# Patient Record
Sex: Female | Born: 1970 | Race: White | Hispanic: No | Marital: Married | Smoking: Never smoker
Health system: Southern US, Community
[De-identification: ages and names within clinical notes are randomized; demographics above are authoritative.]

## PROBLEM LIST (undated history)

## (undated) DIAGNOSIS — R51 Headache: Secondary | ICD-10-CM

## (undated) DIAGNOSIS — G459 Transient cerebral ischemic attack, unspecified: Secondary | ICD-10-CM

## (undated) DIAGNOSIS — I10 Essential (primary) hypertension: Secondary | ICD-10-CM

## (undated) DIAGNOSIS — G988 Other disorders of nervous system: Secondary | ICD-10-CM

## (undated) DIAGNOSIS — R569 Unspecified convulsions: Secondary | ICD-10-CM

## (undated) DIAGNOSIS — E119 Type 2 diabetes mellitus without complications: Secondary | ICD-10-CM

## (undated) DIAGNOSIS — M797 Fibromyalgia: Secondary | ICD-10-CM

## (undated) DIAGNOSIS — IMO0001 Reserved for inherently not codable concepts without codable children: Secondary | ICD-10-CM

## (undated) DIAGNOSIS — R000 Tachycardia, unspecified: Secondary | ICD-10-CM

## (undated) DIAGNOSIS — K219 Gastro-esophageal reflux disease without esophagitis: Secondary | ICD-10-CM

## (undated) DIAGNOSIS — I499 Cardiac arrhythmia, unspecified: Secondary | ICD-10-CM

## (undated) DIAGNOSIS — G43109 Migraine with aura, not intractable, without status migrainosus: Secondary | ICD-10-CM

## (undated) HISTORY — PX: TUBAL LIGATION: SHX77

## (undated) HISTORY — PX: CHOLECYSTECTOMY: SHX55

## (undated) HISTORY — PX: COLONOSCOPY: SHX174

## (undated) HISTORY — PX: OVARIAN CYST DRAINAGE: SHX325

---

## 1999-11-09 ENCOUNTER — Emergency Department (HOSPITAL_COMMUNITY): Admission: EM | Admit: 1999-11-09 | Discharge: 1999-11-09 | Payer: Self-pay | Admitting: Emergency Medicine

## 2002-09-11 ENCOUNTER — Emergency Department (HOSPITAL_COMMUNITY): Admission: EM | Admit: 2002-09-11 | Discharge: 2002-09-11 | Payer: Self-pay | Admitting: *Deleted

## 2002-09-11 ENCOUNTER — Encounter: Payer: Self-pay | Admitting: Emergency Medicine

## 2004-04-04 ENCOUNTER — Emergency Department (HOSPITAL_COMMUNITY): Admission: EM | Admit: 2004-04-04 | Discharge: 2004-04-04 | Payer: Self-pay | Admitting: Emergency Medicine

## 2006-10-22 ENCOUNTER — Ambulatory Visit: Payer: Self-pay | Admitting: Cardiology

## 2008-07-15 DIAGNOSIS — G459 Transient cerebral ischemic attack, unspecified: Secondary | ICD-10-CM

## 2008-07-15 HISTORY — DX: Transient cerebral ischemic attack, unspecified: G45.9

## 2008-09-19 ENCOUNTER — Ambulatory Visit (HOSPITAL_COMMUNITY): Admission: RE | Admit: 2008-09-19 | Discharge: 2008-09-19 | Payer: Self-pay | Admitting: Neurology

## 2008-10-21 ENCOUNTER — Ambulatory Visit: Admission: RE | Admit: 2008-10-21 | Discharge: 2008-10-21 | Payer: Self-pay | Admitting: Neurology

## 2008-11-16 ENCOUNTER — Ambulatory Visit (HOSPITAL_COMMUNITY): Admission: RE | Admit: 2008-11-16 | Discharge: 2008-11-16 | Payer: Self-pay | Admitting: Pediatrics

## 2010-11-27 NOTE — Procedures (Signed)
Erin Murray, Erin Murray                 ACCOUNT NO.:  [id number]   MEDICAL RECORD NO.:  [id number]          PATIENT TYPE:  OUT   LOCATION:  SLEEP LAB                     FACILITY:  [facility]   PHYSICIAN:  Kofi A. Gerilyn Pilgrim, M.D. DATE OF BIRTH:  1970-09-11   DATE OF STUDY:  10/21/2008                            NOCTURNAL POLYSOMNOGRAM   REFERRING PHYSICIAN:  Kofi A. Gerilyn Pilgrim, M.D.   PLACE:  [facility].   REFERRING PHYSICIAN:  Kofi A. Gerilyn Pilgrim, MD   HISTORY OF PRESENT ILLNESS:  This is a 40 year old lady who presents  with hypersomnia and snoring.  She has been evaluated for obstructive  sleep apnea syndrome.   MEDICATIONS:  Clonazepam, Ambien, Paxil, vitamin D, and Lyrica.   EPWORTH SLEEPINESS SCALE:  1. BMI 35.   ARCHITECTURAL SUMMARY:  This is a diagnostic study.  The total recording  time is 403 minutes.  The sleep efficiency is 88%, sleep latency 45  minutes, and REM latency 247 minutes.  Stage N1 4.5%, N2 57.4%, N3  23.1%, and REM sleep 15%.   RESPIRATORY SUMMARY:  The baseline oxygen saturation is 98%, lowest  oxygen saturation is 87%.  AHI 0.2.   LIMB MOVEMENT SUMMARY:  PLM index 35.1.   ELECTROCARDIOGRAM SUMMARY:  Average heart rate 84 with no significant  dysrhythmias observed.   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      Kofi A. Gerilyn Pilgrim, M.D.  Electronically Signed     KAD/MEDQ  D:  10/25/2008 08:56:04  T:  10/26/2008 07:39:44  Job:  [id number]

## 2010-11-30 NOTE — Procedures (Signed)
Erin Murray, RUMLER                 ACCOUNT NO.:  [id number]   MEDICAL RECORD NO.:  [id number]          PATIENT TYPE:  OUT   LOCATION:  RESP                          FACILITY:  [facility]   PHYSICIAN:  Kofi A. Gerilyn Pilgrim, M.D. DATE OF BIRTH:  1970/11/26   DATE OF PROCEDURE:  DATE OF DISCHARGE:                              EEG INTERPRETATION   REFERRING PHYSICIAN:  Francoise Schaumann. Halm, DO, FAAP   INDICATION:  A 40 year old lady who presents with history of seizures.   MEDICATIONS:  Paxil, Ambien, Klonopin.   ANALYSIS:  A 16-channel recording is conducted for approximately 20  minutes.  There is a well-formed posterior dominant background rhythm of  11 Hz, which attenuates with eye opening.  There is beta activity  observed in the frontal areas.  Awake activity is recorded.  There is  some drowsiness also observed.  No clear stage II sleep, however.  Photic stimulation and hyperventilation are both carried out without  significant changes in the background activity.  There is no focal or  lateralized slowing.  The patient does have a few sharp wave activity,  stage reverse at T3 associated with a field.   IMPRESSION:  Mildly abnormal recording showing a few left temporal  epileptiform discharges.  These correlate clinically with partial onset  seizures.      Kofi A. Gerilyn Pilgrim, M.D.  Electronically Signed     KAD/MEDQ  D:  11/17/2008  T:  11/18/2008  Job:  [id number]

## 2012-01-02 ENCOUNTER — Other Ambulatory Visit (HOSPITAL_COMMUNITY): Payer: Self-pay | Admitting: Physician Assistant

## 2012-01-02 DIAGNOSIS — Z139 Encounter for screening, unspecified: Secondary | ICD-10-CM

## 2012-01-06 ENCOUNTER — Ambulatory Visit (HOSPITAL_COMMUNITY): Payer: Self-pay

## 2012-01-20 ENCOUNTER — Ambulatory Visit (HOSPITAL_COMMUNITY)
Admission: RE | Admit: 2012-01-20 | Discharge: 2012-01-20 | Disposition: A | Discharge status: Home / Self Care | Payer: PRIVATE HEALTH INSURANCE | Source: Ambulatory Visit | Attending: Physician Assistant | Admitting: Physician Assistant

## 2012-01-20 DIAGNOSIS — Z139 Encounter for screening, unspecified: Secondary | ICD-10-CM

## 2012-02-04 ENCOUNTER — Other Ambulatory Visit: Payer: Self-pay | Admitting: Physician Assistant

## 2012-02-04 DIAGNOSIS — R928 Other abnormal and inconclusive findings on diagnostic imaging of breast: Secondary | ICD-10-CM

## 2012-02-12 ENCOUNTER — Other Ambulatory Visit: Payer: Self-pay | Admitting: Physician Assistant

## 2012-02-12 ENCOUNTER — Ambulatory Visit (HOSPITAL_COMMUNITY)
Admission: RE | Admit: 2012-02-12 | Discharge: 2012-02-12 | Disposition: A | Discharge status: Home / Self Care | Payer: Self-pay | Source: Ambulatory Visit | Attending: Physician Assistant | Admitting: Physician Assistant

## 2012-02-12 DIAGNOSIS — R928 Other abnormal and inconclusive findings on diagnostic imaging of breast: Secondary | ICD-10-CM

## 2012-02-20 ENCOUNTER — Emergency Department (HOSPITAL_COMMUNITY)
Admission: EM | Admit: 2012-02-20 | Discharge: 2012-02-20 | Disposition: A | Discharge status: Home / Self Care | Payer: Self-pay | Attending: Emergency Medicine | Admitting: Emergency Medicine

## 2012-02-20 ENCOUNTER — Encounter (HOSPITAL_COMMUNITY): Payer: Self-pay | Admitting: *Deleted

## 2012-02-20 DIAGNOSIS — R531 Weakness: Secondary | ICD-10-CM

## 2012-02-20 DIAGNOSIS — R29898 Other symptoms and signs involving the musculoskeletal system: Secondary | ICD-10-CM | POA: Insufficient documentation

## 2012-02-20 DIAGNOSIS — Z8673 Personal history of transient ischemic attack (TIA), and cerebral infarction without residual deficits: Secondary | ICD-10-CM | POA: Insufficient documentation

## 2012-02-20 DIAGNOSIS — K219 Gastro-esophageal reflux disease without esophagitis: Secondary | ICD-10-CM | POA: Insufficient documentation

## 2012-02-20 HISTORY — DX: Transient cerebral ischemic attack, unspecified: G45.9

## 2012-02-20 HISTORY — DX: Unspecified convulsions: R56.9

## 2012-02-20 HISTORY — DX: Headache: R51

## 2012-02-20 HISTORY — DX: Reserved for inherently not codable concepts without codable children: IMO0001

## 2012-02-20 HISTORY — DX: Gastro-esophageal reflux disease without esophagitis: K21.9

## 2012-02-20 HISTORY — DX: Fibromyalgia: M79.7

## 2012-02-20 NOTE — ED Notes (Signed)
Pt has lt facial droop, says her lt arm "jerks" and weakness of lt leg.  Alert, talking, Has had similar episodes for several years.  This episode started yesterday 7pm while in church. Had been dx with migraine or TI A

## 2012-02-20 NOTE — ED Provider Notes (Signed)
History  This chart was scribed for Erin Hutching, MD by Bennett Scrape. This patient was seen in room [facility] and the patient's care was started at 4:08PM.  CSN: [id number]  Arrival date & time 02/20/12  1534   First MD Initiated Contact with Patient 02/20/12 1608      Chief Complaint  Patient presents with  . Extremity Weakness    The history is provided by the patient. No language interpreter was used.    Erin Murray is a 41 y.o. female who presents to the Emergency Department complaining of 21 hours of gradual onset, non-changing, constant left arm "jerking" with associated left leg weakness that started while she was in church. She states that she is able to walk but feels like she is dragging her left leg. She also states that the left side of her face feels tight and the right side feels "limp" but she denies facial drooping. She reports that she has been experiencing similar episodes for several years but states that they usually resolve on their own after a few hours. She states that she has been seen by several neurologists and reports that her symptoms have been diagnosed as seizures, TIAs and migraines. She states that she does have a h/o seizures but denies similarities between the seizures and the symptoms she is experiencing now. She also reports having 10 negative CT scans since the onset of the symptoms several years ago. She denies having any modifying factors. She denies fever, sore throat, visual disturbance, CP, SOB, abdominal pain, nausea, emesis, diarrhea, urinary symptoms, back pain, HA, numbness and rash as associated symptoms. She denies smoking and alcohol use.   PCP is [facility].   Past Medical History  Diagnosis Date  . Seizures   . Reflux   . Fibromyalgia   . Headache   . TIA (transient ischemic attack)     Past Surgical History  Procedure Date  . Ovarian cyst drainage   . Tubal ligation     History reviewed. No pertinent family  history.  History  Substance Use Topics  . Smoking status: Never Smoker   . Smokeless tobacco: Not on file  . Alcohol Use: No    No OB history provided.  Review of Systems  A complete 10 system review of systems was obtained and all systems are negative except as noted in the HPI and PMH.   Allergies  Penicillins and Sulfa antibiotics  Home Medications  No current outpatient prescriptions on file.  Triage Vitals: BP 134/84  Pulse 85  Temp 98.2 F (36.8 C) (Oral)  Resp 20  Ht 5\' 5"  (1.651 m)  Wt 220 lb (99.791 kg)  BMI 36.61 kg/m2  SpO2 100%  LMP 01/30/2012  Physical Exam  Nursing note and vitals reviewed. Constitutional: She is oriented to person, place, and time. She appears well-developed and well-nourished. No distress.  HENT:  Head: Normocephalic and atraumatic.  Eyes: Conjunctivae and EOM are normal.  Neck: Neck supple. No tracheal deviation present.  Cardiovascular: Normal rate and regular rhythm.   Pulmonary/Chest: Effort normal and breath sounds normal. No respiratory distress.  Musculoskeletal: Normal range of motion.  Neurological: She is alert and oriented to person, place, and time.       Normal gait, no facial droop  Skin: Skin is warm and dry.  Psychiatric: She has a normal mood and affect. Her behavior is normal.    ED Course  Procedures (including critical care time)  DIAGNOSTIC STUDIES: Oxygen  Saturation is 100% on room air, normal by my interpretation.    COORDINATION OF CARE: 4:17PM-Discussed treatment plan which includes a referral to Dr. Gerilyn Pilgrim with pt at bedside and pt agreed to plan. Advised pt that she needs to follow up with Dr. Gerilyn Pilgrim to further investigate her symptoms   Labs Reviewed - No data to display No results found.   No diagnosis found.    MDM  Patient has had these symptoms off and on for 10 years with multiple negative scans. She is ambulatory today. I discussed the risk in repetitive head scans. She  understands. We'll follow up c [facility]. Is alert, oriented, no acute findings on physical exam  I personally performed the services described in this documentation, which was scribed in my presence. The recorded information has been reviewed and considered.        Erin Hutching, MD 02/20/12 1728

## 2012-06-05 ENCOUNTER — Emergency Department (HOSPITAL_COMMUNITY)
Admission: EM | Admit: 2012-06-05 | Discharge: 2012-06-05 | Disposition: A | Discharge status: Home / Self Care | Payer: Self-pay | Attending: Emergency Medicine | Admitting: Emergency Medicine

## 2012-06-05 ENCOUNTER — Emergency Department (HOSPITAL_COMMUNITY): Payer: Self-pay

## 2012-06-05 ENCOUNTER — Encounter (HOSPITAL_COMMUNITY): Payer: Self-pay | Admitting: *Deleted

## 2012-06-05 DIAGNOSIS — W1789XA Other fall from one level to another, initial encounter: Secondary | ICD-10-CM | POA: Insufficient documentation

## 2012-06-05 DIAGNOSIS — Y9239 Other specified sports and athletic area as the place of occurrence of the external cause: Secondary | ICD-10-CM | POA: Insufficient documentation

## 2012-06-05 DIAGNOSIS — S4352XA Sprain of left acromioclavicular joint, initial encounter: Secondary | ICD-10-CM

## 2012-06-05 DIAGNOSIS — Y9344 Activity, trampolining: Secondary | ICD-10-CM | POA: Insufficient documentation

## 2012-06-05 DIAGNOSIS — K219 Gastro-esophageal reflux disease without esophagitis: Secondary | ICD-10-CM | POA: Insufficient documentation

## 2012-06-05 DIAGNOSIS — S63502A Unspecified sprain of left wrist, initial encounter: Secondary | ICD-10-CM

## 2012-06-05 DIAGNOSIS — Z79899 Other long term (current) drug therapy: Secondary | ICD-10-CM | POA: Insufficient documentation

## 2012-06-05 DIAGNOSIS — S4350XA Sprain of unspecified acromioclavicular joint, initial encounter: Secondary | ICD-10-CM | POA: Insufficient documentation

## 2012-06-05 DIAGNOSIS — G459 Transient cerebral ischemic attack, unspecified: Secondary | ICD-10-CM | POA: Insufficient documentation

## 2012-06-05 DIAGNOSIS — S63509A Unspecified sprain of unspecified wrist, initial encounter: Secondary | ICD-10-CM | POA: Insufficient documentation

## 2012-06-05 MED ORDER — IBUPROFEN 800 MG PO TABS
800.0000 mg | ORAL_TABLET | Freq: Once | ORAL | Status: AC
  Administered 2012-06-05: 800 mg via ORAL
  Filled 2012-06-05: qty 1

## 2012-06-05 NOTE — ED Notes (Signed)
Pt presents after falling from trampoline this evening, causing pain to left wrist, left shoulder and neck. Pt denies LOC.  No deformities noted.

## 2012-06-05 NOTE — ED Notes (Signed)
Fell off trampoline, Pain , neck, lt shoulder and lt arm.  No loc.  Ambulatory.

## 2012-06-05 NOTE — ED Provider Notes (Signed)
History     CSN: [id number]  Arrival date & time 06/05/12  1741   First MD Initiated Contact with Patient 06/05/12 1835      Chief Complaint  Patient presents with  . Fall    (Consider location/radiation/quality/duration/timing/severity/associated sxs/prior treatment) Patient is a 41 y.o. female presenting with fall. The history is provided by the patient. No language interpreter was used.  Fall The accident occurred 1 to 2 hours ago. The fall occurred while recreating/playing (pt fell while trying to get down from the trampoline in  her yard.  fell onto her L side). Distance fallen: ~ 3 feet. She landed on grass. There was no blood loss. The point of impact was the left shoulder and left wrist. The pain is present in the left shoulder and left wrist.    Past Medical History  Diagnosis Date  . Reflux   . Fibromyalgia   . Headache   . TIA (transient ischemic attack)   . Seizures     Past Surgical History  Procedure Date  . Ovarian cyst drainage   . Tubal ligation     History reviewed. No pertinent family history.  History  Substance Use Topics  . Smoking status: Never Smoker   . Smokeless tobacco: Not on file  . Alcohol Use: No    OB History    Grav Para Term Preterm Abortions TAB SAB Ect Mult Living                  Review of Systems  Allergies  Sulfa antibiotics and Penicillins  Home Medications   Current Outpatient Rx  Name  Route  Sig  Dispense  Refill  . VITAMIN D3 10000 UNITS PO CAPS   Oral   Take 20,000 Units by mouth once a week.         . CYANOCOBALAMIN 1000 MCG/ML IJ SOLN   Intramuscular   Inject 1,000 mcg into the muscle.         Marland Kitchen OMEPRAZOLE 20 MG PO CPDR   Oral   Take 20 mg by mouth at bedtime.         Marland Kitchen PREGABALIN 50 MG PO CAPS   Oral   Take 50 mg by mouth at bedtime. PRESCRIBED: Take one capsule by mouth twice daily         . PROPRANOLOL HCL 20 MG PO TABS   Oral   Take 20 mg by mouth at bedtime.           BP  130/85  Pulse 88  Temp 98.7 F (37.1 C) (Oral)  Resp 18  Ht 5\' 5"  (1.651 m)  Wt 214 lb (97.07 kg)  BMI 35.61 kg/m2  SpO2 100%  LMP 05/15/2012  Physical Exam  Musculoskeletal:       Left shoulder: She exhibits decreased range of motion, tenderness, bony tenderness and pain. She exhibits no swelling, no effusion, no crepitus, no deformity and normal pulse.       Left wrist: She exhibits decreased range of motion, tenderness and bony tenderness. She exhibits no swelling, no effusion, no crepitus, no deformity and no laceration.       Arms:   ED Course  Procedures (including critical care time)  Labs Reviewed - No data to display Dg Wrist Complete Left  06/05/2012  *RADIOLOGY REPORT*  Clinical Data: Larey Seat  LEFT WRIST - COMPLETE 3+ VIEW  Comparison: 10/18/2011  Findings: Carpal rows intact. Negative for fracture, dislocation, or other acute abnormality.  Normal alignment  and mineralization. No significant degenerative change.  Regional soft tissues unremarkable.  IMPRESSION:  Negative   Original Report Authenticated By: D. Andria Rhein, MD    Dg Shoulder Left  06/05/2012  *RADIOLOGY REPORT*  Clinical Data: Fall from trampoline with left shoulder injury.  LEFT SHOULDER - 2+ VIEW  Comparison: None.  Findings: No acute fracture or dislocation identified.  There is suggestion of mildly increased coracoclavicular distance without overt AC joint separation.  This may be consistent with a subtle sprain.  Mild proliferative changes are seen involving the acromion.  IMPRESSION: Possible subtle AC joint sprain.  Otherwise no evidence of acute fracture.   Original Report Authenticated By: Irish Lack, M.D.      1. Sprain of left acromioclavicular joint   2. Left wrist sprain       MDM  Wrist splint, sling, ice, ibuprofen. Pt states she will f/u with an orthopedist that her family uses.        Evalina Field, [place] 06/05/12 2012

## 2012-06-05 NOTE — ED Provider Notes (Signed)
Medical screening examination/treatment/procedure(s) were performed by non-physician practitioner and as supervising physician I was immediately available for consultation/collaboration.   Shelda Jakes, MD 06/05/12 2032

## 2012-11-02 ENCOUNTER — Ambulatory Visit: Payer: Self-pay | Admitting: Neurology

## 2012-11-03 ENCOUNTER — Ambulatory Visit: Payer: BC Managed Care – PPO | Admitting: Neurology

## 2012-11-17 ENCOUNTER — Encounter: Payer: Self-pay | Admitting: Neurology

## 2012-11-17 ENCOUNTER — Ambulatory Visit (INDEPENDENT_AMBULATORY_CARE_PROVIDER_SITE_OTHER): Payer: BC Managed Care – PPO | Admitting: Neurology

## 2012-11-17 VITALS — BP 123/82 | HR 83 | Ht 65.0 in | Wt 219.0 lb

## 2012-11-17 DIAGNOSIS — M797 Fibromyalgia: Secondary | ICD-10-CM

## 2012-11-17 DIAGNOSIS — K219 Gastro-esophageal reflux disease without esophagitis: Secondary | ICD-10-CM

## 2012-11-17 DIAGNOSIS — R519 Headache, unspecified: Secondary | ICD-10-CM | POA: Insufficient documentation

## 2012-11-17 DIAGNOSIS — G459 Transient cerebral ischemic attack, unspecified: Secondary | ICD-10-CM | POA: Insufficient documentation

## 2012-11-17 DIAGNOSIS — IMO0001 Reserved for inherently not codable concepts without codable children: Secondary | ICD-10-CM

## 2012-11-17 DIAGNOSIS — R51 Headache: Secondary | ICD-10-CM | POA: Insufficient documentation

## 2012-11-17 DIAGNOSIS — G43909 Migraine, unspecified, not intractable, without status migrainosus: Secondary | ICD-10-CM | POA: Insufficient documentation

## 2012-11-17 DIAGNOSIS — R569 Unspecified convulsions: Secondary | ICD-10-CM

## 2012-11-17 NOTE — Progress Notes (Signed)
42 yo right-handed Caucasian female, accompanied by her husband, referred by her primary care physician Dr. Quentin Mulling for evaluation of constellation of complaints.  In 2010, she had episode of confusion, got lost in her familiar route, run out of gas, she was also found to have slurred speech, lasting for 3-4 hours mild unsteady gait was diagnosed with TIA.   She also had episode of left-sided weakness, numbness, mild gait difficulty dizziness in 2010, has to use cane for few days.   in March 2014, she had sudden onset left-sided numbness, involving her face, body, arm, leg, also has mild left side weakness, gait difficulty lasting for more than 24 hours  She had history of migraine since 2005, see flashing light in the right visual field sometimes, followed by severe pounding headaches with associated light noise sensitivity, nauseous.   She compalins of  extreme faigue,   urinary incontinence for one year, she wet her patns few times,she has sharp pain at the ball of her feet.  She has shoulder blader stabbing pain.   Review of Systems  Out of a complete 14 system review, the patient complains of only the following symptoms, and all other reviewed systems are negative.   Constitutional:   Fatigue Cardiovascular:  N/A Ear/Nose/Throat:  N/A Skin: N/A Eyes: N/A Respiratory: N/A Gastroitestinal: N/A    Hematology/Lymphatic:  N/A Endocrine:  N/A Musculoskeletal: Joints pain, joint swelling, cramps, aching muscles Allergy/Immunology: N/A Neurological: Memory loss, confusion, headaches, numbness, weakness, slurred speech difficulty swallowing dizziness seizure, tremor  Psychiatric:    N/A  PHYSICAL EXAMINATOINS:  Generalized: In no acute distress  Neck: Supple, no carotid bruits   Cardiac: Regular rate rhythm  Pulmonary: Clear to auscultation bilaterally  Musculoskeletal: No deformity  Neurological examination  Mentation: Alert oriented to time, place, history taking, and  causual conversation  Cranial nerve II-XII: Pupils were equal round reactive to light extraocular movements were full, visual field were full on confrontational test. facial sensation and strength were normal. hearing was intact to finger rubbing bilaterally. Uvula tongue midline.  head turning and shoulder shrug and were normal and symmetric.Tongue protrusion into cheek strength was normal.  Motor: mild left arm proximal arm weakness 5-/5, left hip flexion, left knee extension 5-  Sensory: Intact to fine touch, pinprick, preserved vibratory sensation, and proprioception at toes.  Coordination: Normal finger to nose, heel-to-shin bilaterally there was no truncal ataxia  Gait: Rising up from seated position without assistance, normal stance, without trunk ataxia, moderate stride, good arm swing, smooth turning, able to perform tiptoe, and heel walking without difficulty.   Romberg signs: Negative  Deep tendon reflexes: Brachioradialis 2/2, biceps 2/2, triceps 2/2, patellar 2/2, Achilles 2/2, plantar responses were flexor bilaterally.  A/P: 42 yo RH WF with constellation of neurological symptoms, including dizziness, left-sided numbness, weakness  1. Need to rule out central nervous system structural lesion, MRI of the brain without contrast 2. laboratory evaluation. 3. return to clinic in one month.

## 2012-11-18 LAB — COMPREHENSIVE METABOLIC PANEL
Albumin/Globulin Ratio: 1.6 (ref 1.1–2.5)
Calcium: 9.4 mg/dL (ref 8.7–10.2)
Creatinine, Ser: 0.69 mg/dL (ref 0.57–1.00)
GFR calc Af Amer: 124 mL/min/{1.73_m2} (ref >59)
GFR calc non Af Amer: 108 mL/min/{1.73_m2} (ref >59)
Globulin, Total: 2.7 g/dL (ref 1.5–4.5)
Glucose: 89 mg/dL (ref 65–99)
Total Bilirubin: 0.1 mg/dL (ref 0.0–1.2)
Total Protein: 7 g/dL (ref 6.0–8.5)

## 2012-11-18 LAB — RPR: RPR: NONREACTIVE

## 2012-11-18 LAB — CBC
Hemoglobin: 12.6 g/dL (ref 11.1–15.9)
MCV: 82 fL (ref 79–97)
Platelets: 341 10*3/uL (ref 155–379)
RBC: 4.68 x10E6/uL (ref 3.77–5.28)
WBC: 7.5 10*3/uL (ref 3.4–10.8)

## 2012-11-18 LAB — HEPATITIS PANEL, ACUTE
Hep A IgM: NEGATIVE
Hep B C IgM: NEGATIVE
Hepatitis B Surface Ag: NEGATIVE

## 2012-11-18 LAB — TSH: TSH: 2.75 u[IU]/mL (ref 0.450–4.500)

## 2012-11-18 LAB — C-REACTIVE PROTEIN: CRP: 5 mg/L — ABNORMAL HIGH (ref 0.0–4.9)

## 2012-12-21 ENCOUNTER — Ambulatory Visit: Payer: BC Managed Care – PPO | Admitting: Neurology

## 2012-12-24 ENCOUNTER — Ambulatory Visit (INDEPENDENT_AMBULATORY_CARE_PROVIDER_SITE_OTHER): Payer: BC Managed Care – PPO

## 2012-12-24 DIAGNOSIS — G459 Transient cerebral ischemic attack, unspecified: Secondary | ICD-10-CM

## 2012-12-24 DIAGNOSIS — R51 Headache: Secondary | ICD-10-CM

## 2012-12-24 DIAGNOSIS — G43909 Migraine, unspecified, not intractable, without status migrainosus: Secondary | ICD-10-CM

## 2012-12-24 DIAGNOSIS — IMO0001 Reserved for inherently not codable concepts without codable children: Secondary | ICD-10-CM

## 2012-12-24 DIAGNOSIS — M797 Fibromyalgia: Secondary | ICD-10-CM

## 2012-12-24 DIAGNOSIS — R569 Unspecified convulsions: Secondary | ICD-10-CM

## 2012-12-25 MED ORDER — GADOPENTETATE DIMEGLUMINE 469.01 MG/ML IV SOLN
20.0000 mL | Freq: Once | INTRAVENOUS | Status: AC | PRN
Start: 2012-12-25 — End: 2012-12-25

## 2012-12-28 ENCOUNTER — Telehealth: Payer: Self-pay | Admitting: *Deleted

## 2012-12-28 NOTE — Progress Notes (Signed)
Quick Note:  Spoke with patient and relayed the results of their MRI. The patient was also reminded of any future appointments. The patient understood and had no questions.  ______ 

## 2012-12-28 NOTE — Telephone Encounter (Signed)
Spoke with patient and relayed the results of their MRI.  The patient was also reminded of any future appointments.  The patient understood and had no questions.

## 2012-12-30 ENCOUNTER — Ambulatory Visit (INDEPENDENT_AMBULATORY_CARE_PROVIDER_SITE_OTHER): Payer: BC Managed Care – PPO | Admitting: Neurology

## 2012-12-30 ENCOUNTER — Encounter: Payer: Self-pay | Admitting: Neurology

## 2012-12-30 VITALS — BP 114/79 | HR 95 | Ht 65.0 in | Wt 215.0 lb

## 2012-12-30 DIAGNOSIS — R569 Unspecified convulsions: Secondary | ICD-10-CM

## 2012-12-30 DIAGNOSIS — R51 Headache: Secondary | ICD-10-CM

## 2012-12-30 DIAGNOSIS — G43909 Migraine, unspecified, not intractable, without status migrainosus: Secondary | ICD-10-CM

## 2012-12-30 MED ORDER — RIZATRIPTAN BENZOATE 10 MG PO TBDP: 10.0000 mg | ORAL_TABLET | ORAL | Status: DC | PRN

## 2012-12-30 MED ORDER — TOPIRAMATE 50 MG PO TABS: 100.0000 mg | ORAL_TABLET | Freq: Two times a day (BID) | ORAL | Status: DC

## 2012-12-30 NOTE — Progress Notes (Signed)
42 yo right-handed Caucasian female, accompanied by her husband, referred by her primary care physician Dr. Quentin Mulling for evaluation of constellation of complaints.  In 2010, she had episode of confusion, got lost in her familiar route, run out of gas, she was also found to have slurred speech, lasting for 3-4 hours mild unsteady gait was diagnosed with TIA.   She also had episode of left-sided weakness, numbness, mild gait difficulty dizziness in 2010, has to use cane for few days.   in March 2014, she had sudden onset left-sided numbness, involving her face, body, arm, leg, also has mild left side weakness, gait difficulty lasting for more than 24 hours  She had history of migraine since 2005, see flashing light in the right visual field sometimes, followed by severe pounding headaches with associated light noise sensitivity, nauseous.   She compalins of  extreme faigue,   urinary incontinence for one year, she wet her patns few times,she has sharp pain at the ball of her feet.  She has shoulder blader stabbing pain.   UPDATE June 18th 2014:  She continues to have spells, most of the time, it is associated with headaches, left side numbness, occasionally weakness, each episodes lasting for 1-2 days,  She has headaches at least once every 2 weeks, she sees sparks in her visual field, flashing light in her right visual field, lasting one hour, followed by severe pounding headaches, mostly at right side parietal, occipital region, she drags her left side, with left side numbness,   Trigger for her headaches are menstruation, stress.  She was given imitrex prn for headaches, it took away headaches, but it makes her sick, nauseous,   MRI of the brain was normal, laboratory showed a normal CMP, TSH, CBC, vitamin B12, ANA, negative RPR  Review of Systems  Out of a complete 14 system review, the patient complains of only the following symptoms, and all other reviewed systems are negative.    Constitutional:   Fatigue Cardiovascular:  N/A Ear/Nose/Throat:  Ringing in ear, spinning sensation Skin: N/A Eyes: N/A Respiratory: N/A Gastroitestinal: N/A    Hematology/Lymphatic:  N/A Endocrine:  N/A Musculoskeletal: Joints pain, joint swelling, cramps, aching muscles Allergy/Immunology: N/A Neurological: Memory loss, confusion, headaches, numbness, weakness, slurred speech difficulty swallowing dizziness seizure, tremor  Psychiatric:    N/A  PHYSICAL EXAMINATOINS:  Generalized: In no acute distress  Neck: Supple, no carotid bruits   Cardiac: Regular rate rhythm  Pulmonary: Clear to auscultation bilaterally  Musculoskeletal: No deformity  Neurological examination  Mentation: Alert oriented to time, place, history taking, and causual conversation  Cranial nerve II-XII: Pupils were equal round reactive to light extraocular movements were full, visual field were full on confrontational test. facial sensation and strength were normal. hearing was intact to finger rubbing bilaterally. Uvula tongue midline.  head turning and shoulder shrug and were normal and symmetric.Tongue protrusion into cheek strength was normal.  Motor: mild left arm proximal arm weakness 5-/5, left hip flexion, left knee extension 5-  Sensory: Intact to fine touch, pinprick, preserved vibratory sensation, and proprioception at toes.  Coordination: Normal finger to nose, heel-to-shin bilaterally there was no truncal ataxia  Gait: Rising up from seated position without assistance, normal stance, without trunk ataxia, moderate stride, good arm swing, smooth turning, able to perform tiptoe, and heel walking without difficulty.   Romberg signs: Negative  Deep tendon reflexes: Brachioradialis 2/2, biceps 2/2, triceps 2/2, patellar 2/2, Achilles 2/2, plantar responses were flexor bilaterally.  A/P: 42 yo  RH WF with constellation of neurological symptoms, including dizziness, left-sided numbness,  weakness, most time her symptoms are associated with headaches, normal neurological examination, normal MRI of the brain,  1. Most consistent with a complicated migraine headaches 2. Topamax 50 mg titrating to 2 tablets twice a day 3. Maxalt as needed 4.  return to clinic in 4 months with Eber Jones .

## 2013-06-01 ENCOUNTER — Ambulatory Visit: Payer: BC Managed Care – PPO | Admitting: Neurology

## 2013-10-11 ENCOUNTER — Encounter (HOSPITAL_COMMUNITY): Payer: Self-pay | Admitting: Emergency Medicine

## 2013-10-11 ENCOUNTER — Emergency Department (HOSPITAL_COMMUNITY)
Admission: EM | Admit: 2013-10-11 | Discharge: 2013-10-11 | Disposition: A | Discharge status: Home / Self Care | Payer: BC Managed Care – PPO | Attending: Emergency Medicine | Admitting: Emergency Medicine

## 2013-10-11 ENCOUNTER — Emergency Department (HOSPITAL_COMMUNITY): Payer: BC Managed Care – PPO

## 2013-10-11 DIAGNOSIS — G43909 Migraine, unspecified, not intractable, without status migrainosus: Secondary | ICD-10-CM | POA: Insufficient documentation

## 2013-10-11 DIAGNOSIS — Z880 Allergy status to penicillin: Secondary | ICD-10-CM | POA: Insufficient documentation

## 2013-10-11 DIAGNOSIS — Z79899 Other long term (current) drug therapy: Secondary | ICD-10-CM | POA: Insufficient documentation

## 2013-10-11 DIAGNOSIS — R5383 Other fatigue: Secondary | ICD-10-CM

## 2013-10-11 DIAGNOSIS — K219 Gastro-esophageal reflux disease without esophagitis: Secondary | ICD-10-CM | POA: Insufficient documentation

## 2013-10-11 DIAGNOSIS — F411 Generalized anxiety disorder: Secondary | ICD-10-CM | POA: Insufficient documentation

## 2013-10-11 DIAGNOSIS — G40909 Epilepsy, unspecified, not intractable, without status epilepticus: Secondary | ICD-10-CM | POA: Insufficient documentation

## 2013-10-11 DIAGNOSIS — Z8739 Personal history of other diseases of the musculoskeletal system and connective tissue: Secondary | ICD-10-CM | POA: Insufficient documentation

## 2013-10-11 DIAGNOSIS — R5381 Other malaise: Secondary | ICD-10-CM | POA: Insufficient documentation

## 2013-10-11 DIAGNOSIS — Z8673 Personal history of transient ischemic attack (TIA), and cerebral infarction without residual deficits: Secondary | ICD-10-CM | POA: Insufficient documentation

## 2013-10-11 HISTORY — DX: Migraine with aura, not intractable, without status migrainosus: G43.109

## 2013-10-11 LAB — BASIC METABOLIC PANEL
BUN: 16 mg/dL (ref 6–23)
CHLORIDE: 104 meq/L (ref 96–112)
CO2: 23 meq/L (ref 19–32)
CREATININE: 0.78 mg/dL (ref 0.50–1.10)
Calcium: 9.5 mg/dL (ref 8.4–10.5)
GFR calc non Af Amer: >90 mL/min (ref >90)
Glucose, Bld: 87 mg/dL (ref 70–99)
POTASSIUM: 3.9 meq/L (ref 3.7–5.3)
Sodium: 140 mEq/L (ref 137–147)

## 2013-10-11 LAB — CBC WITH DIFFERENTIAL/PLATELET
BASOS ABS: 0 10*3/uL (ref 0.0–0.1)
Basophils Relative: 0 % (ref 0–1)
Eosinophils Absolute: 0.1 10*3/uL (ref 0.0–0.7)
Eosinophils Relative: 1 % (ref 0–5)
HEMATOCRIT: 40.2 % (ref 36.0–46.0)
HEMOGLOBIN: 13 g/dL (ref 12.0–15.0)
LYMPHS PCT: 29 % (ref 12–46)
Lymphs Abs: 2.4 10*3/uL (ref 0.7–4.0)
MCH: 26.3 pg (ref 26.0–34.0)
MCHC: 32.3 g/dL (ref 30.0–36.0)
MCV: 81.2 fL (ref 78.0–100.0)
MONO ABS: 0.5 10*3/uL (ref 0.1–1.0)
MONOS PCT: 7 % (ref 3–12)
NEUTROS ABS: 5.2 10*3/uL (ref 1.7–7.7)
NEUTROS PCT: 63 % (ref 43–77)
Platelets: 379 10*3/uL (ref 150–400)
RBC: 4.95 MIL/uL (ref 3.87–5.11)
RDW: 14.2 % (ref 11.5–15.5)
WBC: 8.2 10*3/uL (ref 4.0–10.5)

## 2013-10-11 LAB — TROPONIN I

## 2013-10-11 LAB — CBG MONITORING, ED: GLUCOSE-CAPILLARY: 70 mg/dL (ref 70–99)

## 2013-10-11 MED ORDER — SODIUM CHLORIDE 0.9 % IV SOLN: INTRAVENOUS | Status: DC

## 2013-10-11 MED ORDER — SODIUM CHLORIDE 0.9 % IV BOLUS (SEPSIS)
2000.0000 mL | Freq: Once | INTRAVENOUS | Status: AC
Start: 2013-10-11 — End: 2013-10-11
  Administered 2013-10-11: 2000 mL via INTRAVENOUS

## 2013-10-11 NOTE — Discharge Instructions (Signed)
Follow up with your PCP, and your neurologist for further evaluation and treatment. Try to get plenty of rest, drink a lot of fluid, and eat plenty of food.   Fatigue Fatigue is a feeling of tiredness, lack of energy, lack of motivation, or feeling tired all the time. Having enough rest, good nutrition, and reducing stress will normally reduce fatigue. Consult your caregiver if it persists. The nature of your fatigue will help your caregiver to find out its cause. The treatment is based on the cause.  CAUSES  There are many causes for fatigue. Most of the time, fatigue can be traced to one or more of your habits or routines. Most causes fit into one or more of three general areas. They are: Lifestyle problems  Sleep disturbances.  Overwork.  Physical exertion.  Unhealthy habits.  Poor eating habits or eating disorders.  Alcohol and/or drug use .  Lack of proper nutrition (malnutrition). Psychological problems  Stress and/or anxiety problems.  Depression.  Grief.  Boredom. Medical Problems or Conditions  Anemia.  Pregnancy.  Thyroid gland problems.  Recovery from major surgery.  Continuous pain.  Emphysema or asthma that is not well controlled  Allergic conditions.  Diabetes.  Infections (such as mononucleosis).  Obesity.  Sleep disorders, such as sleep apnea.  Heart failure or other heart-related problems.  Cancer.  Kidney disease.  Liver disease.  Effects of certain medicines such as antihistamines, cough and cold remedies, prescription pain medicines, heart and blood pressure medicines, drugs used for treatment of cancer, and some antidepressants. SYMPTOMS  The symptoms of fatigue include:   Lack of energy.  Lack of drive (motivation).  Drowsiness.  Feeling of indifference to the surroundings. DIAGNOSIS  The details of how you feel help guide your caregiver in finding out what is causing the fatigue. You will be asked about your present  and past health condition. It is important to review all medicines that you take, including prescription and non-prescription items. A thorough exam will be done. You will be questioned about your feelings, habits, and normal lifestyle. Your caregiver may suggest blood tests, urine tests, or other tests to look for common medical causes of fatigue.  TREATMENT  Fatigue is treated by correcting the underlying cause. For example, if you have continuous pain or depression, treating these causes will improve how you feel. Similarly, adjusting the dose of certain medicines will help in reducing fatigue.  HOME CARE INSTRUCTIONS   Try to get the required amount of good sleep every night.  Eat a healthy and nutritious diet, and drink enough water throughout the day.  Practice ways of relaxing (including yoga or meditation).  Exercise regularly.  Make plans to change situations that cause stress. Act on those plans so that stresses decrease over time. Keep your work and personal routine reasonable.  Avoid street drugs and minimize use of alcohol.  Start taking a daily multivitamin after consulting your caregiver. SEEK MEDICAL CARE IF:   You have persistent tiredness, which cannot be accounted for.  You have fever.  You have unintentional weight loss.  You have headaches.  You have disturbed sleep throughout the night.  You are feeling sad.  You have constipation.  You have dry skin.  You have gained weight.  You are taking any new or different medicines that you suspect are causing fatigue.  You are unable to sleep at night.  You develop any unusual swelling of your legs or other parts of your body. [place]  IF:   You are feeling confused.  Your vision is blurred.  You feel faint or pass out.  You develop severe headache.  You develop severe abdominal, pelvic, or back pain.  You develop chest pain, shortness of breath, or an irregular or fast  heartbeat.  You are unable to pass a normal amount of urine.  You develop abnormal bleeding such as bleeding from the rectum or you vomit blood.  You have thoughts about harming yourself or committing suicide.  You are worried that you might harm someone else. MAKE SURE YOU:   Understand these instructions.  Will watch your condition.  Will get help right away if you are not doing well or get worse. Document Released: 04/28/2007 Document Revised: 09/23/2011 Document Reviewed: 04/28/2007 [facility] Patient Information 2014 [place].

## 2013-10-11 NOTE — ED Notes (Signed)
Pt ambulated approximately 40 ft in hallway with slow, steady gait assisted x 1. Pt reported feeling "dizzy" and bilateral leg weakness.

## 2013-10-11 NOTE — ED Provider Notes (Signed)
CSN: [id number]     Arrival date & time 10/11/13  1731 History  This chart was scribed for Richarda Blade, MD by Zettie Pho, ED Scribe. This patient was seen in room APA11/APA11 and the patient's care was started at 8:31 PM.    Chief Complaint  Patient presents with  . Fatigue   The history is provided by the patient. No language interpreter was used.   HPI Comments: Erin Murray is a 43 y.o. Female with a history of fibromyalgia, seizures, TIA, and complicated migraines (visual changes, weakness, dizziness, confusion) who presents to the Emergency Department complaining of fatigue with associated diffuse weakness, worse in the bilateral legs, and tremors that she states has been ongoing for the past month, but has been progressively worsening today. She reports associated difficulty walking, described as "staggering and running into things." Patient states that earlier today she began "randomly dropping" while trying to stand. Patient reports a history of similar symptoms about 5 years ago that she states also lasted about a month. She reports that she followed up with a neurologist in [place] at that time and received an MRI due to concern for MS, which was negative. Patient reports a history of hospitalization at [facility] for similar complaints with some chest pain and received a cardiac work-up that was normal, but patient states that she did not have any relief of her neurological symptoms. Patient has allergies to sulfa antibiotics, penicillins, and IV dye. Patient also has a history of reflux.   PCP- at [facility]   Past Medical History  Diagnosis Date  . Reflux   . Fibromyalgia   . Headache(784.0)   . TIA (transient ischemic attack) 2010  . Seizures   . Fibromyalgia   . Complicated migraine     includes: visual changes, left sided weakness, confusion, weakness, dizziness   Past Surgical History  Procedure Laterality Date  . Ovarian cyst drainage    . Tubal ligation      Family History  Problem Relation Age of Onset  . High Cholesterol Father   . High Cholesterol Mother   . Heart disease Father   . COPD Maternal Grandfather   . Asthma Mother    History  Substance Use Topics  . Smoking status: Never Smoker   . Smokeless tobacco: Never Used  . Alcohol Use: No   OB History   Grav Para Term Preterm Abortions TAB SAB Ect Mult Living                 Review of Systems  Constitutional: Positive for fatigue.  Musculoskeletal: Positive for gait problem.  Neurological: Positive for weakness.  All other systems reviewed and are negative.    Allergies  Sulfa antibiotics; Penicillins; and Other  Home Medications   Current Outpatient Rx  Name  Route  Sig  Dispense  Refill  . gabapentin (NEURONTIN) 300 MG capsule   Oral   Take 600 mg by mouth at bedtime.          Marland Kitchen MELATONIN ER PO   Oral   Take 10 mg by mouth at bedtime.          Marland Kitchen omeprazole (PRILOSEC) 20 MG capsule   Oral   Take 20 mg by mouth at bedtime.         . rizatriptan (MAXALT-MLT) 10 MG disintegrating tablet   Oral   Take 1 tablet (10 mg total) by mouth as needed for migraine. May repeat in 2 hours if  needed   15 tablet   6   . topiramate (TOPAMAX) 50 MG tablet   Oral   Take 2 tablets (100 mg total) by mouth 2 (two) times daily. One po bid xone week, then 2 tabs po bid   120 tablet   12    Triage Vitals: BP 129/86  Pulse 100  Temp(Src) 98.2 F (36.8 C) (Oral)  Resp 18  Ht 5' 5.5" (1.664 m)  Wt 200 lb (90.719 kg)  BMI 32.76 kg/m2  SpO2 100%  LMP 09/20/2013  Physical Exam  Nursing note and vitals reviewed. Constitutional: She is oriented to person, place, and time. She appears well-developed and well-nourished.  HENT:  Head: Normocephalic and atraumatic.  Eyes: Conjunctivae and EOM are normal. Pupils are equal, round, and reactive to light.  Neck: Normal range of motion and phonation normal. Neck supple.  Cardiovascular: Normal rate, regular rhythm and  intact distal pulses.   Pulmonary/Chest: Effort normal and breath sounds normal. She exhibits no tenderness.  Abdominal: Soft. She exhibits no distension. There is no tenderness. There is no guarding.  Musculoskeletal: Normal range of motion.  Neurological: She is alert and oriented to person, place, and time. She exhibits normal muscle tone.  Negative Romberg. No pronator drift.   Skin: Skin is warm and dry.  Psychiatric: Judgment and thought content normal. Her mood appears anxious. She is slowed.  Patient appears somewhat anxious and distractible.     ED Course  Procedures (including critical care time)  Patient Vitals for the past 24 hrs:  BP Temp Temp src Pulse Resp SpO2 Height Weight  10/11/13 2330 113/71 mmHg - - 80 - 99 % - -  10/11/13 2257 113/71 mmHg 98.7 F (37.1 C) Oral 88 17 99 % - -  10/11/13 2104 121/74 mmHg 98.7 F (37.1 C) Oral 81 16 100 % - -  10/11/13 1749 129/86 mmHg - - 100 - - - -  10/11/13 1743 129/91 mmHg 98.2 F (36.8 C) Oral 87 18 100 % 5' 5.5" (1.664 m) 200 lb (90.719 kg)   Medications  0.9 %  sodium chloride infusion ( Intravenous Duplicate 1/61/09 6045)  sodium chloride 0.9 % bolus 2,000 mL (0 mLs Intravenous Stopped 10/11/13 2343)    DIAGNOSTIC STUDIES: Oxygen Saturation is 100% on room air, normal by my interpretation.    COORDINATION OF CARE: 5:45 PM- Ordered EKG, CBC with differential, BMP, CBG, and troponin.   8:41 PM- Ordered a head CT. Ordered IV fluids to manage symptoms. Discussed treatment plan with patient at bedside and patient verbalized agreement.      Labs Review Labs Reviewed  CBC WITH DIFFERENTIAL  BASIC METABOLIC PANEL  TROPONIN I  CBG MONITORING, ED  CBG MONITORING, ED   Imaging Review Ct Head Wo Contrast  10/11/2013   CLINICAL DATA:  Three day history of weakness  EXAM: CT HEAD WITHOUT CONTRAST  TECHNIQUE: Contiguous axial images were obtained from the base of the skull through the vertex without intravenous contrast.   COMPARISON:  Prior brain MRI 09/19/2008  FINDINGS: Negative for acute intracranial hemorrhage, acute infarction, mass, mass effect, hydrocephalus or midline shift. Gray-white differentiation is preserved throughout. No acute soft tissue or calvarial abnormality. The globes and orbits are symmetric and unremarkable. Normal aeration of the mastoid air cells and visualized paranasal sinuses.  IMPRESSION: Negative head CT.   Electronically Signed   By: Jacqulynn Cadet M.D.   On: 10/11/2013 21:58     EKG Interpretation   Date/Time:  Monday October 11 2013 17:47:43 EDT Ventricular Rate:  89 PR Interval:  146 QRS Duration: 98 QT Interval:  380 QTC Calculation: 462 R Axis:   43 Text Interpretation:  Normal sinus rhythm Normal ECG No previous ECGs  available Confirmed by Azzam Mehra  MD, Vira Agar ([id number]) on 10/11/2013 8:24:44 PM      MDM   Final diagnoses:  Fatigue    Nonspecific recurrent fatigue with negative evaluation in the emergency department. Doubt serious bacterial infection, metabolic instability, CVA, multiple sclerosis, acute spinal myelopathy or impending vascular collapse.  Nursing Notes Reviewed/ Care Coordinated Applicable Imaging Reviewed Interpretation of Laboratory Data incorporated into ED treatment  The patient appears reasonably screened and/or stabilized for discharge and I doubt any other medical condition or other [facility] requiring further screening, evaluation, or treatment in the ED at this time prior to discharge.  Plan: Home Medications- usual; Home Treatments- rest; return here if the recommended treatment, does not improve the symptoms; Recommended follow up- PCP prn   I personally performed the services described in this documentation, which was scribed in my presence. The recorded information has been reviewed and is accurate.      Richarda Blade, MD 10/12/13 [phone]

## 2013-10-11 NOTE — ED Notes (Signed)
Pt c/o generalized weakness, reports weakness worse in both legs.  Pt says head feels like "mush."  Reports has been having these symptoms for the past month.  Pt says has history of seizures but doesn't think she has seized.  However, pt says feels like she does after having a seizure.

## 2013-10-19 ENCOUNTER — Telehealth: Payer: Self-pay | Admitting: Neurology

## 2013-10-19 DIAGNOSIS — R569 Unspecified convulsions: Secondary | ICD-10-CM

## 2013-10-19 DIAGNOSIS — M797 Fibromyalgia: Secondary | ICD-10-CM

## 2013-10-19 DIAGNOSIS — G43909 Migraine, unspecified, not intractable, without status migrainosus: Secondary | ICD-10-CM

## 2013-10-19 DIAGNOSIS — R51 Headache: Secondary | ICD-10-CM

## 2013-10-19 NOTE — Telephone Encounter (Signed)
Received information to schedule follow-up with Dr. Krista Blue for the patient.  When calling to schedule, patient states that she is having more problems with not being able to walk than her migraines.  She states she is having to walk with a cane but she is moving very slowly with it and if she has to stop for any reason she falls.  She has had problems with her left side jerking and issues with seizures. She went to the hospital for these symptoms on 3-23 and they kept her in the hospital until 3-27.  She went back to the hospital on 3-30 and they sent her back to her PCP.  She states the inability to walk on her own started about 3 weeks ago.  She would like to see any doctor or nurse practitioner before the first available of May 1st with Charlott Holler.  Please call to discuss if it would be possible to get her in earlier.  Thank you.

## 2013-10-28 ENCOUNTER — Encounter: Payer: Self-pay | Admitting: Neurology

## 2013-10-28 ENCOUNTER — Ambulatory Visit (INDEPENDENT_AMBULATORY_CARE_PROVIDER_SITE_OTHER): Payer: BC Managed Care – PPO | Admitting: Neurology

## 2013-10-28 VITALS — BP 106/69 | HR 102 | Ht 65.0 in | Wt 202.0 lb

## 2013-10-28 DIAGNOSIS — M797 Fibromyalgia: Secondary | ICD-10-CM

## 2013-10-28 DIAGNOSIS — G459 Transient cerebral ischemic attack, unspecified: Secondary | ICD-10-CM

## 2013-10-28 DIAGNOSIS — R569 Unspecified convulsions: Secondary | ICD-10-CM

## 2013-10-28 DIAGNOSIS — IMO0001 Reserved for inherently not codable concepts without codable children: Secondary | ICD-10-CM

## 2013-10-28 DIAGNOSIS — K219 Gastro-esophageal reflux disease without esophagitis: Secondary | ICD-10-CM

## 2013-10-28 DIAGNOSIS — G43909 Migraine, unspecified, not intractable, without status migrainosus: Secondary | ICD-10-CM

## 2013-10-28 DIAGNOSIS — R51 Headache: Secondary | ICD-10-CM

## 2013-10-28 NOTE — Progress Notes (Signed)
43 yo right-handed Caucasian female, accompanied by her husband, referred by her primary care physician Dr. Bryan Lemma for evaluation of constellation of complaints.  In 2010, she had episode of confusion, got lost in her familiar route, run out of gas, she was also found to have slurred speech, lasting for 3-4 hours mild unsteady gait was diagnosed with TIA.   She also had episode of left-sided weakness, numbness, mild gait difficulty dizziness in 2010, has to use cane for few days.  In March 2014, she had sudden onset left-sided numbness, involving her face, body, arm, leg, also has mild left side weakness, gait difficulty lasting for more than 24 hours  She had history of migraine since 2005, see flashing light in the right visual field sometimes, followed by severe pounding headaches with associated light noise sensitivity, nauseous.   She compalins of  extreme faigue,   urinary incontinence for one year, she wet her patns few times,she has sharp pain at the ball of her feet.  She has shoulder blader stabbing pain.   UPDATE June 18th 2014:  She continues to have spells, most of the time, it is associated with headaches, left side numbness, occasionally weakness, each episodes lasting for 1-2 days,  She has headaches at least once every 2 weeks, she sees sparks in her visual field, flashing light in her right visual field, lasting one hour, followed by severe pounding headaches, mostly at right side parietal, occipital region, she drags her left side, with left side numbness,   Trigger for her headaches are menstruation, stress.  She was given imitrex prn for headaches, it took away headaches, but it makes her sick, nauseous,   MRI of the brain was normal, laboratory showed a normal CMP, TSH, CBC, vitamin B12, ANA, negative RPR  UPDATE April 16th 2015:  She came in with her husband, she has difficulty walking, using cane, could not walk and talk on the phone the same time. But when she  talked about her symptoms, there was no significant dysarthria, she using her left arm for gesture, there was no significant weakness noticed on examination either, husband reported that she is moving slower,  She reported a history of "extensive neurological condition, including seizure in the past "  Review of Systems  Out of a complete 14 system review, the patient complains of only the following symptoms, and all other reviewed systems are negative.  Weight loss, fatigue, blurred vision, chest pain, palpitation, incontinence, achy muscles, memory loss, confusion, headache, numbness, weakness, slurred speech, difficulty swallowing, seizure, tremor,sleepiness  PHYSICAL EXAMINATOINS:  Generalized: In no acute distress  Neck: Supple, no carotid bruits   Cardiac: Regular rate rhythm  Pulmonary: Clear to auscultation bilaterally  Musculoskeletal: No deformity  Neurological examination  Mentation: Alert oriented to time, place, history taking, and causual conversation  Cranial nerve II-XII: Pupils were equal round reactive to light extraocular movements were full, visual field were full on confrontational test. facial sensation and strength were normal. hearing was intact to finger rubbing bilaterally. Uvula tongue midline.  head turning and shoulder shrug and were normal and symmetric.Tongue protrusion into cheek strength was normal.  Motor: Variable effort on motor examination, there was no significant weakness with distraction  Sensory: Intact to fine touch, pinprick, preserved vibratory sensation, and proprioception at toes.  Coordination: Normal finger to nose, heel-to-shin bilaterally there was no truncal ataxia  Gait: Cautious, deliberate gait,  Romberg signs: Negative  Deep tendon reflexes: Brachioradialis 2/2, biceps 2/2, triceps 2/2, patellar 2/2,  Achilles 2/2, plantar responses were flexor bilaterally.  A/P: 43 yo RH WF with constellation of neurological symptoms,  previous normal MRI of the brain, variable effort on examination Differentiation diagnosis including complicated migraines,   Proceed with MRI of the brain, EEG, physical therapy, Return to clinic with Hoyle Sauer in 6 months

## 2013-10-29 NOTE — Telephone Encounter (Signed)
Chart reviewed, proceed with MRI brain and eeg as scheduled, will call her report.   I will refer her to tertiary [facility], at her preference.  I will discharge her from our clinic after make sure she had appointment with academic neurology, and after I relayed  MRI and EEG result.

## 2013-10-29 NOTE — Telephone Encounter (Signed)
Message copied by Marcial Pacas on Fri Oct 29, 2013 12:27 PM ------      Message from: Jorja Loa      Created: Fri Oct 29, 2013 11:49 AM      Regarding: Please call your patient Erin Murray      Contact: [phone]       Hello Dr. Krista Blue,      I received a call today from your patient Erin Murray. She was very upset regarding her visit with you on yesterday. The patient feels that you were disregarding her every try to tell you that her health was rapidly declining. She said she could barely walk or grip anything in her hands. She states she came to see you using a cane, but you said that she looks and walks fine.             The patient states that you diagnosed her with Migraines ,but she knows that  migraines would not be effecting her legs and grip. She is also upset because her appt time was for 2:00, and she was brought back @ 3:07. Her last complaint was that you would not look at her list of declining health issues, stating that you would check it later.            She is upset stating she left here feeling as confused as when she came and learned nothing new. She has three children and is moving like a snail with a cane now. She felt you did not take her seriously about her trouble with walking.. Please call the patient, she is going to see her PCP today.            Thank you,      Tomeko ------

## 2013-11-01 LAB — SEDIMENTATION RATE: Sed Rate: 14 mm/hr (ref 0–32)

## 2013-11-01 LAB — ANA W/REFLEX IF POSITIVE: ANA: NEGATIVE

## 2013-11-01 LAB — METHYLMALONIC ACID, SERUM: METHYLMALONIC ACID: 147 nmol/L (ref 0–378)

## 2013-11-01 LAB — VITAMIN B12: Vitamin B-12: 273 pg/mL (ref 211–946)

## 2013-11-01 LAB — COPPER, SERUM: Copper: 139 ug/dL (ref 72–166)

## 2013-11-01 LAB — HOMOCYSTEINE: HOMOCYSTEINE: 7.9 umol/L (ref 0.0–15.0)

## 2013-11-02 ENCOUNTER — Telehealth: Payer: Self-pay | Admitting: Neurology

## 2013-11-02 ENCOUNTER — Other Ambulatory Visit: Payer: BC Managed Care – PPO | Admitting: Radiology

## 2013-11-02 NOTE — Telephone Encounter (Signed)
Erin Murray: Please call patient, she has low normal B12,   She should take over-the-counter B12 tablets supplements, 1044mcg daily, also taking folic acid 1 mg daily

## 2013-11-02 NOTE — Telephone Encounter (Signed)
Spoke to patient and relayed lab results, low normal B12, per Dr. Krista Blue.  Relayed doctor recommends taking B12 tablet supplements, [id number] daily, and folic acid 1mg  daily.

## 2014-02-04 ENCOUNTER — Telehealth: Payer: Self-pay | Admitting: *Deleted

## 2014-02-04 NOTE — Telephone Encounter (Signed)
Called patient to r/s appointment, per emr patient was to have EEG & MRI of the brain, patient has yet to do this testing, was not able to leave message due to both numbers no voice mail.

## 2014-02-07 ENCOUNTER — Ambulatory Visit: Payer: BC Managed Care – PPO | Admitting: Nurse Practitioner

## 2014-02-08 ENCOUNTER — Encounter: Payer: Self-pay | Admitting: Neurology

## 2014-05-16 ENCOUNTER — Other Ambulatory Visit: Payer: Self-pay | Admitting: Neurology

## 2014-07-09 ENCOUNTER — Emergency Department (HOSPITAL_COMMUNITY): Payer: No Typology Code available for payment source

## 2014-07-09 ENCOUNTER — Encounter (HOSPITAL_COMMUNITY): Payer: Self-pay | Admitting: Emergency Medicine

## 2014-07-09 ENCOUNTER — Emergency Department (HOSPITAL_COMMUNITY)
Admission: EM | Admit: 2014-07-09 | Discharge: 2014-07-10 | Disposition: A | Discharge status: Home / Self Care | Payer: No Typology Code available for payment source | Attending: Emergency Medicine | Admitting: Emergency Medicine

## 2014-07-09 DIAGNOSIS — Z880 Allergy status to penicillin: Secondary | ICD-10-CM | POA: Diagnosis not present

## 2014-07-09 DIAGNOSIS — E876 Hypokalemia: Secondary | ICD-10-CM | POA: Diagnosis not present

## 2014-07-09 DIAGNOSIS — R05 Cough: Secondary | ICD-10-CM | POA: Diagnosis present

## 2014-07-09 DIAGNOSIS — G40909 Epilepsy, unspecified, not intractable, without status epilepticus: Secondary | ICD-10-CM | POA: Insufficient documentation

## 2014-07-09 DIAGNOSIS — G43109 Migraine with aura, not intractable, without status migrainosus: Secondary | ICD-10-CM | POA: Diagnosis not present

## 2014-07-09 DIAGNOSIS — Z8673 Personal history of transient ischemic attack (TIA), and cerebral infarction without residual deficits: Secondary | ICD-10-CM | POA: Diagnosis not present

## 2014-07-09 DIAGNOSIS — R0989 Other specified symptoms and signs involving the circulatory and respiratory systems: Secondary | ICD-10-CM | POA: Diagnosis not present

## 2014-07-09 DIAGNOSIS — Z79899 Other long term (current) drug therapy: Secondary | ICD-10-CM | POA: Insufficient documentation

## 2014-07-09 DIAGNOSIS — R059 Cough, unspecified: Secondary | ICD-10-CM

## 2014-07-09 DIAGNOSIS — K219 Gastro-esophageal reflux disease without esophagitis: Secondary | ICD-10-CM | POA: Insufficient documentation

## 2014-07-09 NOTE — ED Notes (Signed)
Patient reports cough, congestion, fever. States had diarrhea last week, not having diarrhea anymore. Denies vomiting.

## 2014-07-09 NOTE — ED Provider Notes (Signed)
CSN: [id number]     Arrival date & time 07/09/14  2034 History  This chart was scribed for Erin Essex, MD by Peyton Bottoms, ED Scribe. This patient was seen in room APA16A/APA16A and the patient's care was started at 10:11 PM.   Chief Complaint  Patient presents with  . Cough   Patient is a 43 y.o. female presenting with cough. The history is provided by the patient. No language interpreter was used.  Cough  HPI Comments: Erin Murray is a 43 y.o. female with a history of fibromyalgia, headache, TIA, and seizures who presents to the Emergency Department complaining of moderate cough, congestion and fever that began 2 days ago. Patient also reports associated diarrhea that began last week. She took immodium as prescribed by Dr. Wenda Overland for diarrhea and states that it has since resolved. She states she had her first normal bowel movement this morning since episode of diarrhea. Patient reports fever of 102 degrees F measured 2 days ago. She reports associated congestion, productive cough, chills, and rhinorrhea that began 2 days ago. She denies associated chest pain, abdominal pain or back pain. She is a non smoker. She denies sick contact.  Past Medical History  Diagnosis Date  . Reflux   . Fibromyalgia   . Headache(784.0)   . TIA (transient ischemic attack) 2010  . Seizures   . Fibromyalgia   . Complicated migraine     includes: visual changes, left sided weakness, confusion, weakness, dizziness   Past Surgical History  Procedure Laterality Date  . Ovarian cyst drainage    . Tubal ligation     Family History  Problem Relation Age of Onset  . High Cholesterol Father   . High Cholesterol Mother   . Heart disease Father   . COPD Maternal Grandfather   . Asthma Mother    History  Substance Use Topics  . Smoking status: Never Smoker   . Smokeless tobacco: Never Used  . Alcohol Use: No   OB History    No data available     Review of Systems  Respiratory: Positive for  cough.    A complete 10 system review of systems was obtained and all systems are negative except as noted in the HPI and PMH.   Allergies  Sulfa antibiotics; Penicillins; and Other  Home Medications   Prior to Admission medications   Medication Sig Start Date End Date Taking? Authorizing Provider  cyanocobalamin (,VITAMIN B-12,) 1000 MCG/ML injection Inject 1,000 mcg into the muscle every 30 (thirty) days.  06/01/14  Yes Historical Provider, MD  diphenoxylate-atropine (LOMOTIL) 2.5-0.025 MG per tablet Take 1 tablet by mouth daily as needed for diarrhea or loose stools.  07/04/14  Yes Historical Provider, MD  gabapentin (NEURONTIN) 300 MG capsule Take 600 mg by mouth at bedtime.    Yes Historical Provider, MD  ibuprofen (ADVIL,MOTRIN) 200 MG tablet Take 800 mg by mouth every 6 (six) hours as needed for headache, mild pain or moderate pain.   Yes Historical Provider, MD  MELATONIN ER PO Take 10 mg by mouth at bedtime.    Yes Historical Provider, MD  omeprazole (PRILOSEC) 20 MG capsule Take 20 mg by mouth at bedtime.   Yes Historical Provider, MD  rizatriptan (MAXALT-MLT) 10 MG disintegrating tablet Take 1 tablet (10 mg total) by mouth as needed for migraine. May repeat in 2 hours if needed 12/30/12  Yes Marcial Pacas, MD  topiramate (TOPAMAX) 50 MG tablet Take 2 tablets (100 mg total) by  mouth 2 (two) times daily. One po bid xone week, then 2 tabs po bid Patient taking differently: Take 100 mg by mouth 2 (two) times daily.  12/30/12  Yes Marcial Pacas, MD  albuterol (PROVENTIL HFA;VENTOLIN HFA) 108 (90 BASE) MCG/ACT inhaler Inhale 2 puffs into the lungs every 6 (six) hours as needed for wheezing or shortness of breath. 07/10/14   Erin Essex, MD  benzonatate (TESSALON) 100 MG capsule Take 1 capsule (100 mg total) by mouth every 8 (eight) hours. 07/10/14   Erin Essex, MD  potassium chloride (K-DUR) 10 MEQ tablet Take 1 tablet (10 mEq total) by mouth 2 (two) times daily. 07/10/14   Erin Essex,  MD   Triage Vitals: BP 123/58 mmHg  Pulse 88  Temp(Src) 98.7 F (37.1 C)  Resp 20  Ht 5\' 6"  (1.676 m)  Wt 208 lb (94.348 kg)  BMI 33.59 kg/m2  SpO2 100%  LMP 06/16/2014  Physical Exam  Constitutional: She is oriented to person, place, and time. She appears well-developed and well-nourished. No distress.  HENT:  Head: Normocephalic and atraumatic.  Mouth/Throat: Oropharynx is clear and moist. No oropharyngeal exudate.  Congested. Dry cough.  Eyes: Conjunctivae and EOM are normal. Pupils are equal, round, and reactive to light.  Neck: Normal range of motion. Neck supple.  No meningismus.  Cardiovascular: Normal rate, regular rhythm, normal heart sounds and intact distal pulses.   No murmur heard. Pulmonary/Chest: Effort normal and breath sounds normal. No respiratory distress. She has no wheezes.  Lungs are clear.  Abdominal: Soft. There is no tenderness. There is no rebound and no guarding.  Musculoskeletal: Normal range of motion. She exhibits no edema or tenderness.  Neurological: She is alert and oriented to person, place, and time. No cranial nerve deficit. She exhibits normal muscle tone. Coordination normal.  No ataxia on finger to nose bilaterally. No pronator drift. 5/5 strength throughout. CN 2-12 intact. Negative Romberg. Equal grip strength. Sensation intact. Gait is normal.   Skin: Skin is warm.  Psychiatric: She has a normal mood and affect. Her behavior is normal.  Nursing note and vitals reviewed.  ED Course  Procedures (including critical care time)  DIAGNOSTIC STUDIES: Oxygen Saturation is 100% on RA, normal by my interpretation.    COORDINATION OF CARE: 10:21 PM- Discussed plans to order diagnostic CXR. Pt advised of plan for treatment and pt agrees.  Labs Review Labs Reviewed  CBC WITH DIFFERENTIAL - Abnormal; Notable for the following:    Hemoglobin 11.8 (*)    MCH 25.2 (*)    Platelets 406 (*)    All other components within normal limits   COMPREHENSIVE METABOLIC PANEL - Abnormal; Notable for the following:    Potassium 3.1 (*)    Total Bilirubin 0.1 (*)    All other components within normal limits  URINALYSIS, ROUTINE W REFLEX MICROSCOPIC - Abnormal; Notable for the following:    Leukocytes, UA TRACE (*)    All other components within normal limits  URINE MICROSCOPIC-ADD ON - Abnormal; Notable for the following:    Squamous Epithelial / LPF MANY (*)    Bacteria, UA MANY (*)    All other components within normal limits    Imaging Review Dg Chest 2 View  07/09/2014   CLINICAL DATA:  Fever, cough, congestion, body aches for 1 week.  EXAM: CHEST  2 VIEW  COMPARISON:  CT of the chest October 05, 2013  FINDINGS: Cardiomediastinal silhouette is unremarkable. The lungs are clear without pleural effusions or  focal consolidations. Trachea projects midline and there is no pneumothorax. Soft tissue planes and included osseous structures are non-suspicious.  IMPRESSION: Normal chest.   Electronically Signed   By: Elon Alas   On: 07/09/2014 22:18     EKG Interpretation   Date/Time:  Sunday July 10 2014 00:54:42 EST Ventricular Rate:  77 PR Interval:  146 QRS Duration: 99 QT Interval:  391 QTC Calculation: 442 R Axis:   37 Text Interpretation:  Sinus rhythm Baseline wander in lead(s) V5 No  significant change was found Confirmed by Wyvonnia Dusky  MD, Jaye Polidori ([id number]) on  07/10/2014 12:58:40 AM     MDM   Final diagnoses:  Hypokalemia   2 days of cough and congestion.  Had diarrhea last week which improved with imodium.  Normal BM today. No chest pain or abdominal pain.  CXR negative. UA contaminated.  Potassium replaced. No CP or SOB.  No hypoxia.  Suspect viral syndrome/ bronchitis.  Supportive care d/w Patient. No role for antibiotics. Follow up with PCP. Return precautions discussed.  I personally performed the services described in this documentation, which was scribed in my presence. The recorded information  has been reviewed and is accurate.  Erin Essex, MD 07/10/14 [phone]

## 2014-07-10 LAB — COMPREHENSIVE METABOLIC PANEL
ALBUMIN: 3.9 g/dL (ref 3.5–5.2)
ALK PHOS: 73 U/L (ref 39–117)
ALT: 18 U/L (ref 0–35)
AST: 18 U/L (ref 0–37)
Anion gap: 8 (ref 5–15)
BUN: 13 mg/dL (ref 6–23)
CHLORIDE: 109 meq/L (ref 96–112)
CO2: 22 mmol/L (ref 19–32)
Calcium: 8.6 mg/dL (ref 8.4–10.5)
Creatinine, Ser: 0.78 mg/dL (ref 0.50–1.10)
GFR calc Af Amer: >90 mL/min (ref >90)
GLUCOSE: 96 mg/dL (ref 70–99)
POTASSIUM: 3.1 mmol/L — AB (ref 3.5–5.1)
Sodium: 139 mmol/L (ref 135–145)
Total Bilirubin: 0.1 mg/dL — ABNORMAL LOW (ref 0.3–1.2)
Total Protein: 8 g/dL (ref 6.0–8.3)

## 2014-07-10 LAB — CBC WITH DIFFERENTIAL/PLATELET
BLASTS: 0 %
Band Neutrophils: 0 % (ref 0–10)
Basophils Absolute: 0 10*3/uL (ref 0.0–0.1)
Basophils Relative: 0 % (ref 0–1)
EOS ABS: 0.3 10*3/uL (ref 0.0–0.7)
EOS PCT: 3 % (ref 0–5)
HCT: 37 % (ref 36.0–46.0)
Hemoglobin: 11.8 g/dL — ABNORMAL LOW (ref 12.0–15.0)
Lymphocytes Relative: 30 % (ref 12–46)
Lymphs Abs: 2.6 10*3/uL (ref 0.7–4.0)
MCH: 25.2 pg — AB (ref 26.0–34.0)
MCHC: 31.9 g/dL (ref 30.0–36.0)
MCV: 79.1 fL (ref 78.0–100.0)
MYELOCYTES: 0 %
Metamyelocytes Relative: 0 %
Monocytes Absolute: 0.7 10*3/uL (ref 0.1–1.0)
Monocytes Relative: 8 % (ref 3–12)
NEUTROS ABS: 5.1 10*3/uL (ref 1.7–7.7)
NEUTROS PCT: 59 % (ref 43–77)
NRBC: 0 /100{WBCs}
PLATELETS: 406 10*3/uL — AB (ref 150–400)
Promyelocytes Absolute: 0 %
RBC: 4.68 MIL/uL (ref 3.87–5.11)
RDW: 15 % (ref 11.5–15.5)
WBC: 8.7 10*3/uL (ref 4.0–10.5)

## 2014-07-10 LAB — URINE MICROSCOPIC-ADD ON

## 2014-07-10 LAB — URINALYSIS, ROUTINE W REFLEX MICROSCOPIC
BILIRUBIN URINE: NEGATIVE
Glucose, UA: NEGATIVE mg/dL
Hgb urine dipstick: NEGATIVE
Ketones, ur: NEGATIVE mg/dL
Nitrite: NEGATIVE
PH: 6 (ref 5.0–8.0)
PROTEIN: NEGATIVE mg/dL
Specific Gravity, Urine: 1.025 (ref 1.005–1.030)
Urobilinogen, UA: 0.2 mg/dL (ref 0.0–1.0)

## 2014-07-10 MED ORDER — ALBUTEROL SULFATE HFA 108 (90 BASE) MCG/ACT IN AERS: 2.0000 | INHALATION_SPRAY | Freq: Four times a day (QID) | RESPIRATORY_TRACT | Status: DC | PRN

## 2014-07-10 MED ORDER — BENZONATATE 100 MG PO CAPS: 100.0000 mg | ORAL_CAPSULE | Freq: Three times a day (TID) | ORAL | Status: DC

## 2014-07-10 MED ORDER — POTASSIUM CHLORIDE ER 10 MEQ PO TBCR: 10.0000 meq | EXTENDED_RELEASE_TABLET | Freq: Two times a day (BID) | ORAL | Status: DC

## 2014-07-10 MED ORDER — POTASSIUM CHLORIDE CRYS ER 20 MEQ PO TBCR
40.0000 meq | EXTENDED_RELEASE_TABLET | Freq: Once | ORAL | Status: AC
  Administered 2014-07-10: 40 meq via ORAL
  Filled 2014-07-10: qty 2

## 2014-07-10 NOTE — Discharge Instructions (Signed)
Cough, Adult  A cough is a reflex that helps clear your throat and airways. It can help heal the body or may be a reaction to an irritated airway. A cough may only last 2 or 3 weeks (acute) or may last more than 8 weeks (chronic).  CAUSES Acute cough:  Viral or bacterial infections. Chronic cough:  Infections.  Allergies.  Asthma.  Post-nasal drip.  Smoking.  Heartburn or acid reflux.  Some medicines.  Chronic lung problems (COPD).  Cancer. SYMPTOMS   Cough.  Fever.  Chest pain.  Increased breathing rate.  High-pitched whistling sound when breathing (wheezing).  Colored mucus that you cough up (sputum). TREATMENT   A bacterial cough may be treated with antibiotic medicine.  A viral cough must run its course and will not respond to antibiotics.  Your caregiver may recommend other treatments if you have a chronic cough. HOME CARE INSTRUCTIONS   Only take over-the-counter or prescription medicines for pain, discomfort, or fever as directed by your caregiver. Use cough suppressants only as directed by your caregiver.  Use a cold steam vaporizer or humidifier in your bedroom or home to help loosen secretions.  Sleep in a semi-upright position if your cough is worse at night.  Rest as needed.  Stop smoking if you smoke. SEEK IMMEDIATE MEDICAL CARE IF:   You have pus in your sputum.  Your cough starts to worsen.  You cannot control your cough with suppressants and are losing sleep.  You begin coughing up blood.  You have difficulty breathing.  You develop pain which is getting worse or is uncontrolled with medicine.  You have a fever. MAKE SURE YOU:   Understand these instructions.  Will watch your condition.  Will get help right away if you are not doing well or get worse. Document Released: 12/28/2010 Document Revised: 09/23/2011 Document Reviewed: 12/28/2010 [employer] Patient Information 2015 [employer]. This information is not intended  to replace advice given to you by your health care provider. Make sure you discuss any questions you have with your health care provider.  

## 2014-09-16 ENCOUNTER — Emergency Department (HOSPITAL_COMMUNITY)
Admission: EM | Admit: 2014-09-16 | Discharge: 2014-09-17 | Disposition: A | Discharge status: Home / Self Care | Payer: Managed Care, Other (non HMO) | Attending: Emergency Medicine | Admitting: Emergency Medicine

## 2014-09-16 ENCOUNTER — Encounter (HOSPITAL_COMMUNITY): Payer: Self-pay | Admitting: Emergency Medicine

## 2014-09-16 DIAGNOSIS — R0602 Shortness of breath: Secondary | ICD-10-CM | POA: Diagnosis not present

## 2014-09-16 DIAGNOSIS — Z9851 Tubal ligation status: Secondary | ICD-10-CM | POA: Diagnosis not present

## 2014-09-16 DIAGNOSIS — G40909 Epilepsy, unspecified, not intractable, without status epilepticus: Secondary | ICD-10-CM | POA: Insufficient documentation

## 2014-09-16 DIAGNOSIS — Z880 Allergy status to penicillin: Secondary | ICD-10-CM | POA: Insufficient documentation

## 2014-09-16 DIAGNOSIS — R1010 Upper abdominal pain, unspecified: Secondary | ICD-10-CM | POA: Diagnosis not present

## 2014-09-16 DIAGNOSIS — G43909 Migraine, unspecified, not intractable, without status migrainosus: Secondary | ICD-10-CM | POA: Insufficient documentation

## 2014-09-16 DIAGNOSIS — M797 Fibromyalgia: Secondary | ICD-10-CM | POA: Diagnosis not present

## 2014-09-16 DIAGNOSIS — R110 Nausea: Secondary | ICD-10-CM | POA: Insufficient documentation

## 2014-09-16 DIAGNOSIS — Z8673 Personal history of transient ischemic attack (TIA), and cerebral infarction without residual deficits: Secondary | ICD-10-CM | POA: Insufficient documentation

## 2014-09-16 DIAGNOSIS — R197 Diarrhea, unspecified: Secondary | ICD-10-CM | POA: Diagnosis not present

## 2014-09-16 DIAGNOSIS — R109 Unspecified abdominal pain: Secondary | ICD-10-CM | POA: Diagnosis present

## 2014-09-16 DIAGNOSIS — Z79899 Other long term (current) drug therapy: Secondary | ICD-10-CM | POA: Diagnosis not present

## 2014-09-16 DIAGNOSIS — K219 Gastro-esophageal reflux disease without esophagitis: Secondary | ICD-10-CM | POA: Diagnosis not present

## 2014-09-16 LAB — CBC WITH DIFFERENTIAL/PLATELET
BASOS ABS: 0 10*3/uL (ref 0.0–0.1)
Basophils Relative: 0 % (ref 0–1)
Eosinophils Absolute: 0.1 10*3/uL (ref 0.0–0.7)
Eosinophils Relative: 1 % (ref 0–5)
HCT: 36.3 % (ref 36.0–46.0)
Hemoglobin: 11.5 g/dL — ABNORMAL LOW (ref 12.0–15.0)
LYMPHS ABS: 2.6 10*3/uL (ref 0.7–4.0)
Lymphocytes Relative: 24 % (ref 12–46)
MCH: 25.7 pg — AB (ref 26.0–34.0)
MCHC: 31.7 g/dL (ref 30.0–36.0)
MCV: 81.2 fL (ref 78.0–100.0)
Monocytes Absolute: 0.7 10*3/uL (ref 0.1–1.0)
Monocytes Relative: 7 % (ref 3–12)
NEUTROS ABS: 7.5 10*3/uL (ref 1.7–7.7)
NEUTROS PCT: 68 % (ref 43–77)
Platelets: 356 10*3/uL (ref 150–400)
RBC: 4.47 MIL/uL (ref 3.87–5.11)
RDW: 14.6 % (ref 11.5–15.5)
WBC: 10.9 10*3/uL — ABNORMAL HIGH (ref 4.0–10.5)

## 2014-09-16 LAB — URINALYSIS, ROUTINE W REFLEX MICROSCOPIC
BILIRUBIN URINE: NEGATIVE
Glucose, UA: NEGATIVE mg/dL
Hgb urine dipstick: NEGATIVE
Ketones, ur: NEGATIVE mg/dL
NITRITE: NEGATIVE
PH: 5.5 (ref 5.0–8.0)
Protein, ur: NEGATIVE mg/dL
UROBILINOGEN UA: 0.2 mg/dL (ref 0.0–1.0)

## 2014-09-16 LAB — BASIC METABOLIC PANEL
Anion gap: 6 (ref 5–15)
BUN: 15 mg/dL (ref 6–23)
CALCIUM: 8.9 mg/dL (ref 8.4–10.5)
CO2: 23 mmol/L (ref 19–32)
Chloride: 112 mmol/L (ref 96–112)
Creatinine, Ser: 0.85 mg/dL (ref 0.50–1.10)
GFR calc Af Amer: >90 mL/min (ref >90)
GFR calc non Af Amer: 83 mL/min — ABNORMAL LOW (ref >90)
GLUCOSE: 90 mg/dL (ref 70–99)
POTASSIUM: 3.7 mmol/L (ref 3.5–5.1)
SODIUM: 141 mmol/L (ref 135–145)

## 2014-09-16 LAB — URINE MICROSCOPIC-ADD ON

## 2014-09-16 LAB — LIPASE, BLOOD: Lipase: 24 U/L (ref 11–59)

## 2014-09-16 NOTE — ED Provider Notes (Signed)
CSN: [id number]     Arrival date & time 09/16/14  1848 History  This chart was scribed for Carmin Muskrat, MD by Edison Simon, ED Scribe. This patient was seen in room [facility] and the patient's care was started at 11:43 PM.    Chief Complaint  Patient presents with  . Abdominal Pain   The history is provided by the patient. No language interpreter was used.    HPI Comments: Erin Murray is a 44 y.o. female who presents to the Emergency Department complaining of pain to upper abdomen w(orse on right side), right upper back, and right shoulder blade, worse last night but intermittent for the past 1-2 months. She states it is worse after eating. She rates it at 8/10 now. She reports associated nausea, diarrhea, and SOB. She states she had a few counts of diarrhea last night and a few this morning. She states she has not seen anybody for her symptoms. She states she has tried Ibuprofen, Tylenol, and other OTC medications without improvement in pain. She notes a recent death of a sister-in-law. She reports history of migraines and fibromyalgia, but nothing related to current symptoms. She reports tubal ligation 9 years ago and ovarian cyst drainage 15 years ago. She does not drink or smoke. She denies fever, confusion, syncope, chest pain, or focal weaknesses.  Past Medical History  Diagnosis Date  . Reflux   . Fibromyalgia   . Headache(784.0)   . TIA (transient ischemic attack) 2010  . Seizures   . Fibromyalgia   . Complicated migraine     includes: visual changes, left sided weakness, confusion, weakness, dizziness   Past Surgical History  Procedure Laterality Date  . Ovarian cyst drainage    . Tubal ligation     Family History  Problem Relation Age of Onset  . High Cholesterol Father   . High Cholesterol Mother   . Heart disease Father   . COPD Maternal Grandfather   . Asthma Mother    History  Substance Use Topics  . Smoking status: Never Smoker   . Smokeless tobacco: Never  Used  . Alcohol Use: No   OB History    No data available     Review of Systems  Constitutional: Negative for fever.       Per HPI, otherwise negative  HENT:       Per HPI, otherwise negative  Respiratory: Positive for shortness of breath.        Per HPI, otherwise negative  Cardiovascular: Negative for chest pain.       Per HPI, otherwise negative  Gastrointestinal: Positive for nausea, abdominal pain and diarrhea. Negative for vomiting.  Endocrine:       Negative aside from HPI  Genitourinary:       Neg aside from HPI   Musculoskeletal:       Per HPI, otherwise negative  Skin: Negative.   Neurological: Negative for syncope.  Psychiatric/Behavioral: Negative for confusion.      Allergies  Sulfa antibiotics; Penicillins; and Other  Home Medications   Prior to Admission medications   Medication Sig Start Date End Date Taking? Authorizing Provider  albuterol (PROVENTIL HFA;VENTOLIN HFA) 108 (90 BASE) MCG/ACT inhaler Inhale 2 puffs into the lungs every 6 (six) hours as needed for wheezing or shortness of breath. 07/10/14  Yes Ezequiel Essex, MD  cyanocobalamin (,VITAMIN B-12,) 1000 MCG/ML injection Inject 1,000 mcg into the muscle every 30 (thirty) days.  06/01/14  Yes Historical Provider, MD  gabapentin (  NEURONTIN) 300 MG capsule Take 600 mg by mouth at bedtime.    Yes Historical Provider, MD  ibuprofen (ADVIL,MOTRIN) 200 MG tablet Take 800 mg by mouth every 6 (six) hours as needed for headache, mild pain or moderate pain.   Yes Historical Provider, MD  MELATONIN ER PO Take 10 mg by mouth at bedtime.    Yes Historical Provider, MD  omeprazole (PRILOSEC) 20 MG capsule Take 20 mg by mouth at bedtime.   Yes Historical Provider, MD  topiramate (TOPAMAX) 50 MG tablet Take 2 tablets (100 mg total) by mouth 2 (two) times daily. One po bid xone week, then 2 tabs po bid Patient taking differently: Take 100 mg by mouth 2 (two) times daily.  12/30/12  Yes Marcial Pacas, MD  rizatriptan  (MAXALT-MLT) 10 MG disintegrating tablet Take 1 tablet (10 mg total) by mouth as needed for migraine. May repeat in 2 hours if needed 12/30/12   Marcial Pacas, MD   BP 120/84 mmHg  Pulse 81  Temp(Src) 98.7 F (37.1 C) (Oral)  Resp 18  Ht 5\' 5"  (1.651 m)  Wt 200 lb (90.719 kg)  BMI 33.28 kg/m2  SpO2 100%  LMP 09/02/2014 Physical Exam  Constitutional: She is oriented to person, place, and time. She appears well-developed and well-nourished. No distress.  HENT:  Head: Normocephalic and atraumatic.  Eyes: Conjunctivae and EOM are normal.  Cardiovascular: Normal rate, regular rhythm and normal heart sounds.   Equal bilateral upper pulses  Pulmonary/Chest: Effort normal and breath sounds normal. No stridor. No respiratory distress. She has no wheezes. She has no rales.  Abdominal: Soft. She exhibits no distension. There is no tenderness.  Musculoskeletal: She exhibits no edema.  Neurological: She is alert and oriented to person, place, and time. No cranial nerve deficit.  Skin: Skin is warm and dry.  Psychiatric: She has a normal mood and affect.  Nursing note and vitals reviewed.   ED Course  Procedures (including critical care time)  DIAGNOSTIC STUDIES: Oxygen Saturation is 100% on room air, normal by my interpretation.    COORDINATION OF CARE: 11:50 PM Discussed treatment plan with patient at beside, the patient agrees with the plan and has no further questions at this time.   Labs Review Labs Reviewed  CBC WITH DIFFERENTIAL/PLATELET - Abnormal; Notable for the following:    WBC 10.9 (*)    Hemoglobin 11.5 (*)    MCH 25.7 (*)    All other components within normal limits  BASIC METABOLIC PANEL - Abnormal; Notable for the following:    GFR calc non Af Amer 83 (*)    All other components within normal limits  URINALYSIS, ROUTINE W REFLEX MICROSCOPIC - Abnormal; Notable for the following:    Specific Gravity, Urine >1.030 (*)    Leukocytes, UA SMALL (*)    All other components  within normal limits  URINE MICROSCOPIC-ADD ON - Abnormal; Notable for the following:    Squamous Epithelial / LPF FEW (*)    Bacteria, UA MANY (*)    All other components within normal limits  LIPASE, BLOOD  HEPATIC FUNCTION PANEL    Imaging Review Ct Abdomen Pelvis Wo Contrast  09/17/2014   CLINICAL DATA:  Chronic bilateral upper quadrant abdominal pain for 2 months. Nausea after eating. Shortness of breath. Diarrhea. Initial encounter.  EXAM: CT ABDOMEN AND PELVIS WITHOUT CONTRAST  TECHNIQUE: Multidetector CT imaging of the abdomen and pelvis was performed following the standard protocol without IV contrast.  COMPARISON:  CT  of the abdomen and pelvis from 04/30/2008  FINDINGS: The visualized lung bases are clear.  The liver and spleen are unremarkable in appearance. The gallbladder is within normal limits. The pancreas and adrenal glands are unremarkable.  There is a slightly hypoattenuating vague 2.2 cm focus arising from the lateral aspect of the right kidney. This is not well assessed without contrast, and is new from 2009.  There is no evidence of hydronephrosis. No renal or ureteral stones are seen. No perinephric stranding is appreciated.  No free fluid is identified. The small bowel is unremarkable in appearance. The stomach is within normal limits. No acute vascular abnormalities are seen.  The appendix is normal in caliber and contains contrast, without evidence for appendicitis. Contrast progresses to the level of the sigmoid colon. The colon is unremarkable in appearance.  The bladder is moderately distended and grossly unremarkable. The uterus is unremarkable in appearance. The ovaries are relatively symmetric. No suspicious adnexal masses are seen. The patient is status post tubal ligation. No inguinal lymphadenopathy is seen.  No acute osseous abnormalities are identified.  IMPRESSION: 1. No acute abnormality seen to explain the patient's symptoms. 2. Slightly hypoattenuating 2.2 cm focus  arising from the lateral aspect of the right kidney. This is not well assessed without contrast, and is new from 2009. Renal ultrasound or contrast-enhanced CT is recommended for further evaluation, when and as deemed clinically appropriate.   Electronically Signed   By: Garald Balding M.D.   On: 09/17/2014 03:11   I reviewed the imaging myself. I agree with the interpretation.  3:32 AM On repeat exam the patient is in no distress, sleeping. I discussed all findings with the patient and her husband, including the new renal finding. Patient will follow-up for additional imaging with her primary care physician.      MDM   Patient presents with ongoing nausea, vomiting, abdominal pain. Here the patient is awake and alert, in no distress.  Patient has a soft, non-peritoneal abdomen. CT did not demonstrate acute findings, though there is new lesion on the right kidney, that the patient will follow up with her primary care physician for additional evaluation. Patient sleeping after initial interventions, was discharged in stable condition to follow-up with primary care and gastroenterology. With no chest pain, dyspnea, ACS seems unlikely. Soft, non-peritoneal abdomen, occult infection seems unlikely as well. Patient's symptoms are likely due to gastroesophageal disease.   I personally performed the services described in this documentation, which was scribed in my presence. The recorded information has been reviewed and is accurate.     Carmin Muskrat, MD 09/17/14 [id number]

## 2014-09-16 NOTE — ED Notes (Signed)
Visitor to nursing station asking for blanket, I told him I would have to verify that patient was not febrile. Visitor then became very upset and started yelling, asking for everyone's first and last name. Security called when they arrived visitor then started threatening staff if immediate assistance is not provided. Patient is now bing belligerent with charge nurse Ron and security.

## 2014-09-16 NOTE — ED Notes (Signed)
Patient complaining of upper abdominal pain radiating into her back x 2 months. Patient states it is worse after eating.

## 2014-09-17 ENCOUNTER — Emergency Department (HOSPITAL_COMMUNITY): Payer: Managed Care, Other (non HMO)

## 2014-09-17 LAB — HEPATIC FUNCTION PANEL
ALT: 13 U/L (ref 0–35)
AST: 14 U/L (ref 0–37)
Albumin: 4 g/dL (ref 3.5–5.2)
Alkaline Phosphatase: 81 U/L (ref 39–117)
Bilirubin, Direct: 0.1 mg/dL (ref 0.0–0.5)
Indirect Bilirubin: 0.4 mg/dL (ref 0.3–0.9)
Total Bilirubin: 0.5 mg/dL (ref 0.3–1.2)
Total Protein: 7.6 g/dL (ref 6.0–8.3)

## 2014-09-17 MED ORDER — SODIUM CHLORIDE 0.9 % IV BOLUS (SEPSIS)
1000.0000 mL | Freq: Once | INTRAVENOUS | Status: AC
  Administered 2014-09-17: 1000 mL via INTRAVENOUS

## 2014-09-17 MED ORDER — SUCRALFATE 1 GM/10ML PO SUSP: 1.0000 g | Freq: Three times a day (TID) | ORAL | Status: DC

## 2014-09-17 MED ORDER — OMEPRAZOLE 20 MG PO CPDR: 20.0000 mg | DELAYED_RELEASE_CAPSULE | Freq: Every day | ORAL | Status: DC

## 2014-09-17 MED ORDER — KETOROLAC TROMETHAMINE 30 MG/ML IJ SOLN
30.0000 mg | Freq: Once | INTRAMUSCULAR | Status: AC
  Administered 2014-09-17: 30 mg via INTRAVENOUS
  Filled 2014-09-17: qty 1

## 2014-09-17 NOTE — ED Notes (Signed)
MD at bedside. 

## 2014-09-17 NOTE — Discharge Instructions (Signed)
As discussed, your evaluation today has been largely reassuring.  But, it is important that you monitor your condition carefully, and do not hesitate to return to the ED if you develop new, or concerning changes in your condition.  Otherwise, please follow-up with your physicians for appropriate ongoing care.  With your primary care physician, please discussed the newly visualized lesion on your right kidney, to discuss additional evaluation options. With the gastroenterologist, please discuss her ongoing abdominal pain, lack of appetite.   Abdominal Pain Many things can cause abdominal pain. Usually, abdominal pain is not caused by a disease and will improve without treatment. It can often be observed and treated at home. Your health care provider will do a physical exam and possibly order blood tests and X-rays to help determine the seriousness of your pain. However, in many cases, more time must pass before a clear cause of the pain can be found. Before that point, your health care provider may not know if you need more testing or further treatment. HOME CARE INSTRUCTIONS  Monitor your abdominal pain for any changes. The following actions may help to alleviate any discomfort you are experiencing:  Only take over-the-counter or prescription medicines as directed by your health care provider.  Do not take laxatives unless directed to do so by your health care provider.  Try a clear liquid diet (broth, tea, or water) as directed by your health care provider. Slowly move to a bland diet as tolerated. SEEK MEDICAL CARE IF:  You have unexplained abdominal pain.  You have abdominal pain associated with nausea or diarrhea.  You have pain when you urinate or have a bowel movement.  You experience abdominal pain that wakes you in the night.  You have abdominal pain that is worsened or improved by eating food.  You have abdominal pain that is worsened with eating fatty foods.  You have a  fever. SEEK IMMEDIATE MEDICAL CARE IF:   Your pain does not go away within 2 hours.  You keep throwing up (vomiting).  Your pain is felt only in portions of the abdomen, such as the right side or the left lower portion of the abdomen.  You pass bloody or black tarry stools. MAKE SURE YOU:  Understand these instructions.   Will watch your condition.   Will get help right away if you are not doing well or get worse.  Document Released: 04/10/2005 Document Revised: 07/06/2013 Document Reviewed: 03/10/2013 [facility] Patient Information 2015 [place], [place]. This information is not intended to replace advice given to you by your health care provider. Make sure you discuss any questions you have with your health care provider.

## 2014-09-30 ENCOUNTER — Encounter (INDEPENDENT_AMBULATORY_CARE_PROVIDER_SITE_OTHER): Payer: Self-pay | Admitting: *Deleted

## 2014-10-04 ENCOUNTER — Ambulatory Visit (INDEPENDENT_AMBULATORY_CARE_PROVIDER_SITE_OTHER): Payer: Managed Care, Other (non HMO) | Admitting: Internal Medicine

## 2014-10-04 ENCOUNTER — Encounter (INDEPENDENT_AMBULATORY_CARE_PROVIDER_SITE_OTHER): Payer: Self-pay | Admitting: *Deleted

## 2014-10-04 ENCOUNTER — Encounter (INDEPENDENT_AMBULATORY_CARE_PROVIDER_SITE_OTHER): Payer: Self-pay | Admitting: Internal Medicine

## 2014-10-04 VITALS — BP 130/70 | HR 88 | Temp 98.0°F | Ht 65.5 in | Wt 213.5 lb

## 2014-10-04 DIAGNOSIS — K219 Gastro-esophageal reflux disease without esophagitis: Secondary | ICD-10-CM

## 2014-10-04 MED ORDER — SUCRALFATE 1 GM/10ML PO SUSP: 1.0000 g | Freq: Three times a day (TID) | ORAL | Status: DC

## 2014-10-04 MED ORDER — OMEPRAZOLE 20 MG PO CPDR: 20.0000 mg | DELAYED_RELEASE_CAPSULE | Freq: Two times a day (BID) | ORAL | Status: DC

## 2014-10-04 MED ORDER — SUCRALFATE 1 GM/10ML PO SUSP: 1.0000 g | Freq: Four times a day (QID) | ORAL | Status: DC

## 2014-10-04 NOTE — Patient Instructions (Addendum)
Prilosec 20mg  68minutes before breakfast, and 30 minutes before supper.  EGD. The risks and benefits such as perforation, bleeding, and infection were reviewed with the patient and is agreeable.

## 2014-10-04 NOTE — Progress Notes (Signed)
Subjective:    Patient ID: Erin Murray, female    DOB: 06-08-1971, 44 y.o.   MRN: [id number]  HPI Referred to our office by Dr. Wenda Overland for epigastric pain . She was evaluated in the ED 09/17/2014 at [facility]. She had eaten some [place] chicken wings.  Started on Carafate, which has helped some.  She tells me today she has pain with everything she eats. The pain spreads across her upper abdomen. The pain will last for a couple of hours and then resolve. The pain has been occurring for a couple of months. She thinks she has lost about 5-6 pounds.  Appetite is okay. She is afraid of the pain. She takes the Prilosec at night. She has been on Prilosec for years. She is avoiding spicy foods.  She has taken Protonix but cannot take it but of diarrhea. Today she has epigastric pain. She does have some nausea but no vomiting. There is no fever. She usually has a BM day and sometimes they are lose and sometimes they are not.   She had a colonoscopy 6 months ago by Dr. Britta Mccreedy for rectal bleeding. Colonoscopy was normal.  Family hx of colon cancer in a father in his 64s when he developed colon cancer and a sister in her 52s. She also has a sister with a hx of breast cancer.   CBC    Component Value Date/Time   WBC 10.9* 09/16/2014 1949   WBC 7.5 11/17/2012 0919   RBC 4.47 09/16/2014 1949   RBC 4.68 11/17/2012 0919   HGB 11.5* 09/16/2014 1949   HCT 36.3 09/16/2014 1949   PLT 356 09/16/2014 1949   MCV 81.2 09/16/2014 1949   MCH 25.7* 09/16/2014 1949   MCH 26.9 11/17/2012 0919   MCHC 31.7 09/16/2014 1949   MCHC 32.9 11/17/2012 0919   RDW 14.6 09/16/2014 1949   RDW 14.4 11/17/2012 0919   LYMPHSABS 2.6 09/16/2014 1949   MONOABS 0.7 09/16/2014 1949   EOSABS 0.1 09/16/2014 1949   BASOSABS 0.0 09/16/2014 1949    CMP     Component Value Date/Time   NA 141 09/16/2014 1949   NA 138 11/17/2012 0919   K 3.7 09/16/2014 1949   CL 112 09/16/2014 1949   CO2 23 09/16/2014 1949   GLUCOSE 90 09/16/2014  1949   GLUCOSE 89 11/17/2012 0919   BUN 15 09/16/2014 1949   BUN 15 11/17/2012 0919   CREATININE 0.85 09/16/2014 1949   CALCIUM 8.9 09/16/2014 1949   PROT 7.6 09/17/2014 0010   PROT 7.0 11/17/2012 0919   ALBUMIN 4.0 09/17/2014 0010   AST 14 09/17/2014 0010   ALT 13 09/17/2014 0010   ALKPHOS 81 09/17/2014 0010   BILITOT 0.5 09/17/2014 0010   GFRNONAA 83* 09/16/2014 1949   GFRAA >90 09/16/2014 1949   .cm   09/17/2014 CT abdomen/pelvis wo CM:      CLINICAL DATA: Chronic bilateral upper quadrant abdominal pain for 2 months. Nausea after eating. Shortness of breath. Diarrhea. Initial encounter.  IMPRESSION: 1. No acute abnormality seen to explain the patient's symptoms. 2. Slightly hypoattenuating 2.2 cm focus arising from the lateral aspect of the right kidney. This is not well assessed without contrast, and is new from 2009. Renal ultrasound or contrast-enhanced CT is recommended for further evaluation, when and as deemed clinically appropriate.  Review of Systems Married. Three children in good health. One of children has autism.    Objective:   Physical Exam Blood pressure 130/70, pulse 88,  temperature 98 F (36.7 C), height 5' 5.5" (1.664 m), weight 213 lb 8 oz (96.843 kg), last menstrual period 09/02/2014. Alert and oriented. Skin warm and dry. Oral mucosa is moist.   . Sclera anicteric, conjunctivae is pink. Thyroid not enlarged. No cervical lymphadenopathy. Lungs clear. Heart regular rate and rhythm.  Abdomen is soft. Bowel sounds are positive. No hepatomegaly. No abdominal masses felt. No tenderness.  No edema to lower extremities.          Assessment & Plan:  Epigastirc pain. PUD needs to be ruled out. Prilosec 20mg  BID, Continue the Carafate. EGD. The risks and benefits such as perforation, bleeding, and infection were reviewed with the patient and is agreeable.

## 2014-10-05 ENCOUNTER — Other Ambulatory Visit (INDEPENDENT_AMBULATORY_CARE_PROVIDER_SITE_OTHER): Payer: Self-pay | Admitting: *Deleted

## 2014-10-05 DIAGNOSIS — R1013 Epigastric pain: Principal | ICD-10-CM

## 2014-10-05 DIAGNOSIS — G8929 Other chronic pain: Secondary | ICD-10-CM

## 2014-10-06 ENCOUNTER — Encounter (INDEPENDENT_AMBULATORY_CARE_PROVIDER_SITE_OTHER): Payer: Self-pay | Admitting: Internal Medicine

## 2014-10-14 ENCOUNTER — Ambulatory Visit (HOSPITAL_COMMUNITY)
Admission: RE | Admit: 2014-10-14 | Discharge: 2014-10-14 | Disposition: A | Discharge status: Home / Self Care | Payer: Managed Care, Other (non HMO) | Source: Ambulatory Visit | Attending: Internal Medicine | Admitting: Internal Medicine

## 2014-10-14 ENCOUNTER — Encounter (HOSPITAL_COMMUNITY)
Admission: RE | Disposition: A | Discharge status: Home / Self Care | Payer: Self-pay | Source: Ambulatory Visit | Attending: Internal Medicine

## 2014-10-14 ENCOUNTER — Encounter (HOSPITAL_COMMUNITY): Payer: Self-pay | Admitting: *Deleted

## 2014-10-14 DIAGNOSIS — K219 Gastro-esophageal reflux disease without esophagitis: Secondary | ICD-10-CM | POA: Insufficient documentation

## 2014-10-14 DIAGNOSIS — Z880 Allergy status to penicillin: Secondary | ICD-10-CM | POA: Diagnosis not present

## 2014-10-14 DIAGNOSIS — Z882 Allergy status to sulfonamides status: Secondary | ICD-10-CM | POA: Diagnosis not present

## 2014-10-14 DIAGNOSIS — Z8673 Personal history of transient ischemic attack (TIA), and cerebral infarction without residual deficits: Secondary | ICD-10-CM | POA: Diagnosis not present

## 2014-10-14 DIAGNOSIS — R1013 Epigastric pain: Secondary | ICD-10-CM | POA: Diagnosis not present

## 2014-10-14 DIAGNOSIS — Z91041 Radiographic dye allergy status: Secondary | ICD-10-CM | POA: Insufficient documentation

## 2014-10-14 DIAGNOSIS — M797 Fibromyalgia: Secondary | ICD-10-CM | POA: Insufficient documentation

## 2014-10-14 DIAGNOSIS — G43909 Migraine, unspecified, not intractable, without status migrainosus: Secondary | ICD-10-CM | POA: Diagnosis not present

## 2014-10-14 DIAGNOSIS — K317 Polyp of stomach and duodenum: Secondary | ICD-10-CM | POA: Insufficient documentation

## 2014-10-14 DIAGNOSIS — R1310 Dysphagia, unspecified: Secondary | ICD-10-CM | POA: Diagnosis present

## 2014-10-14 DIAGNOSIS — Z9851 Tubal ligation status: Secondary | ICD-10-CM | POA: Diagnosis not present

## 2014-10-14 DIAGNOSIS — G8929 Other chronic pain: Secondary | ICD-10-CM

## 2014-10-14 HISTORY — PX: ESOPHAGOGASTRODUODENOSCOPY: SHX5428

## 2014-10-14 SURGERY — EGD (ESOPHAGOGASTRODUODENOSCOPY)
Anesthesia: Moderate Sedation

## 2014-10-14 MED ORDER — SODIUM CHLORIDE 0.9 % IV SOLN
INTRAVENOUS | Status: DC
  Administered 2014-10-14: 12:00:00 via INTRAVENOUS

## 2014-10-14 MED ORDER — MEPERIDINE HCL 50 MG/ML IJ SOLN
INTRAMUSCULAR | Status: AC
  Filled 2014-10-14: qty 1

## 2014-10-14 MED ORDER — MIDAZOLAM HCL 5 MG/5ML IJ SOLN
INTRAMUSCULAR | Status: DC | PRN
  Administered 2014-10-14 (×2): 2 mg via INTRAVENOUS
  Administered 2014-10-14: 3 mg via INTRAVENOUS
  Administered 2014-10-14 (×4): 2 mg via INTRAVENOUS

## 2014-10-14 MED ORDER — STERILE WATER FOR IRRIGATION IR SOLN
Status: DC | PRN
  Administered 2014-10-14: 13:00:00

## 2014-10-14 MED ORDER — DICYCLOMINE HCL 10 MG PO CAPS: ORAL_CAPSULE | ORAL | Status: DC

## 2014-10-14 MED ORDER — MIDAZOLAM HCL 5 MG/5ML IJ SOLN
INTRAMUSCULAR | Status: AC
  Filled 2014-10-14: qty 5

## 2014-10-14 MED ORDER — BUTAMBEN-TETRACAINE-BENZOCAINE 2-2-14 % EX AERO
INHALATION_SPRAY | CUTANEOUS | Status: DC | PRN
  Administered 2014-10-14: 2 via TOPICAL

## 2014-10-14 MED ORDER — MEPERIDINE HCL 50 MG/ML IJ SOLN
INTRAMUSCULAR | Status: DC | PRN
  Administered 2014-10-14: 25 mg via INTRAVENOUS

## 2014-10-14 MED ORDER — MIDAZOLAM HCL 5 MG/5ML IJ SOLN
INTRAMUSCULAR | Status: AC
  Filled 2014-10-14: qty 10

## 2014-10-14 NOTE — Op Note (Signed)
EGD PROCEDURE REPORT  PATIENT:  Erin Murray  MR#:  [id number] Birthdate:  10/16/1970, 44 y.o., female Endoscopist:  Dr. Rogene Houston, MD Referred By:  Dr. Celedonio Savage, MD Procedure Date: 10/14/2014  Procedure:   EGD  Indications:  Patient is 44 year old Caucasian female who presents with few months history of postprandial epigastric pain associated with nausea. Pain is been gradually getting worse of the last few months but she is been learning to live with it. She had an episode of severe pain but 4 weeks ago when she was seen in emergency room and had unremarkable abdominal pelvic CT as well as normal CBC LFTs and serum calcium. She has noted minimal relief with doubling of her PPI and sucralfate. She is undergoing diagnostic EGD.            Informed Consent:  The risks, benefits, alternatives & imponderables which include, but are not limited to, bleeding, infection, perforation, drug reaction and potential missed lesion have been reviewed.  The potential for biopsy, lesion removal, esophageal dilation, etc. have also been discussed.  Questions have been answered.  All parties agreeable.  Please see history & physical in medical record for more information.  Medications:  Demerol 50 mg IV Versed 15 mg IV Cetacaine spray topically for oropharyngeal anesthesia  Description of procedure:  The endoscope was introduced through the mouth and advanced to the second portion of the duodenum without difficulty or limitations. The mucosal surfaces were surveyed very carefully during advancement of the scope and upon withdrawal.  Findings:  Esophagus:  Mucosa of the esophagus was normal. GE junction was unremarkable.37  GEJ:  37 cm Stomach:  Stomach was empty and distended very well with insufflation. 4 small polyps are noted at gastric body. The largest one was about 7 mm. Antral mucosa was normal without erosions or ulceration. Pyloric channel was patent. Angularis fundus and cardia with examined  by retroflex of the scope and were normal. Duodenum:  Normal bulbar and post bulbar mucosa.  Therapeutic/Diagnostic Maneuvers Performed:  2 of the gastric polyps were biopsied for routine histology.  Complications:  None  Impression: No evidence of reflux esophagitis peptic ulcer disease or gastritis. 4 small polyps and gastric body with appearance typical of hyperplastic polyps. Biopsy taken for histologic confirmation.  Comment; No abnormality noted to account for patient's symptoms. Need to rule out cholelithiasis. Other possibilities include dyspepsia and abdominal migraine.  Recommendations:  Standard instructions given. Dicyclomine 10 mg by mouth 30 minutes before each meal. Upper abdominal ultrasound to be scheduled. I will be contacting patient with biopsy results.  REHMAN,NAJEEB U  10/14/2014  1:21 PM  CC: Dr. Celedonio Savage, MD & Dr. Rayne Du ref. provider found

## 2014-10-14 NOTE — Discharge Instructions (Signed)
Resume usual medications and diet. Dicyclomine 10 mg by mouth 15-30 minutes before each meal. No driving for 24 hours. Physician will call with biopsy results.  Esophagogastroduodenoscopy Care After Refer to this sheet in the next few weeks. These instructions provide you with information on caring for yourself after your procedure. Your caregiver may also give you more specific instructions. Your treatment has been planned according to current medical practices, but problems sometimes occur. Call your caregiver if you have any problems or questions after your procedure.  HOME CARE INSTRUCTIONS  Do not eat or drink anything until the numbing medicine (local anesthetic) has worn off and your gag reflex has returned. You will know that the local anesthetic has worn off when you can swallow comfortably.  Do not drive for 12 hours after the procedure or as directed by your caregiver.  Only take medicines as directed by your caregiver. SEEK MEDICAL CARE IF:   You cannot stop coughing.  You are not urinating at all or less than usual. SEEK IMMEDIATE MEDICAL CARE IF:  You have difficulty swallowing.  You cannot eat or drink.  You have worsening throat or chest pain.  You have dizziness, lightheadedness, or you faint.  You have nausea or vomiting.  You have chills.  You have a fever.  You have severe abdominal pain.  You have black, tarry, or bloody stools. Document Released: 06/17/2012 Document Reviewed: 06/17/2012 [facility] Patient Information 2015 [place]. This information is not intended to replace advice given to you by your health care provider. Make sure you discuss any questions you have with your health care provider.

## 2014-10-14 NOTE — H&P (Signed)
Erin Murray is an 44 y.o. female.   Chief Complaint: Patient is here for EGD. HPI: This 44 year old Caucasian female who presents with few months history of epigastric pain made worse with meals. She's had nausea but no vomiting. She denies melena or rectal bleeding. She's been treated with Prilosec and sucralfate with minimal relief of her pain. She was seen in emergency room about 4 weeks ago CBC and LFTs, serum calcium and serum lipase are normal. She had abdominopelvic CT and no abnormality found to account for symptoms. Patient has been under a lot of stress in the last 3 months because her sister-in-law died of gallbladder cancer in her husband had MI.   Past Medical History  Diagnosis Date  . Reflux   . Fibromyalgia   . Headache(784.0)   . TIA (transient ischemic attack) 2010  . Seizures   . Fibromyalgia   . Complicated migraine     includes: visual changes, left sided weakness, confusion, weakness, dizziness    Past Surgical History  Procedure Laterality Date  . Ovarian cyst drainage    . Tubal ligation      Family History  Problem Relation Age of Onset  . High Cholesterol Father   . Heart disease Father   . High Cholesterol Mother   . Asthma Mother   . COPD Maternal Grandfather    Social History:  reports that she has never smoked. She has never used smokeless tobacco. She reports that she does not drink alcohol or use illicit drugs.  Allergies:  Allergies  Allergen Reactions  . Sulfa Antibiotics Nausea And Vomiting and Other (See Comments)    REACTION: Family members "almost died" taking this medication  . Penicillins Hives  . Other Cough    Iv dye     Medications Prior to Admission  Medication Sig Dispense Refill  . clindamycin (CLEOCIN) 150 MG capsule Take 300 mg by mouth 4 (four) times daily.    . cyanocobalamin (,VITAMIN B-12,) 1000 MCG/ML injection Inject 1,000 mcg into the muscle every 30 (thirty) days.     Marland Kitchen gabapentin (NEURONTIN) 300 MG capsule  Take 400 mg by mouth at bedtime.     Marland Kitchen MELATONIN ER PO Take 10 mg by mouth at bedtime.     Marland Kitchen omeprazole (PRILOSEC) 20 MG capsule Take 1 capsule (20 mg total) by mouth 2 (two) times daily before a meal. 60 capsule 2  . sucralfate (CARAFATE) 1 GM/10ML suspension Take 10 mLs (1 g total) by mouth 4 (four) times daily -  with meals and at bedtime. 420 mL 1  . topiramate (TOPAMAX) 50 MG tablet Take 2 tablets (100 mg total) by mouth 2 (two) times daily. One po bid xone week, then 2 tabs po bid (Patient taking differently: Take 100 mg by mouth 2 (two) times daily. ) 120 tablet 12  . albuterol (PROVENTIL HFA;VENTOLIN HFA) 108 (90 BASE) MCG/ACT inhaler Inhale 2 puffs into the lungs every 6 (six) hours as needed for wheezing or shortness of breath. 1 Inhaler 2  . rizatriptan (MAXALT-MLT) 10 MG disintegrating tablet Take 1 tablet (10 mg total) by mouth as needed for migraine. May repeat in 2 hours if needed 15 tablet 6    No results found for this or any previous visit (from the past 48 hour(s)). No results found.  ROS  Blood pressure 123/72, pulse 86, temperature 97.9 F (36.6 C), temperature source Oral, resp. rate 17, last menstrual period 09/28/2014, SpO2 96 %. Physical Exam  Constitutional: She  appears well-developed and well-nourished.  HENT:  Mouth/Throat: Oropharynx is clear and moist.  Eyes: Conjunctivae are normal. No scleral icterus.  Neck: No thyromegaly present.  Cardiovascular: Normal rate, regular rhythm and normal heart sounds.   No murmur heard. Respiratory: Effort normal and breath sounds normal.  GI: Soft. She exhibits no mass. Tenderness: mild midepigastric tenderness.  Musculoskeletal: She exhibits no edema.  Lymphadenopathy:    She has no cervical adenopathy.  Neurological: She is alert.  Skin: Skin is warm and dry.     Assessment/Plan Epigastric pain unresponsive to therapy. Diagnostic EGD.  Neytiri Asche U 10/14/2014, 12:49 PM

## 2014-10-17 ENCOUNTER — Encounter (HOSPITAL_COMMUNITY): Payer: Self-pay | Admitting: Internal Medicine

## 2014-10-17 ENCOUNTER — Other Ambulatory Visit (INDEPENDENT_AMBULATORY_CARE_PROVIDER_SITE_OTHER): Payer: Self-pay | Admitting: Internal Medicine

## 2014-10-17 DIAGNOSIS — G8929 Other chronic pain: Secondary | ICD-10-CM

## 2014-10-17 DIAGNOSIS — R110 Nausea: Secondary | ICD-10-CM

## 2014-10-17 DIAGNOSIS — R1013 Epigastric pain: Principal | ICD-10-CM

## 2014-10-19 ENCOUNTER — Ambulatory Visit (HOSPITAL_COMMUNITY)
Admission: RE | Admit: 2014-10-19 | Discharge: 2014-10-19 | Disposition: A | Discharge status: Home / Self Care | Payer: Managed Care, Other (non HMO) | Source: Ambulatory Visit | Attending: Internal Medicine | Admitting: Internal Medicine

## 2014-10-19 DIAGNOSIS — K828 Other specified diseases of gallbladder: Secondary | ICD-10-CM | POA: Insufficient documentation

## 2014-10-19 DIAGNOSIS — K219 Gastro-esophageal reflux disease without esophagitis: Secondary | ICD-10-CM | POA: Diagnosis not present

## 2014-10-19 DIAGNOSIS — N281 Cyst of kidney, acquired: Secondary | ICD-10-CM | POA: Insufficient documentation

## 2014-10-19 DIAGNOSIS — N289 Disorder of kidney and ureter, unspecified: Secondary | ICD-10-CM | POA: Diagnosis not present

## 2014-10-19 DIAGNOSIS — G8929 Other chronic pain: Secondary | ICD-10-CM

## 2014-10-19 DIAGNOSIS — M797 Fibromyalgia: Secondary | ICD-10-CM | POA: Insufficient documentation

## 2014-10-19 DIAGNOSIS — R110 Nausea: Secondary | ICD-10-CM | POA: Insufficient documentation

## 2014-10-19 DIAGNOSIS — R1013 Epigastric pain: Secondary | ICD-10-CM

## 2014-10-19 DIAGNOSIS — R109 Unspecified abdominal pain: Secondary | ICD-10-CM | POA: Diagnosis present

## 2014-10-24 ENCOUNTER — Other Ambulatory Visit (INDEPENDENT_AMBULATORY_CARE_PROVIDER_SITE_OTHER): Payer: Self-pay | Admitting: Internal Medicine

## 2014-10-24 DIAGNOSIS — R110 Nausea: Secondary | ICD-10-CM

## 2014-10-24 DIAGNOSIS — R109 Unspecified abdominal pain: Secondary | ICD-10-CM

## 2014-10-24 DIAGNOSIS — R935 Abnormal findings on diagnostic imaging of other abdominal regions, including retroperitoneum: Secondary | ICD-10-CM

## 2014-10-25 ENCOUNTER — Encounter (INDEPENDENT_AMBULATORY_CARE_PROVIDER_SITE_OTHER): Payer: Self-pay | Admitting: *Deleted

## 2014-10-28 ENCOUNTER — Encounter (HOSPITAL_COMMUNITY): Payer: Self-pay

## 2014-10-28 ENCOUNTER — Encounter (HOSPITAL_COMMUNITY)
Admission: RE | Admit: 2014-10-28 | Discharge: 2014-10-28 | Disposition: A | Discharge status: Home / Self Care | Payer: Managed Care, Other (non HMO) | Source: Ambulatory Visit | Attending: Internal Medicine | Admitting: Internal Medicine

## 2014-10-28 DIAGNOSIS — R110 Nausea: Secondary | ICD-10-CM | POA: Diagnosis present

## 2014-10-28 DIAGNOSIS — R935 Abnormal findings on diagnostic imaging of other abdominal regions, including retroperitoneum: Secondary | ICD-10-CM | POA: Diagnosis present

## 2014-10-28 DIAGNOSIS — R109 Unspecified abdominal pain: Secondary | ICD-10-CM | POA: Diagnosis present

## 2014-10-28 MED ORDER — SINCALIDE 5 MCG IJ SOLR
INTRAMUSCULAR | Status: AC
  Administered 2014-10-28: 1.93 ug via INTRAVENOUS
  Filled 2014-10-28: qty 5

## 2014-10-28 MED ORDER — STERILE WATER FOR INJECTION IJ SOLN
INTRAMUSCULAR | Status: AC
  Administered 2014-10-28: 1.93 mL via INTRAVENOUS
  Filled 2014-10-28: qty 10

## 2014-10-28 MED ORDER — TECHNETIUM TC 99M MEBROFENIN IV KIT
5.0000 | PACK | Freq: Once | INTRAVENOUS | Status: AC | PRN
  Administered 2014-10-28: 5 via INTRAVENOUS

## 2014-10-28 MED ORDER — SODIUM CHLORIDE 0.9 % IJ SOLN
INTRAMUSCULAR | Status: AC
Start: 2014-10-28 — End: 2014-10-28
  Filled 2014-10-28: qty 12

## 2015-05-03 ENCOUNTER — Emergency Department (HOSPITAL_COMMUNITY)
Admission: EM | Admit: 2015-05-03 | Discharge: 2015-05-03 | Disposition: A | Discharge status: Home / Self Care | Payer: Managed Care, Other (non HMO) | Attending: Emergency Medicine | Admitting: Emergency Medicine

## 2015-05-03 ENCOUNTER — Emergency Department (HOSPITAL_COMMUNITY): Payer: Managed Care, Other (non HMO)

## 2015-05-03 ENCOUNTER — Encounter (HOSPITAL_COMMUNITY): Payer: Self-pay | Admitting: Emergency Medicine

## 2015-05-03 DIAGNOSIS — M797 Fibromyalgia: Secondary | ICD-10-CM | POA: Insufficient documentation

## 2015-05-03 DIAGNOSIS — J189 Pneumonia, unspecified organism: Secondary | ICD-10-CM

## 2015-05-03 DIAGNOSIS — K219 Gastro-esophageal reflux disease without esophagitis: Secondary | ICD-10-CM | POA: Insufficient documentation

## 2015-05-03 DIAGNOSIS — R000 Tachycardia, unspecified: Secondary | ICD-10-CM | POA: Insufficient documentation

## 2015-05-03 DIAGNOSIS — Z79899 Other long term (current) drug therapy: Secondary | ICD-10-CM | POA: Insufficient documentation

## 2015-05-03 DIAGNOSIS — Z8673 Personal history of transient ischemic attack (TIA), and cerebral infarction without residual deficits: Secondary | ICD-10-CM | POA: Insufficient documentation

## 2015-05-03 DIAGNOSIS — G43909 Migraine, unspecified, not intractable, without status migrainosus: Secondary | ICD-10-CM | POA: Insufficient documentation

## 2015-05-03 DIAGNOSIS — Z880 Allergy status to penicillin: Secondary | ICD-10-CM | POA: Insufficient documentation

## 2015-05-03 DIAGNOSIS — G40909 Epilepsy, unspecified, not intractable, without status epilepticus: Secondary | ICD-10-CM | POA: Insufficient documentation

## 2015-05-03 DIAGNOSIS — J159 Unspecified bacterial pneumonia: Secondary | ICD-10-CM | POA: Insufficient documentation

## 2015-05-03 MED ORDER — LEVOFLOXACIN 500 MG PO TABS: 500.0000 mg | ORAL_TABLET | Freq: Every day | ORAL | Status: DC

## 2015-05-03 MED ORDER — PREDNISONE 10 MG PO TABS: ORAL_TABLET | ORAL | Status: DC

## 2015-05-03 MED ORDER — ALBUTEROL SULFATE HFA 108 (90 BASE) MCG/ACT IN AERS
2.0000 | INHALATION_SPRAY | Freq: Once | RESPIRATORY_TRACT | Status: AC
  Administered 2015-05-03: 2 via RESPIRATORY_TRACT
  Filled 2015-05-03: qty 6.7

## 2015-05-03 MED ORDER — LEVOFLOXACIN 750 MG PO TABS
750.0000 mg | ORAL_TABLET | Freq: Once | ORAL | Status: AC
  Administered 2015-05-03: 750 mg via ORAL
  Filled 2015-05-03: qty 1

## 2015-05-03 MED ORDER — HYDROCODONE-HOMATROPINE 5-1.5 MG/5ML PO SYRP: 5.0000 mL | ORAL_SOLUTION | Freq: Four times a day (QID) | ORAL | Status: DC | PRN

## 2015-05-03 MED ORDER — HYDROCOD POLST-CPM POLST ER 10-8 MG/5ML PO SUER
5.0000 mL | Freq: Once | ORAL | Status: AC
  Administered 2015-05-03: 5 mL via ORAL
  Filled 2015-05-03: qty 5

## 2015-05-03 MED ORDER — ACETAMINOPHEN 500 MG PO TABS
1000.0000 mg | ORAL_TABLET | Freq: Once | ORAL | Status: AC
  Administered 2015-05-03: 1000 mg via ORAL
  Filled 2015-05-03: qty 2

## 2015-05-03 MED ORDER — PREDNISONE 50 MG PO TABS
60.0000 mg | ORAL_TABLET | Freq: Once | ORAL | Status: AC
  Administered 2015-05-03: 60 mg via ORAL
  Filled 2015-05-03 (×2): qty 1

## 2015-05-03 NOTE — Discharge Instructions (Signed)
Please increase fluids. Use your mask over the next 7 days. Please see Dr. Wenda Overland in 7-10 days for repeat chest x-ray for follow-up of your pneumonia. Please use Levaquin and prednisone daily. Use albuterol 2 puffs every 4 hours. Use Hycodan for cough. This medication may cause drowsiness, please do not drink alcohol, drive, or participate in activity requiring concentration when taking this medication. Community-Acquired Pneumonia, Adult Pneumonia is an infection of the lungs. One type of pneumonia can happen while a person is in a hospital. A different type can happen when a person is not in a hospital (community-acquired pneumonia). It is easy for this kind to spread from person to person. It can spread to you if you breathe near an infected person who coughs or sneezes. Some symptoms include:  A dry cough.  A wet (productive) cough.  Fever.  Sweating.  Chest pain. HOME CARE  Take over-the-counter and prescription medicines only as told by your doctor.  Only take cough medicine if you are losing sleep.  If you were prescribed an antibiotic medicine, take it as told by your doctor. Do not stop taking the antibiotic even if you start to feel better.  Sleep with your head and neck raised (elevated). You can do this by putting a few pillows under your head, or you can sleep in a recliner.  Do not use tobacco products. These include cigarettes, chewing tobacco, and e-cigarettes. If you need help quitting, ask your doctor.  Drink enough water to keep your pee (urine) clear or pale yellow. A shot (vaccine) can help prevent pneumonia. Shots are often suggested for:  People older than 44 years of age.  People older than 44 years of age:  Who are having cancer treatment.  Who have long-term (chronic) lung disease.  Who have problems with their body's defense system (immune system). You may also prevent pneumonia if you take these actions:  Get the flu (influenza) shot every year.  Go  to the dentist as often as told.  Wash your hands often. If soap and water are not available, use hand sanitizer. GET HELP IF:  You have a fever.  You lose sleep because your cough medicine does not help. GET HELP RIGHT AWAY IF:  You are short of breath and it gets worse.  You have more chest pain.  Your sickness gets worse. This is very serious if:  You are an older adult.  Your body's defense system is weak.  You cough up blood.   This information is not intended to replace advice given to you by your health care provider. Make sure you discuss any questions you have with your health care provider.   Document Released: 12/18/2007 Document Revised: 03/22/2015 Document Reviewed: 10/26/2014 Elsevier Interactive Patient Education [employer].

## 2015-05-03 NOTE — ED Provider Notes (Signed)
CSN: [id number]     Arrival date & time 05/03/15  1430 History   First MD Initiated Contact with Patient 05/03/15 1509     Chief Complaint  Patient presents with  . Cough     (Consider location/radiation/quality/duration/timing/severity/associated sxs/prior Treatment) Patient is a 44 y.o. female presenting with cough. The history is provided by the patient.  Cough Cough characteristics:  Non-productive Severity:  Moderate Onset quality:  Gradual Duration:  1 week Timing:  Intermittent Progression:  Worsening Chronicity:  New Smoker: no   Context: upper respiratory infection and weather changes   Relieved by:  Nothing Associated symptoms: chills, fever, myalgias, shortness of breath and wheezing   Risk factors: no recent travel     Past Medical History  Diagnosis Date  . Reflux   . Fibromyalgia   . Headache(784.0)   . TIA (transient ischemic attack) 2010  . Seizures ([place])   . Fibromyalgia   . Complicated migraine     includes: visual changes, left sided weakness, confusion, weakness, dizziness   Past Surgical History  Procedure Laterality Date  . Ovarian cyst drainage    . Tubal ligation    . Esophagogastroduodenoscopy N/A 10/14/2014    Procedure: ESOPHAGOGASTRODUODENOSCOPY (EGD);  Surgeon: Rogene Houston, MD;  Location: [facility];  Service: Endoscopy;  Laterality: N/A;  210 - moved to 12:45 - Ann to notify pt   Family History  Problem Relation Age of Onset  . High Cholesterol Father   . Heart disease Father   . High Cholesterol Mother   . Asthma Mother   . COPD Maternal Grandfather    Social History  Substance Use Topics  . Smoking status: Never Smoker   . Smokeless tobacco: Never Used  . Alcohol Use: No   OB History    No data available     Review of Systems  Constitutional: Positive for fever, chills and appetite change.  Respiratory: Positive for cough, shortness of breath and wheezing.   Musculoskeletal: Positive for myalgias.  All other  systems reviewed and are negative.     Allergies  Sulfa antibiotics; Penicillins; and Other  Home Medications   Prior to Admission medications   Medication Sig Start Date End Date Taking? Authorizing Provider  gabapentin (NEURONTIN) 300 MG capsule Take 300 mg by mouth at bedtime.    Yes Historical Provider, MD  Melatonin 10 MG TABS Take 10 mg by mouth at bedtime.   Yes Historical Provider, MD  omeprazole (PRILOSEC) 20 MG capsule Take 1 capsule (20 mg total) by mouth 2 (two) times daily before a meal. 10/04/14  Yes Butch Penny, NP  topiramate (TOPAMAX) 50 MG tablet Take 2 tablets (100 mg total) by mouth 2 (two) times daily. One po bid xone week, then 2 tabs po bid Patient taking differently: Take 100 mg by mouth 2 (two) times daily.  12/30/12  Yes Marcial Pacas, MD  albuterol (PROVENTIL HFA;VENTOLIN HFA) 108 (90 BASE) MCG/ACT inhaler Inhale 2 puffs into the lungs every 6 (six) hours as needed for wheezing or shortness of breath. 07/10/14   Ezequiel Essex, MD  cyanocobalamin (,VITAMIN B-12,) 1000 MCG/ML injection Inject 1,000 mcg into the muscle every 30 (thirty) days.  06/01/14   Historical Provider, MD  dicyclomine (BENTYL) 10 MG capsule Take 1 capsule by mouth 15-30 minutes before each meal. Patient not taking: Reported on 05/03/2015 10/14/14   Rogene Houston, MD  rizatriptan (MAXALT-MLT) 10 MG disintegrating tablet Take 1 tablet (10 mg total) by mouth as  needed for migraine. May repeat in 2 hours if needed 12/30/12   Marcial Pacas, MD   BP 123/95 mmHg  Pulse 108  Temp(Src) 98.4 F (36.9 C) (Oral)  Resp 20  Ht 5\' 5"  (1.651 m)  Wt 210 lb (95.255 kg)  BMI 34.95 kg/m2  SpO2 98%  LMP 05/01/2015 Physical Exam  Constitutional: She is oriented to person, place, and time. She appears well-developed and well-nourished.  Non-toxic appearance.  HENT:  Head: Normocephalic.  Right Ear: Tympanic membrane and external ear normal.  Left Ear: Tympanic membrane and external ear normal.  Eyes: EOM and  lids are normal. Pupils are equal, round, and reactive to light.  Neck: Normal range of motion. Neck supple. Carotid bruit is not present.  Cardiovascular: Regular rhythm, normal heart sounds, intact distal pulses and normal pulses.  Tachycardia present.   There is symmetrical rise and fall of the chest. Patient speaks in complete sentences. There are bilateral wheezes, and rhonchi.  Pulmonary/Chest: Effort normal. No respiratory distress. She has no decreased breath sounds. She has wheezes. She has rhonchi.  Abdominal: Soft. Bowel sounds are normal. There is no tenderness. There is no guarding.  Musculoskeletal: Normal range of motion.  Lymphadenopathy:       Head (right side): No submandibular adenopathy present.       Head (left side): No submandibular adenopathy present.    She has no cervical adenopathy.  Neurological: She is alert and oriented to person, place, and time. She has normal strength. No cranial nerve deficit or sensory deficit.  Skin: Skin is warm and dry.  Psychiatric: She has a normal mood and affect. Her speech is normal.  Nursing note and vitals reviewed.   ED Course  Procedures (including critical care time) Labs Review Labs Reviewed - No data to display  Imaging Review Dg Chest 2 View  05/03/2015  CLINICAL DATA:  Cough and fever for 1 week. Congestion and shortness of breath. EXAM: CHEST  2 VIEW COMPARISON:  07/09/2014 FINDINGS: The cardiomediastinal silhouette is within normal limits. There is consolidative opacity in the anterior left upper lobe and lingula which is new from the prior study. The right lung is clear. No pleural effusion or pneumothorax is identified. Right upper quadrant abdominal surgical clips are noted. No acute osseous abnormality is seen. IMPRESSION: Left upper lobe consolidation most likely reflecting pneumonia given symptoms. Followup PA and lateral chest X-ray is recommended in 3-4 weeks following trial of antibiotic therapy to ensure  resolution and exclude underlying malignancy. Electronically Signed   By: Logan Bores M.D.   On: 05/03/2015 15:21   I have personally reviewed and evaluated these images and lab results as part of my medical decision-making.   EKG Interpretation None      MDM  Vital signs reviewed. X-rays consistent with left lung pneumonia. The patient is awake and alert, and speaks in complete sentences. The patient has a temperature max of 102, temperature is 98.4 this time. Pulse oximetry is 98% on room air.  The patient will be treated with Levaquin, prednisone taper, albuterol, and Hycodan. Patient to see her primary physician for recheck in 7-10 days.    Final diagnoses:  None    *I have reviewed nursing notes, vital signs, and all appropriate lab and imaging results for this patient.Lily Kocher, PA-C 05/03/15 [place], MD 05/05/15 1041

## 2015-05-03 NOTE — ED Notes (Signed)
Pt states that she has been having cough and fever for about 1 week , with fever >102 - Congestion but not coughing up much

## 2015-05-23 DIAGNOSIS — J069 Acute upper respiratory infection, unspecified: Secondary | ICD-10-CM

## 2015-05-23 NOTE — Congregational Nurse Program (Unsigned)
Client came to the [facility] on October 19,2016 with complaint of cough, fever and flu-like symptoms x 10 days. States she had some Keflex and a breathing treatment which was actually prescribed for another family member. States she has no insurance and can't go to PCP. She is presently unemployed. Referred to ED for further evaluation due to acute symptoms and some shortness of breath.

## 2015-07-01 IMAGING — US US ABDOMEN COMPLETE
1 series · 13 of 25 positions shown · non-contrast
Comparison: CT scan of the abdomen and pelvis of September 17, 2014

CLINICAL DATA: Chronic abdominal pain with nausea; history of
reflux, fibromyalgia, and right renal lesion on CT scan September 17, 2014

EXAM:
ULTRASOUND ABDOMEN COMPLETE

[Series 1: us abdomen complete · 0.20mm/px · 13 of 96 slices shown]
[im 1/96]
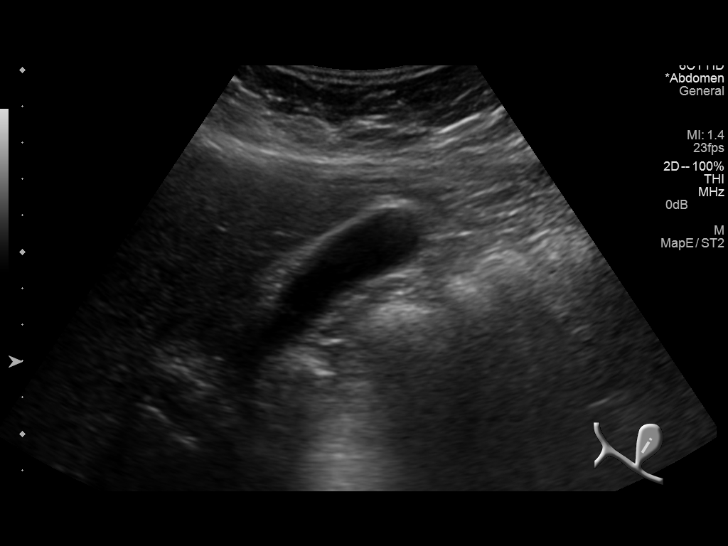
[im 8/96]
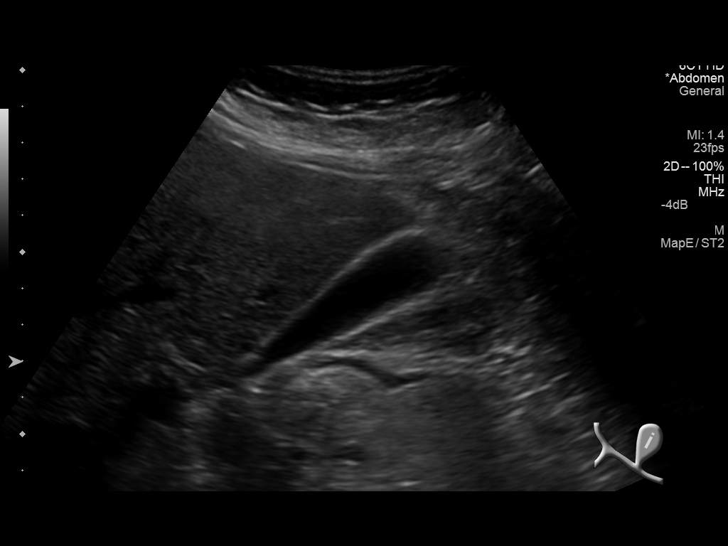
[im 16/96]
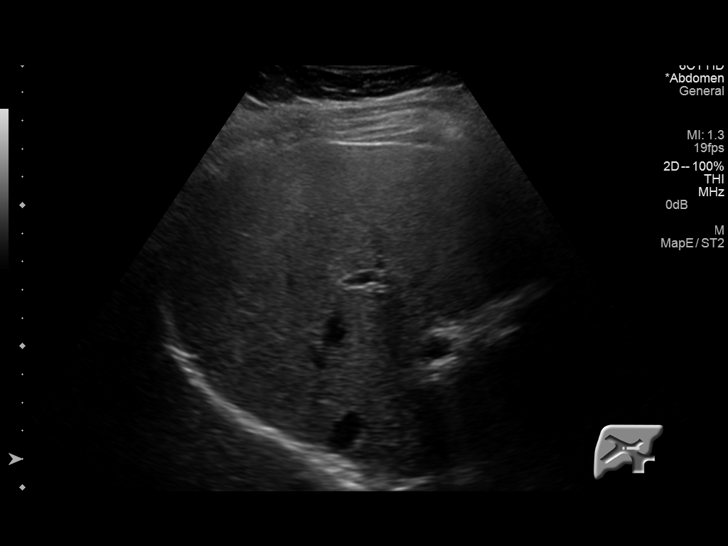
[im 24/96]
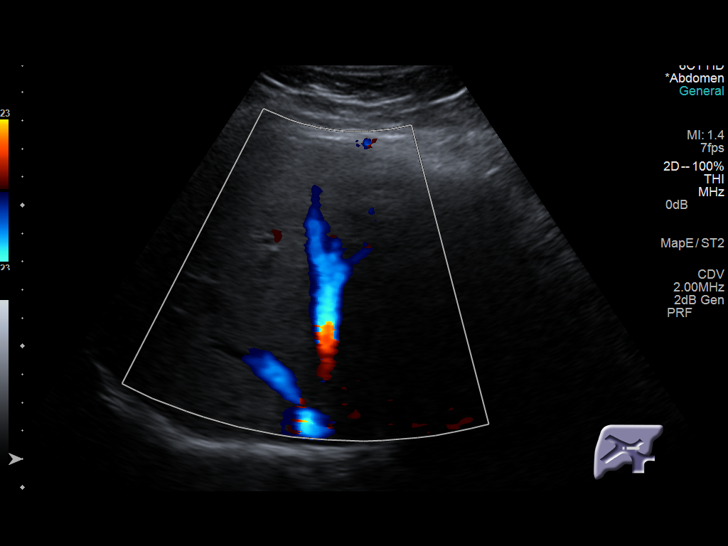
[im 32/96]
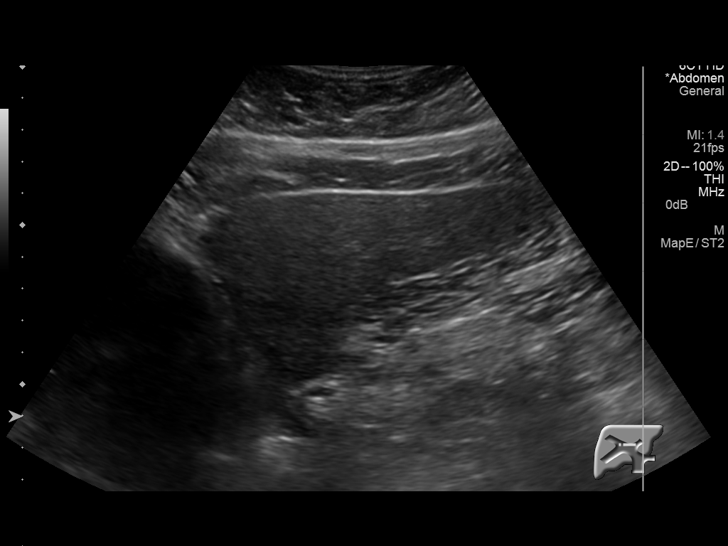
[im 40/96]
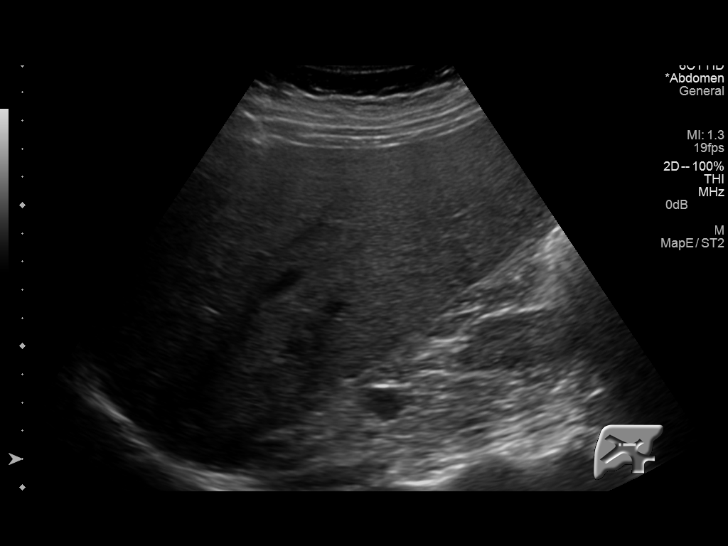
[im 48/96]
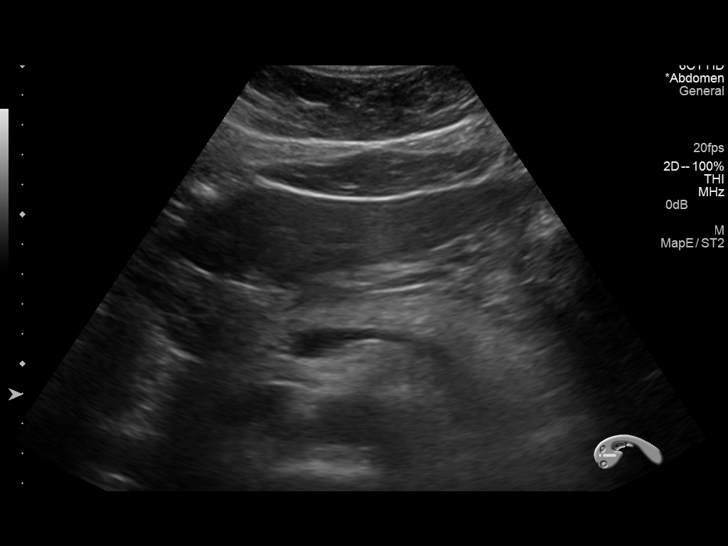
[im 56/96]
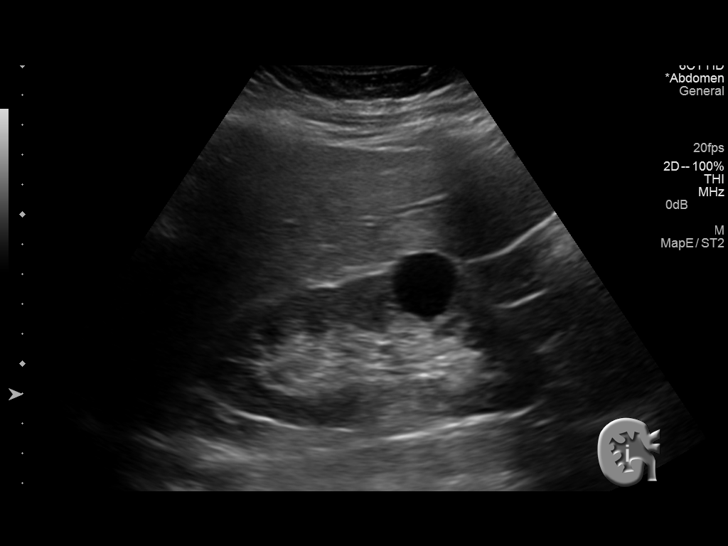
[im 64/96]
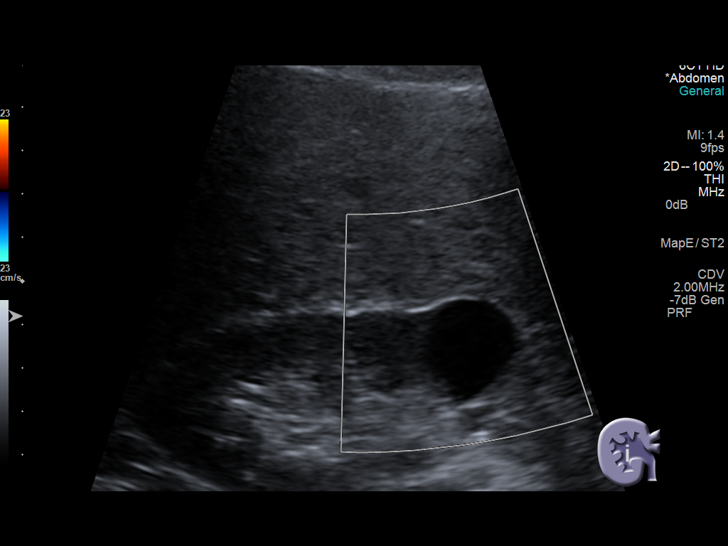
[im 72/96]
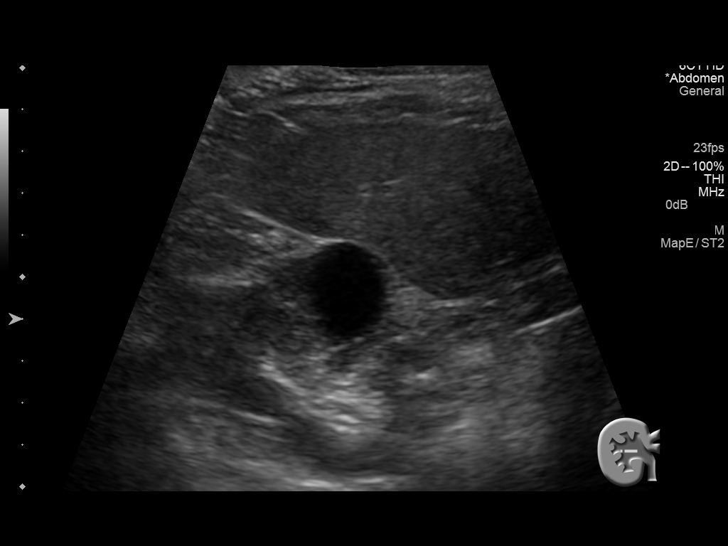
[im 80/96]
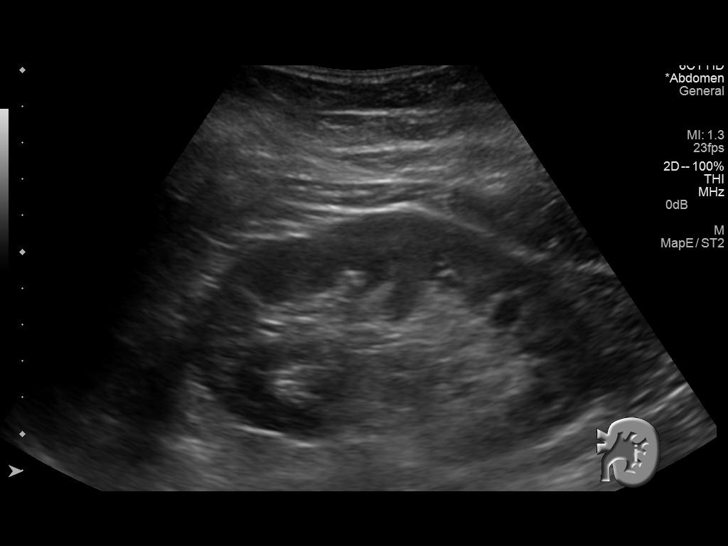
[im 88/96]
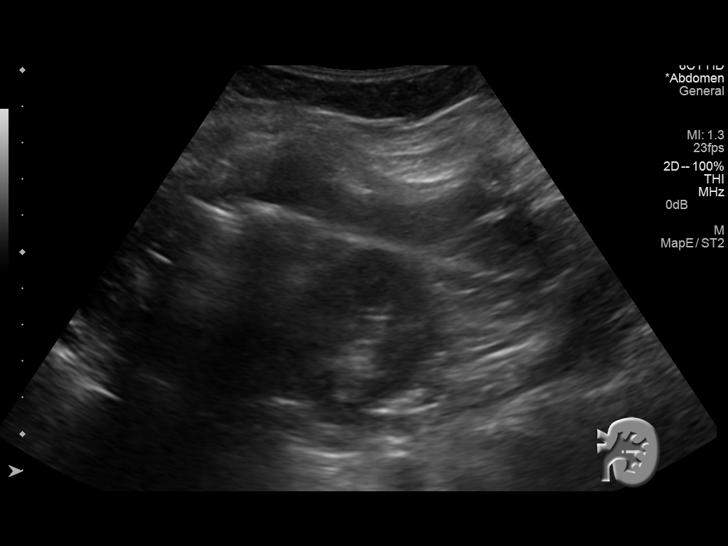
[im 96/96]
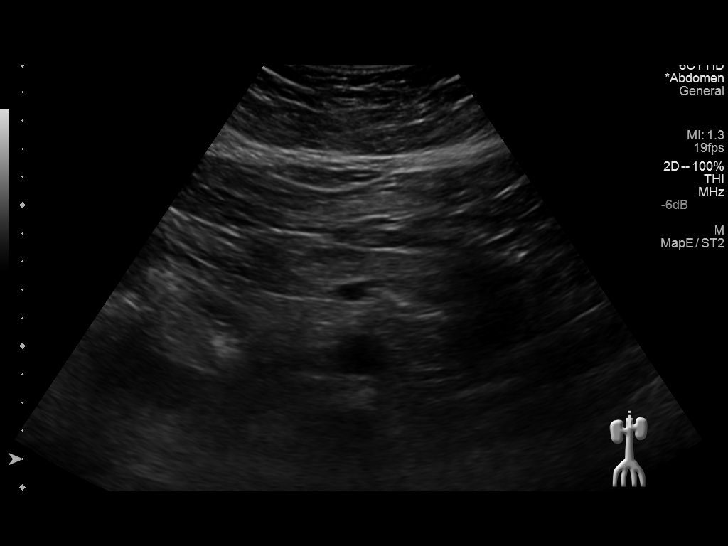

[13 of 25 positions shown; findings below may reference images not displayed]

FINDINGS: Gallbladder: The gallbladder wall exhibits thickening to 3.5 mm.
There is no intramural edema or pericholecystic fluid. No stones are
evident. There is no positive sonographic Murphy's sign.

Common bile duct: Diameter: 3.6 mm

Liver: No focal lesion identified. Within normal limits in
parenchymal echogenicity. There is no intrahepatic ductal dilation.

IVC: No abnormality visualized.

Pancreas: Visualized portion unremarkable.

Spleen: Size and appearance within normal limits.

Right Kidney: Length: 12.0 cm. In the lateral aspect of the mid to
lower pole of the left kidney there is a simple appearing cyst
measuring 2.2 x 2.5 x 1.9 cm. There is no solid mass or
hydronephrosis.

Left Kidney: Length: 11.9 cm. Echogenicity within normal limits. No
mass or hydronephrosis visualized.

Abdominal aorta: No aneurysm visualized.

Other findings: There is no ascites.
IMPRESSION: 1. Simple appearing cyst measuring 2.5 cm in diameter in the mid to
lower pole of the left kidney. This corresponds to the CT findings.
2. Mild gallbladder wall thickening without evidence of stones or
positive sonographic Murphy's sign.
3. There is no abnormality demonstrated elsewhere in the abdomen.

## 2017-02-04 ENCOUNTER — Ambulatory Visit: Payer: Self-pay | Admitting: Family

## 2017-02-12 ENCOUNTER — Ambulatory Visit: Payer: Self-pay | Admitting: Family

## 2017-03-06 ENCOUNTER — Ambulatory Visit: Payer: Self-pay | Admitting: Family

## 2018-04-01 DIAGNOSIS — N939 Abnormal uterine and vaginal bleeding, unspecified: Secondary | ICD-10-CM | POA: Insufficient documentation

## 2019-02-06 DIAGNOSIS — I10 Essential (primary) hypertension: Secondary | ICD-10-CM | POA: Insufficient documentation

## 2019-04-23 ENCOUNTER — Other Ambulatory Visit: Payer: Self-pay

## 2019-04-23 DIAGNOSIS — Z20822 Contact with and (suspected) exposure to covid-19: Secondary | ICD-10-CM

## 2019-04-25 LAB — NOVEL CORONAVIRUS, NAA: SARS-CoV-2, NAA: NOT DETECTED

## 2019-08-24 ENCOUNTER — Emergency Department (HOSPITAL_COMMUNITY): Payer: Managed Care, Other (non HMO)

## 2019-08-24 ENCOUNTER — Other Ambulatory Visit: Payer: Self-pay

## 2019-08-24 ENCOUNTER — Emergency Department (HOSPITAL_COMMUNITY)
Admission: EM | Admit: 2019-08-24 | Discharge: 2019-08-24 | Disposition: A | Discharge status: Home / Self Care | Payer: Managed Care, Other (non HMO) | Attending: Emergency Medicine | Admitting: Emergency Medicine

## 2019-08-24 ENCOUNTER — Encounter (HOSPITAL_COMMUNITY): Payer: Self-pay | Admitting: Emergency Medicine

## 2019-08-24 DIAGNOSIS — R079 Chest pain, unspecified: Secondary | ICD-10-CM | POA: Diagnosis present

## 2019-08-24 DIAGNOSIS — R0789 Other chest pain: Secondary | ICD-10-CM | POA: Diagnosis not present

## 2019-08-24 LAB — BASIC METABOLIC PANEL
Anion gap: 9 (ref 5–15)
BUN: 17 mg/dL (ref 6–20)
CO2: 23 mmol/L (ref 22–32)
Calcium: 9 mg/dL (ref 8.9–10.3)
Chloride: 106 mmol/L (ref 98–111)
Creatinine, Ser: 0.74 mg/dL (ref 0.44–1.00)
GFR calc Af Amer: >60 mL/min (ref >60)
GFR calc non Af Amer: >60 mL/min (ref >60)
Glucose, Bld: 99 mg/dL (ref 70–99)
Potassium: 4.2 mmol/L (ref 3.5–5.1)
Sodium: 138 mmol/L (ref 135–145)

## 2019-08-24 LAB — CBC
HCT: 41 % (ref 36.0–46.0)
Hemoglobin: 12.3 g/dL (ref 12.0–15.0)
MCH: 24.2 pg — ABNORMAL LOW (ref 26.0–34.0)
MCHC: 30 g/dL (ref 30.0–36.0)
MCV: 80.7 fL (ref 80.0–100.0)
Platelets: 353 10*3/uL (ref 150–400)
RBC: 5.08 MIL/uL (ref 3.87–5.11)
RDW: 15.1 % (ref 11.5–15.5)
WBC: 7.3 10*3/uL (ref 4.0–10.5)
nRBC: 0 % (ref 0.0–0.2)

## 2019-08-24 LAB — TROPONIN I (HIGH SENSITIVITY)
Troponin I (High Sensitivity): <2 ng/L (ref <18)
Troponin I (High Sensitivity): <2 ng/L (ref <18)

## 2019-08-24 MED ORDER — ALUM & MAG HYDROXIDE-SIMETH 200-200-20 MG/5ML PO SUSP
30.0000 mL | Freq: Once | ORAL | Status: AC
  Administered 2019-08-24: 30 mL via ORAL
  Filled 2019-08-24: qty 30

## 2019-08-24 MED ORDER — ONDANSETRON HCL 4 MG/2ML IJ SOLN
4.0000 mg | Freq: Once | INTRAMUSCULAR | Status: AC
  Administered 2019-08-24: 4 mg via INTRAVENOUS
  Filled 2019-08-24: qty 2

## 2019-08-24 MED ORDER — KETOROLAC TROMETHAMINE 30 MG/ML IJ SOLN
30.0000 mg | Freq: Once | INTRAMUSCULAR | Status: AC
  Administered 2019-08-24: 30 mg via INTRAVENOUS
  Filled 2019-08-24: qty 1

## 2019-08-24 MED ORDER — GABAPENTIN 300 MG PO CAPS: 300.0000 mg | ORAL_CAPSULE | Freq: Three times a day (TID) | ORAL | 0 refills | Status: DC

## 2019-08-24 NOTE — ED Triage Notes (Addendum)
Pt reports left sided chest pain with radiation to left side of neck since last night. Pt reports nausea, shortness of breath, cough. Pt given 3 baby aspirin, 1 nitro en route with ems, pt took 1 nitro at home. Pt reports pain decreased with nitro.

## 2019-08-24 NOTE — Discharge Instructions (Addendum)
Read instructions below for reasons to return to the Emergency Department. It is recommended that your follow up with your Primary Care Doctor in regards to today's visit. If you do not have a doctor, use the resource guide to help you find one.   -A referral was sent to the cardiology office here and [place].  They should be calling you within a week to schedule an appointment.  If you do not hear from them you can also call the number listed to try to schedule a follow-up appointment.  -Prescription has been sent to your pharmacy for your gabapentin to be refilled.  Please take this as prescribed.  If you need further refills you will need to get it from your neurologist or primary care doctor.  Tests performed today include: An EKG of your heart A chest x-ray Cardiac enzymes - a blood test for heart muscle damage Blood counts and electrolytes  Chest Pain (Nonspecific)  HOME CARE INSTRUCTIONS  -For the next few days, avoid physical activities that bring on chest pain. Continue physical activities as directed.  -Follow your caregiver's suggestions for further testing if your chest pain does not go away.  -Keep any follow-up appointments you made. If you do not go to an appointment, you could develop lasting (chronic) problems with pain. If there is any problem keeping an appointment, you must call to reschedule.   SEEK MEDICAL CARE IF:  You think you are having problems from the medicine you are taking. Read your medicine instructions carefully.  Your chest pain does not go away, even after treatment.  You develop a rash with blisters on your chest.   SEEK IMMEDIATE MEDICAL CARE IF:  You have increased chest pain or pain that spreads to your arm, neck, jaw, back, or belly (abdomen).  You develop shortness of breath, an increasing cough, or you are coughing up blood.  You have severe back or abdominal pain, feel sick to your stomach (nauseous) or throw up (vomit).  You develop severe  weakness, fainting, or chills.  You have an oral temperature above 102 F (38.9 C), not controlled by medicine.   THIS IS AN EMERGENCY. Do not wait to see if the pain will go away. Get medical help at once. Call 911. Do not drive yourself to the hospital.

## 2019-08-24 NOTE — ED Provider Notes (Signed)
[facility] EMERGENCY DEPARTMENT Provider Note   CSN: [id number] Arrival date & time: 08/24/19  1200     History Chief Complaint  Patient presents with  . Chest Pain    Erin Murray is a 49 y.o. female with past medical history significant for fibromyalgia, seizures, TIA presents to emergency department today via EMS with chief complaint of left-sided chest pain x 1 day. Pain started last night while she was laying in bed. Pain had been present x30 minutes when she asked her husband for one of his nitro pills and aspirin.  She states she after taking the medication the chest pain resolved.  This morning while she was at sitting at her desk working the chest pain returned.  It is located on the left side of her chest and radiates under her left breast.  She describes the pain as a pressure.  She rates the pain 7 out of 10 in severity.  She states she has had a history of similar pain in the past but has been trying to avoid coming to the hospital as her husband is a cardiac patient and she is trying to limit his Covid exposures.    Patient is also endorsing nausea, shortness of breath and non productive cough.  Patient took 3 baby aspirin, one nitro in route with EMS.  She had already taken 1 nitro at home.  She states her pain decreased with nitro. Denies dyspnea on exertion, , radiation to left/right arm, jaw or back, nausea, or diaphoresis, syncope, or palpitations.  She does not smoke, does not have history of hypertension, hyperlipidemia or diabetes. Cardiac family history includes parents having cardiac disease in their 57s. She has been evaluated by cardiology for this in the past however has not seen one in many years.  She also reports a history of tachycardia.  She is not on any medications for tachycardia and does not member the last time she was prescribed anything for it.  History provided by patient with additional history obtained from chart review.     Past Medical History:   Diagnosis Date  . Complicated migraine    includes: visual changes, left sided weakness, confusion, weakness, dizziness  . Fibromyalgia   . Fibromyalgia   . Headache(784.0)   . Reflux   . Seizures ([place])   . TIA (transient ischemic attack) 2010    Patient Active Problem List   Diagnosis Date Noted  . Reflux   . Fibromyalgia   . Headache(784.0)   . TIA (transient ischemic attack)   . Seizures ([place])   . Migraine     Past Surgical History:  Procedure Laterality Date  . ESOPHAGOGASTRODUODENOSCOPY N/A 10/14/2014   Procedure: ESOPHAGOGASTRODUODENOSCOPY (EGD);  Surgeon: Rogene Houston, MD;  Location: [facility];  Service: Endoscopy;  Laterality: N/A;  210 - moved to 12:45 - Ann to notify pt  . OVARIAN CYST DRAINAGE    . TUBAL LIGATION       OB History   No obstetric history on file.     Family History  Problem Relation Age of Onset  . High Cholesterol Father   . Heart disease Father   . High Cholesterol Mother   . Asthma Mother   . COPD Maternal Grandfather     Social History   Tobacco Use  . Smoking status: Never Smoker  . Smokeless tobacco: Never Used  Substance Use Topics  . Alcohol use: No  . Drug use: No    Home Medications  Prior to Admission medications   Medication Sig Start Date End Date Taking? Authorizing Provider  albuterol (PROVENTIL HFA;VENTOLIN HFA) 108 (90 BASE) MCG/ACT inhaler Inhale 2 puffs into the lungs every 6 (six) hours as needed for wheezing or shortness of breath. 07/10/14   Rancour, Annie Main, MD  cyanocobalamin (,VITAMIN B-12,) 1000 MCG/ML injection Inject 1,000 mcg into the muscle every 30 (thirty) days.  06/01/14   [provider]  dicyclomine (BENTYL) 10 MG capsule Take 1 capsule by mouth 15-30 minutes before each meal. Patient not taking: Reported on 05/03/2015 10/14/14   Rogene Houston, MD  gabapentin (NEURONTIN) 300 MG capsule Take 1 capsule (300 mg total) by mouth 3 (three) times daily. 08/24/19 09/23/19  Albrizze, Kaitlyn E,  PA-C  HYDROcodone-homatropine (HYCODAN) 5-1.5 MG/5ML syrup Take 5 mLs by mouth every 6 (six) hours as needed. 05/03/15   Lily Kocher, PA-C  levofloxacin (LEVAQUIN) 500 MG tablet Take 1 tablet (500 mg total) by mouth daily. 05/03/15   Lily Kocher, PA-C  Melatonin 10 MG TABS Take 10 mg by mouth at bedtime.    [provider]  omeprazole (PRILOSEC) 20 MG capsule Take 1 capsule (20 mg total) by mouth 2 (two) times daily before a meal. 10/04/14   Setzer, Rona Ravens, NP  predniSONE (DELTASONE) 10 MG tablet 5,4,3,2,1 - take with food 05/03/15   Lily Kocher, PA-C  rizatriptan (MAXALT-MLT) 10 MG disintegrating tablet Take 1 tablet (10 mg total) by mouth as needed for migraine. May repeat in 2 hours if needed 12/30/12   Marcial Pacas, MD  topiramate (TOPAMAX) 50 MG tablet Take 2 tablets (100 mg total) by mouth 2 (two) times daily. One po bid xone week, then 2 tabs po bid Patient taking differently: Take 100 mg by mouth 2 (two) times daily.  12/30/12   Marcial Pacas, MD    Allergies    Sulfa antibiotics, Penicillins, and Other  Review of Systems   Review of Systems All other systems are reviewed and are negative for acute change except as noted in the HPI.  Physical Exam Updated Vital Signs BP 134/80 (BP Location: Right Arm)   Pulse 97   Temp 98.3 F (36.8 C) (Oral)   Resp 20   Ht 5\' 6"  (1.676 m)   Wt 102.1 kg   LMP 08/24/2019   SpO2 98%   BMI 36.32 kg/m   Physical Exam Vitals and nursing note reviewed.  Constitutional:      General: She is not in acute distress.    Appearance: She is not ill-appearing.  HENT:     Head: Normocephalic and atraumatic.     Right Ear: Tympanic membrane and external ear normal.     Left Ear: Tympanic membrane and external ear normal.     Nose: Nose normal.     Mouth/Throat:     Mouth: Mucous membranes are moist.     Pharynx: Oropharynx is clear.  Eyes:     General: No scleral icterus.       Right eye: No discharge.        Left eye: No discharge.      Extraocular Movements: Extraocular movements intact.     Conjunctiva/sclera: Conjunctivae normal.     Pupils: Pupils are equal, round, and reactive to light.  Neck:     Vascular: No JVD.  Cardiovascular:     Rate and Rhythm: Normal rate and regular rhythm.     Pulses: Normal pulses.          Radial  pulses are 2+ on the right side and 2+ on the left side.     Heart sounds: Normal heart sounds. No murmur.  Pulmonary:     Comments: Lungs clear to auscultation in all fields. Symmetric chest rise. No wheezing, rales, or rhonchi. Abdominal:     Comments: Abdomen is soft, non-distended, and non-tender in all quadrants. No rigidity, no guarding. No peritoneal signs.  Musculoskeletal:        General: Normal range of motion.     Cervical back: Normal range of motion.     Right lower leg: No edema.     Left lower leg: No edema.     Comments: Homans sign absent bilaterally, no lower extremity edema, no palpable cords, compartments are soft  Skin:    General: Skin is warm and dry.     Capillary Refill: Capillary refill takes less than 2 seconds.  Neurological:     Mental Status: She is oriented to person, place, and time.     GCS: GCS eye subscore is 4. GCS verbal subscore is 5. GCS motor subscore is 6.     Comments: Fluent speech, no facial droop.  Psychiatric:        Mood and Affect: Mood is anxious.        Behavior: Behavior normal.       ED Results / Procedures / Treatments   Labs (all labs ordered are listed, but only abnormal results are displayed) Labs Reviewed  CBC - Abnormal; Notable for the following components:      Result Value   MCH 24.2 (*)    All other components within normal limits  BASIC METABOLIC PANEL  TROPONIN I (HIGH SENSITIVITY)  TROPONIN I (HIGH SENSITIVITY)    EKG EKG Interpretation  Date/Time:  Tuesday August 24 2019 12:13:18 EST Ventricular Rate:  100 PR Interval:  136 QRS Duration: 86 QT Interval:  374 QTC Calculation: 482 R Axis:   18  Text Interpretation: Normal sinus rhythm Prolonged QT Abnormal ECG Confirmed by Veryl Speak [phone]) on 08/24/2019 12:38:32 PM   Radiology DG Chest 2 View  Result Date: 08/24/2019 CLINICAL DATA:  Left-sided chest pain. EXAM: CHEST - 2 VIEW COMPARISON:  05/03/2015 FINDINGS: The airspace disease seen in the left mid lung previously has resolved although there is some residual atelectasis or scarring in this location today. Right lung clear. The cardiopericardial silhouette is within normal limits for size. The visualized bony structures of the thorax are intact. IMPRESSION: No active cardiopulmonary disease. Electronically Signed   By: Misty Stanley M.D.   On: 08/24/2019 12:53    Procedures Procedures (including critical care time)  Medications Ordered in ED Medications  alum & mag hydroxide-simeth (MAALOX/MYLANTA) 200-200-20 MG/5ML suspension 30 mL (30 mLs Oral Given 08/24/19 1334)  ondansetron (ZOFRAN) injection 4 mg (4 mg Intravenous Given 08/24/19 1335)  ketorolac (TORADOL) 30 MG/ML injection 30 mg (30 mg Intravenous Given 08/24/19 1525)    ED Course  I have reviewed the triage vital signs and the nursing notes.  Pertinent labs & imaging results that were available during my care of the patient were reviewed by me and considered in my medical decision making (see chart for details).    MDM Rules/Calculators/A&P                      Patient presents to the emergency department with chest pain. Patient nontoxic appearing, in no apparent distress, vitals without significant abnormality. Fairly benign physical exam. DDX: ACS,  pulmonary embolism, dissection, pneumothorax, effusion, infiltrate, arrhythmia, anemia, electrolyte derangement, MSK. Evaluation initiated with labs, EKG, and CXR. Patient on cardiac monitor.  Work-up in the ER unremarkable. Labs reviewed, no leukocytosis, anemia, or significant electrolyte abnormality. CXR without infiltrate, effusion, pneumothorax, or  fracture/dislocation.   Heart score of 4, EKG without obvious ischemia, delta troponin negative, doubt ACS. Patient is low risk wells, PERC negative, doubt pulmonary embolism. Pain is not a tearing sensation, symmetric pulses, no widening of mediastinum on CXR, doubt dissection. Cardiac monitor reviewed, no notable arrhythmias or tachycardia. Patient has appeared hemodynamically stable throughout ER visit and appears safe for discharge with close PCP/cardiology follow up. Ambulatory referral sent to cardiology as patient has not been able to establish care with local cardiologist. She is also requesting refill for her gabapentin, she takes for her fibromyalgia. Sent one month prescription to her pharmacy and she is aware if more refills are needed she will need to get from original prescriber.   I discussed results, treatment plan, need for PCP follow-up, and return precautions with the patient. Provided opportunity for questions, patient confirmed understanding and is in agreement with plan.    Portions of this note were generated with Lobbyist. Dictation errors may occur despite best attempts at proofreading.   Final Clinical Impression(s) / ED Diagnoses Final diagnoses:  Atypical chest pain    Rx / DC Orders ED Discharge Orders         Ordered    gabapentin (NEURONTIN) 300 MG capsule  3 times daily     08/24/19 1521    Ambulatory referral to Cardiology     08/24/19 [address], Harley Hallmark, PA-C 08/24/19 1646    Veryl Speak, MD 08/27/19 1507

## 2019-08-26 ENCOUNTER — Other Ambulatory Visit: Payer: Self-pay

## 2019-08-26 ENCOUNTER — Ambulatory Visit (INDEPENDENT_AMBULATORY_CARE_PROVIDER_SITE_OTHER): Payer: 59 | Admitting: Cardiovascular Disease

## 2019-08-26 ENCOUNTER — Encounter: Payer: Self-pay | Admitting: *Deleted

## 2019-08-26 ENCOUNTER — Encounter: Payer: Self-pay | Admitting: Cardiovascular Disease

## 2019-08-26 VITALS — BP 142/88 | HR 101 | Ht 66.0 in | Wt 236.0 lb

## 2019-08-26 DIAGNOSIS — R079 Chest pain, unspecified: Secondary | ICD-10-CM | POA: Diagnosis not present

## 2019-08-26 DIAGNOSIS — R000 Tachycardia, unspecified: Secondary | ICD-10-CM

## 2019-08-26 DIAGNOSIS — R030 Elevated blood-pressure reading, without diagnosis of hypertension: Secondary | ICD-10-CM | POA: Diagnosis not present

## 2019-08-26 DIAGNOSIS — Z01812 Encounter for preprocedural laboratory examination: Secondary | ICD-10-CM

## 2019-08-26 DIAGNOSIS — R0602 Shortness of breath: Secondary | ICD-10-CM

## 2019-08-26 DIAGNOSIS — R0789 Other chest pain: Secondary | ICD-10-CM

## 2019-08-26 MED ORDER — PREDNISONE 50 MG PO TABS: ORAL_TABLET | ORAL | 0 refills | Status: DC

## 2019-08-26 MED ORDER — METOPROLOL TARTRATE 100 MG PO TABS: 100.0000 mg | ORAL_TABLET | Freq: Once | ORAL | 0 refills | Status: DC

## 2019-08-26 MED ORDER — NITROGLYCERIN 0.4 MG SL SUBL: 0.4000 mg | SUBLINGUAL_TABLET | SUBLINGUAL | 3 refills | Status: DC | PRN

## 2019-08-26 MED ORDER — METOPROLOL TARTRATE 25 MG PO TABS: 25.0000 mg | ORAL_TABLET | Freq: Two times a day (BID) | ORAL | 6 refills | Status: DC

## 2019-08-26 NOTE — Progress Notes (Signed)
CARDIOLOGY CONSULT NOTE  Patient ID: Erin Murray MRN: [id number] DOB/AGE: 1970-08-23 49 y.o.  Admit date: (Not on file) Primary Physician: Patient, No Pcp Per  Reason for Consultation: Chest pain  HPI: Erin Murray is a 49 y.o. female who is being seen today for the evaluation of chest pain at the request of Albrizze, Harley Hallmark, PA*.   Past medical history includes TIA and fibromyalgia.  She was evaluated in the ED for chest pain 2 days ago at [facility].  I have personally reviewed all documentation, labs, radiographic and cardiovascular studies, and independently interpreted all ECG's.  She reportedly took an aspirin and one of her husband's nitroglycerin pills and the chest pain resolved.  She then had a recurrence while sitting at her desk the following morning.  It is left side underneath the left breast.  She had accompanying nausea and shortness of breath.  High-sensitivity troponins and basic metabolic panel were normal.  CBC was unremarkable.  Chest x-ray showed no active cardiopulmonary disease.  I personally reviewed the ECG which demonstrated sinus rhythm with no ischemic ST segment or T wave abnormalities, nor any arrhythmias.  She continues to have chest pain but it is not as severe as the day she went to the ED.  She told me that she has a history of a tachycardia going as high as 200 bpm decades ago.  Her blood pressure is usually normal as is her heart rate.  Family history: Father had 6 vessel CABG in his early 61s.  No history of DVT or pulmonary embolism.   Allergies  Allergen Reactions  . Sulfa Antibiotics Nausea And Vomiting and Other (See Comments)    REACTION: Family members "almost died" taking this medication  . Penicillins Hives  . Other Cough    Iv dye     Current Outpatient Medications  Medication Sig Dispense Refill  . aspirin EC 81 MG tablet Take 81 mg by mouth daily.    . cyclobenzaprine (FLEXERIL) 10 MG tablet Take  10 mg by mouth 3 (three) times daily as needed for muscle spasms.    . diazepam (VALIUM) 5 MG tablet Take 5 mg by mouth every 6 (six) hours as needed for anxiety.    . gabapentin (NEURONTIN) 300 MG capsule Take 300 mg by mouth at bedtime.    . Melatonin 10 MG TABS Take 10 mg by mouth at bedtime.    . methocarbamol (ROBAXIN) 500 MG tablet Take 500 mg by mouth every 8 (eight) hours as needed for muscle spasms.    . pantoprazole (PROTONIX) 40 MG tablet Take 40 mg by mouth 2 (two) times daily.    . rizatriptan (MAXALT-MLT) 10 MG disintegrating tablet Take 1 tablet (10 mg total) by mouth as needed for migraine. May repeat in 2 hours if needed 15 tablet 6  . topiramate (TOPAMAX) 50 MG tablet Take 2 tablets (100 mg total) by mouth 2 (two) times daily. One po bid xone week, then 2 tabs po bid (Patient taking differently: Take 100 mg by mouth 2 (two) times daily. ) 120 tablet 12  . vitamin B-12 (CYANOCOBALAMIN) 1000 MCG tablet Take 5,000 mcg by mouth daily.     No current facility-administered medications for this visit.    Past Medical History:  Diagnosis Date  . Complicated migraine    includes: visual changes, left sided weakness, confusion, weakness, dizziness  . Fibromyalgia   . Fibromyalgia   . Headache(784.0)   .  Reflux   . Seizures ([place])   . TIA (transient ischemic attack) 2010    Past Surgical History:  Procedure Laterality Date  . ESOPHAGOGASTRODUODENOSCOPY N/A 10/14/2014   Procedure: ESOPHAGOGASTRODUODENOSCOPY (EGD);  Surgeon: Rogene Houston, MD;  Location: [facility];  Service: Endoscopy;  Laterality: N/A;  210 - moved to 12:45 - Ann to notify pt  . OVARIAN CYST DRAINAGE    . TUBAL LIGATION      Social History   Socioeconomic History  . Marital status: Married    Spouse name: Erin Murray  . Number of children: 3  . Years of education: Not on file  . Highest education level: Not on file  Occupational History  . Occupation: day care  Tobacco Use  . Smoking status: Never  Smoker  . Smokeless tobacco: Never Used  Substance and Sexual Activity  . Alcohol use: No  . Drug use: No  . Sexual activity: Yes    Birth control/protection: Surgical  Other Topics Concern  . Not on file  Social History Narrative   Patient lives at home with her husband Erin Murray), three children. Patient works at at a day care. Patient has three children. 22 years old.   Left handed.   Caffeine two cups of coffee daily   Social Determinants of Health   Financial Resource Strain:   . Difficulty of Paying Living Expenses: Not on file  Food Insecurity:   . Worried About Charity fundraiser in the Last Year: Not on file  . Ran Out of Food in the Last Year: Not on file  Transportation Needs:   . Lack of Transportation (Medical): Not on file  . Lack of Transportation (Non-Medical): Not on file  Physical Activity:   . Days of Exercise per Week: Not on file  . Minutes of Exercise per Session: Not on file  Stress:   . Feeling of Stress : Not on file  Social Connections:   . Frequency of Communication with Friends and Family: Not on file  . Frequency of Social Gatherings with Friends and Family: Not on file  . Attends Religious Services: Not on file  . Active Member of Clubs or Organizations: Not on file  . Attends Archivist Meetings: Not on file  . Marital Status: Not on file  Intimate Partner Violence:   . Fear of Current or Ex-Partner: Not on file  . Emotionally Abused: Not on file  . Physically Abused: Not on file  . Sexually Abused: Not on file      Current Meds  Medication Sig  . aspirin EC 81 MG tablet Take 81 mg by mouth daily.  . cyclobenzaprine (FLEXERIL) 10 MG tablet Take 10 mg by mouth 3 (three) times daily as needed for muscle spasms.  . diazepam (VALIUM) 5 MG tablet Take 5 mg by mouth every 6 (six) hours as needed for anxiety.  . gabapentin (NEURONTIN) 300 MG capsule Take 300 mg by mouth at bedtime.  . Melatonin 10 MG TABS Take 10 mg by mouth at  bedtime.  . methocarbamol (ROBAXIN) 500 MG tablet Take 500 mg by mouth every 8 (eight) hours as needed for muscle spasms.  . pantoprazole (PROTONIX) 40 MG tablet Take 40 mg by mouth 2 (two) times daily.  . rizatriptan (MAXALT-MLT) 10 MG disintegrating tablet Take 1 tablet (10 mg total) by mouth as needed for migraine. May repeat in 2 hours if needed  . topiramate (TOPAMAX) 50 MG tablet Take 2 tablets (100 mg total)  by mouth 2 (two) times daily. One po bid xone week, then 2 tabs po bid (Patient taking differently: Take 100 mg by mouth 2 (two) times daily. )  . vitamin B-12 (CYANOCOBALAMIN) 1000 MCG tablet Take 5,000 mcg by mouth daily.  . [DISCONTINUED] albuterol (PROVENTIL HFA;VENTOLIN HFA) 108 (90 BASE) MCG/ACT inhaler Inhale 2 puffs into the lungs every 6 (six) hours as needed for wheezing or shortness of breath.  . [DISCONTINUED] cyanocobalamin (,VITAMIN B-12,) 1000 MCG/ML injection Inject 1,000 mcg into the muscle every 30 (thirty) days.   . [DISCONTINUED] dicyclomine (BENTYL) 10 MG capsule Take 1 capsule by mouth 15-30 minutes before each meal.  . [DISCONTINUED] gabapentin (NEURONTIN) 300 MG capsule Take 1 capsule (300 mg total) by mouth 3 (three) times daily. (Patient taking differently: Take 300 mg by mouth at bedtime. )  . [DISCONTINUED] HYDROcodone-homatropine (HYCODAN) 5-1.5 MG/5ML syrup Take 5 mLs by mouth every 6 (six) hours as needed.  . [DISCONTINUED] levofloxacin (LEVAQUIN) 500 MG tablet Take 1 tablet (500 mg total) by mouth daily.  . [DISCONTINUED] omeprazole (PRILOSEC) 20 MG capsule Take 1 capsule (20 mg total) by mouth 2 (two) times daily before a meal.  . [DISCONTINUED] predniSONE (DELTASONE) 10 MG tablet 5,4,3,2,1 - take with food      Review of systems complete and found to be negative unless listed above in HPI   [facility] LPN was present throughout the entirety of the encounter.  Physical exam Blood pressure (!) 142/88, pulse (!) 101, height 5\' 6"  (1.676 m), weight 236  lb (107 kg), last menstrual period 08/24/2019, SpO2 98 %. General: NAD Neck: No JVD, no thyromegaly or thyroid nodule.  Lungs: Clear to auscultation bilaterally with normal respiratory effort. CV: Nondisplaced PMI. Regular rate and rhythm, normal S1/S2, no S3/S4, no murmur.  No peripheral edema.  No carotid bruit.    Abdomen: Soft, nontender, no distention.  Skin: Intact without lesions or rashes.  Neurologic: Alert and oriented x 3.  Psych: Normal affect. Extremities: No clubbing or cyanosis.  HEENT: Normal.   ECG: Most recent ECG reviewed.   Labs: Lab Results  Component Value Date/Time   K 4.2 08/24/2019 01:18 PM   BUN 17 08/24/2019 01:18 PM   BUN 15 11/17/2012 09:19 AM   CREATININE 0.74 08/24/2019 01:18 PM   ALT 13 09/17/2014 12:10 AM   TSH 2.750 11/17/2012 09:19 AM   HGB 12.3 08/24/2019 01:18 PM     Lipids: No results found for: LDLCALC, LDLDIRECT, CHOL, TRIG, HDL      ASSESSMENT AND PLAN:   1.  Chest pain and tachycardia: She is tachycardic and hypertensive.  ECG in the ED was unremarkable.  High-sensitivity troponins were normal.  Blood pressure and heart rate are usually normal.  She does have a remote history of tachycardia.  I will check a D-dimer.  I will order a 2-D echocardiogram with Doppler to evaluate cardiac structure, function, and regional wall motion. I will also obtain coronary CT angiography.  I will start metoprolol tartrate 25 mg twice daily to see if this helps to alleviate symptoms as well.  2.  Elevated blood pressure: Blood pressure is usually normal.  I am starting metoprolol tartrate 25 mg twice daily to see if this helps alleviate symptoms.  I will monitor blood pressure.    Disposition: Follow up in 1 month virtual visit  Signed: Kate Sable, M.D., F.A.C.C.  08/26/2019, 3:22 PM

## 2019-08-26 NOTE — Patient Instructions (Addendum)
Medication Instructions:   Lopressor 25mg  twice a day  Nitroglycerin as needed for severe chest pain only.   Continue all other medications.    Labwork:  BMET - please do just prior to CT.  D-Dimer - do as soon as you can.   Lab orders given today.   Testing/Procedures:  Your physician has requested that you have an echocardiogram. Echocardiography is a painless test that uses sound waves to create images of your heart. It provides your doctor with information about the size and shape of your heart and how well your heart's chambers and valves are working. This procedure takes approximately one hour. There are no restrictions for this procedure.  Coronary CT - done at [facility] will contact with results via phone or letter.    Follow-Up: 1 month  Any Other Special Instructions Will Be Listed Below (If Applicable).  If you need a refill on your cardiac medications before your next appointment, please call your pharmacy.

## 2019-08-30 ENCOUNTER — Ambulatory Visit (HOSPITAL_COMMUNITY): Payer: Managed Care, Other (non HMO)

## 2019-09-01 ENCOUNTER — Ambulatory Visit: Payer: Medicaid - Out of State | Admitting: Cardiovascular Disease

## 2019-09-06 ENCOUNTER — Ambulatory Visit (HOSPITAL_COMMUNITY)
Admission: RE | Admit: 2019-09-06 | Discharge: 2019-09-06 | Disposition: A | Discharge status: Home / Self Care | Payer: Managed Care, Other (non HMO) | Source: Ambulatory Visit | Attending: Cardiovascular Disease | Admitting: Cardiovascular Disease

## 2019-09-06 ENCOUNTER — Other Ambulatory Visit: Payer: Self-pay

## 2019-09-06 DIAGNOSIS — R079 Chest pain, unspecified: Secondary | ICD-10-CM | POA: Diagnosis present

## 2019-09-06 DIAGNOSIS — R0602 Shortness of breath: Secondary | ICD-10-CM | POA: Insufficient documentation

## 2019-09-06 NOTE — Progress Notes (Signed)
*  PRELIMINARY RESULTS* Echocardiogram 2D Echocardiogram has been performed.  Erin Murray 09/06/2019, 9:33 AM

## 2019-09-08 ENCOUNTER — Telehealth: Payer: Self-pay | Admitting: *Deleted

## 2019-09-08 NOTE — Telephone Encounter (Signed)
Laurine Blazer, [place]  075-GRM [id number]-GRM AM EST    Left message to return call.

## 2019-09-08 NOTE — Telephone Encounter (Signed)
-----   Message from Herminio Commons, MD sent at 09/06/2019 10:35 AM EST ----- Normal cardiac function.  Good echo overall.

## 2019-09-08 NOTE — Telephone Encounter (Signed)
Returned call- she gave permission to speak with her husband since she cannot answer the phone during they day due to work

## 2019-09-08 NOTE — Telephone Encounter (Signed)
Laurine Blazer, [place]  [id number] [id number] PM EST    Patient notified. Copy to pmd.

## 2019-09-29 ENCOUNTER — Encounter (HOSPITAL_COMMUNITY): Payer: Self-pay

## 2019-09-30 ENCOUNTER — Other Ambulatory Visit: Payer: Self-pay

## 2019-09-30 ENCOUNTER — Encounter (HOSPITAL_COMMUNITY): Payer: Self-pay

## 2019-09-30 ENCOUNTER — Ambulatory Visit (HOSPITAL_COMMUNITY)
Admission: RE | Admit: 2019-09-30 | Discharge: 2019-09-30 | Disposition: A | Discharge status: Home / Self Care | Payer: 59 | Source: Ambulatory Visit | Attending: Cardiovascular Disease | Admitting: Cardiovascular Disease

## 2019-09-30 DIAGNOSIS — R0602 Shortness of breath: Secondary | ICD-10-CM | POA: Insufficient documentation

## 2019-09-30 DIAGNOSIS — Z5309 Procedure and treatment not carried out because of other contraindication: Secondary | ICD-10-CM | POA: Insufficient documentation

## 2019-09-30 DIAGNOSIS — R079 Chest pain, unspecified: Secondary | ICD-10-CM | POA: Diagnosis present

## 2019-09-30 MED ORDER — DILTIAZEM HCL 25 MG/5ML IV SOLN
INTRAVENOUS | Status: AC
  Filled 2019-09-30: qty 5

## 2019-09-30 MED ORDER — DILTIAZEM HCL 25 MG/5ML IV SOLN
5.0000 mg | INTRAVENOUS | Status: DC | PRN
  Administered 2019-09-30: 5 mg via INTRAVENOUS
  Filled 2019-09-30 (×2): qty 5

## 2019-09-30 MED ORDER — NITROGLYCERIN 0.4 MG SL SUBL: 0.8000 mg | SUBLINGUAL_TABLET | Freq: Once | SUBLINGUAL | Status: DC

## 2019-09-30 MED ORDER — METOPROLOL TARTRATE 5 MG/5ML IV SOLN
5.0000 mg | INTRAVENOUS | Status: DC | PRN
  Administered 2019-09-30 (×3): 5 mg via INTRAVENOUS

## 2019-09-30 MED ORDER — METOPROLOL TARTRATE 5 MG/5ML IV SOLN
INTRAVENOUS | Status: AC
  Filled 2019-09-30: qty 15

## 2019-09-30 NOTE — Progress Notes (Signed)
Spoke with Dr. Aundra Dubin in ref to the 5 mg of cardizem given and Pt BP dropping to 107/73 with HR still mid to high 70s. Patient is not feeling well at this time. Dr. Aundra Dubin states to cancel scan at this time. Pt continued to be monitored at this time, Given coffee and snack

## 2019-09-30 NOTE — Progress Notes (Signed)
Spoke with Dr. Aundra Dubin in reference to HR in the mid 70s along with controlled breathing. Patient receieved 15 mg of metoprolol IV. Patient took 3 doses of prednisone PTA due to allergy to IV dye. Will try cardizem 5mg  at this time

## 2019-10-04 ENCOUNTER — Telehealth: Payer: Medicaid - Out of State | Admitting: Cardiovascular Disease

## 2019-10-07 ENCOUNTER — Telehealth: Payer: Self-pay | Admitting: Cardiovascular Disease

## 2019-10-07 ENCOUNTER — Encounter: Payer: Self-pay | Admitting: Cardiovascular Disease

## 2019-10-07 ENCOUNTER — Telehealth (INDEPENDENT_AMBULATORY_CARE_PROVIDER_SITE_OTHER): Payer: 59 | Admitting: Cardiovascular Disease

## 2019-10-07 ENCOUNTER — Encounter: Payer: Self-pay | Admitting: *Deleted

## 2019-10-07 VITALS — BP 144/88 | HR 112 | Ht 66.0 in | Wt 229.0 lb

## 2019-10-07 DIAGNOSIS — R000 Tachycardia, unspecified: Secondary | ICD-10-CM

## 2019-10-07 DIAGNOSIS — I10 Essential (primary) hypertension: Secondary | ICD-10-CM

## 2019-10-07 DIAGNOSIS — R079 Chest pain, unspecified: Secondary | ICD-10-CM

## 2019-10-07 MED ORDER — METOPROLOL TARTRATE 50 MG PO TABS: 50.0000 mg | ORAL_TABLET | Freq: Two times a day (BID) | ORAL | 6 refills | Status: DC

## 2019-10-07 NOTE — Addendum Note (Signed)
Addended by: Laurine Blazer on: 10/07/2019 04:30 PM   Modules accepted: Orders

## 2019-10-07 NOTE — Telephone Encounter (Signed)
Patient called asking that you speak with Marcello [facility])  to schedule anything that Dr Bronson Ing ordered this morning. Stated she will be in meeting and not able to answer her phone  Call [phone]

## 2019-10-07 NOTE — Telephone Encounter (Signed)
Duard Brady (husband) informed that scheduling will be contacting them in regards to setting up the stress test.  After visit summary & instructions for stress test reviewed also.  He verbalized understanding.

## 2019-10-07 NOTE — Progress Notes (Signed)
Virtual Visit via Telephone Note   This visit type was conducted due to national recommendations for restrictions regarding the COVID-19 Pandemic (e.g. social distancing) in an effort to limit this patient's exposure and mitigate transmission in our community.  Due to her co-morbid illnesses, this patient is at least at moderate risk for complications without adequate follow up.  This format is felt to be most appropriate for this patient at this time.  The patient did not have access to video technology/had technical difficulties with video requiring transitioning to audio format only (telephone).  All issues noted in this document were discussed and addressed.  No physical exam could be performed with this format.  Please refer to the patient's chart for her  consent to telehealth for [facility].   The patient was identified using 2 identifiers.  Date:  10/07/2019   ID:  Erin Murray, DOB 1970/08/13, MRN [id number]  Patient Location: Home Provider Location: Office  PCP:  Patient, No Pcp Per  Cardiologist:  Kate Sable, MD  Electrophysiologist:  None   Evaluation Performed:  Follow-Up Visit  Chief Complaint: Chest pain and tachycardia  History of Present Illness:    Erin Murray is a 49 y.o. female with chest pain and tachycardia, previous TIA, and fibromyalgia.  When she went to undergo coronary CT angiography, she was given 5 mg of diltiazem the patient's blood pressure dropped to 107/73 with a heart rate in the mid to high 70 bpm range.  She was not feeling well.  The study was canceled.  Echocardiogram on September 06, 2019 was normal, LVEF 60 to 65%.  Today has been very stressful for her. She describes a sensation of "a bucket of ice melting in my chest" today.  She denies leg swelling. She has some mild, occasional bilateral ankle swelling.  She told me about her child trying to switch therapists accounting for the busyness and stress today.   Family history:  Father had 6 vessel CABG in his early 11s.  No history of DVT or pulmonary embolism.  Soc Hx: Works for [employer] out of her house.  Past Medical History:  Diagnosis Date  . Complicated migraine    includes: visual changes, left sided weakness, confusion, weakness, dizziness  . Fibromyalgia   . Fibromyalgia   . Headache(784.0)   . Reflux   . Seizures ([place])   . TIA (transient ischemic attack) 2010   Past Surgical History:  Procedure Laterality Date  . ESOPHAGOGASTRODUODENOSCOPY N/A 10/14/2014   Procedure: ESOPHAGOGASTRODUODENOSCOPY (EGD);  Surgeon: Rogene Houston, MD;  Location: [facility];  Service: Endoscopy;  Laterality: N/A;  210 - moved to 12:45 - Ann to notify pt  . OVARIAN CYST DRAINAGE    . TUBAL LIGATION       Current Meds  Medication Sig  . aspirin EC 81 MG tablet Take 81 mg by mouth daily.  . cyclobenzaprine (FLEXERIL) 10 MG tablet Take 10 mg by mouth 3 (three) times daily as needed for muscle spasms.  . diclofenac (VOLTAREN) 75 MG EC tablet Take 75 mg by mouth 2 (two) times daily.  Marland Kitchen gabapentin (NEURONTIN) 300 MG capsule Take 300 mg by mouth at bedtime.  . melatonin 5 MG TABS Take 20 mg by mouth at bedtime as needed.  . methocarbamol (ROBAXIN) 750 MG tablet Take 750 mg by mouth 3 (three) times daily.   . metoprolol tartrate (LOPRESSOR) 25 MG tablet Take 1 tablet (25 mg total) by mouth 2 (two)  times daily.  . nitroGLYCERIN (NITROSTAT) 0.4 MG SL tablet Place 1 tablet (0.4 mg total) under the tongue every 5 (five) minutes as needed for chest pain.  . pantoprazole (PROTONIX) 40 MG tablet Take 40 mg by mouth 2 (two) times daily.  . rizatriptan (MAXALT-MLT) 10 MG disintegrating tablet Take 1 tablet (10 mg total) by mouth as needed for migraine. May repeat in 2 hours if needed  . topiramate (TOPAMAX) 50 MG tablet Take 2 tablets (100 mg total) by mouth 2 (two) times daily. One po bid xone week, then 2 tabs po bid (Patient taking differently: Take 100 mg by  mouth 2 (two) times daily. )  . vitamin B-12 (CYANOCOBALAMIN) 1000 MCG tablet Take 5,000 mcg by mouth daily.     Allergies:   Sulfa antibiotics, Penicillins, and Other   Social History   Tobacco Use  . Smoking status: Never Smoker  . Smokeless tobacco: Never Used  Substance Use Topics  . Alcohol use: No  . Drug use: No     Family Hx: The patient's family history includes Asthma in her mother; COPD in her maternal grandfather; Heart disease in her father; High Cholesterol in her father and mother.  ROS:   Please see the history of present illness.     All other systems reviewed and are negative.   Prior CV studies:   The following studies were reviewed today:  Echocardiogram September 06, 2019  1. Left ventricular ejection fraction, by estimation, is 60 to 65%. The  left ventricle has normal function. The left ventricle has no regional  wall motion abnormalities. Left ventricular diastolic parameters were  normal.  2. Right ventricular systolic function is normal. The right ventricular  size is normal.  3. The mitral valve is grossly normal. Mild mitral valve regurgitation.  4. The aortic valve is tricuspid. Aortic valve regurgitation is not  visualized. No aortic stenosis is present.  5. The inferior vena cava is normal in size with greater than 50%  respiratory variability, suggesting right atrial pressure of 3 mmHg.   Labs/Other Tests and Data Reviewed:    EKG:  No ECG reviewed.  Recent Labs: 08/24/2019: BUN 17; Creatinine, Ser 0.74; Hemoglobin 12.3; Platelets 353; Potassium 4.2; Sodium 138   Recent Lipid Panel No results found for: CHOL, TRIG, HDL, CHOLHDL, LDLCALC, LDLDIRECT  Wt Readings from Last 3 Encounters:  10/07/19 229 lb (103.9 kg)  08/26/19 236 lb (107 kg)  08/24/19 225 lb (102.1 kg)     Objective:    Vital Signs:  BP (!) 144/88   Pulse (!) 112   Ht 5\' 6"  (1.676 m)   Wt 229 lb (103.9 kg)   SpO2 97%   BMI 36.96 kg/m    VITAL SIGNS:   reviewed  ASSESSMENT & PLAN:    1.  Chest pain and tachycardia: She remains tachycardic after the institution of metoprolol tartrate 25 mg twice daily.  I will increase to 50 mg twice daily.  Coronary CT angiography was canceled due to reasons mentioned above.  I will obtain a Lexiscan Myoview.  Cardiac function was normal by echocardiogram as detailed above.  2.  Hypertension: BP is elevated today. She is stressed today. This will need further monitoring as I am increasing the dose of Lopressor.     COVID-19 Education: The signs and symptoms of COVID-19 were discussed with the patient and how to seek care for testing (follow up with PCP or arrange E-visit).  The importance of social distancing was  discussed today.  Time:   Today, I have spent 25 minutes with the patient with telehealth technology discussing the above problems.     Medication Adjustments/Labs and Tests Ordered: Current medicines are reviewed at length with the patient today.  Concerns regarding medicines are outlined above.   Tests Ordered: No orders of the defined types were placed in this encounter.   Medication Changes: No orders of the defined types were placed in this encounter.   Follow Up:  Virtual Visit  in 3 month(s)  Signed, Kate Sable, MD  10/07/2019 11:26 AM    [place]

## 2019-10-07 NOTE — Patient Instructions (Addendum)
Medication Instructions:   Increase Lopressor to 50mg  twice a day - new prescription sent to [facility] today.   Continue all other medications.    Labwork: none  Testing/Procedures:  Your physician has requested that you have a lexiscan myoview. For further information please visit [email or web address]. Please follow instruction sheet, as given.  Office will contact with results via phone or letter.    Follow-Up: 3 months - phone   Any Other Special Instructions Will Be Listed Below (If Applicable).  If you need a refill on your cardiac medications before your next appointment, please call your pharmacy.

## 2019-10-11 ENCOUNTER — Telehealth: Payer: Self-pay | Admitting: Cardiovascular Disease

## 2019-10-11 NOTE — Telephone Encounter (Signed)
Pre-cert Verification for the following procedure    LEXISCAN MYOVIEW   DATE: 10/18/2019  [place]

## 2019-10-18 ENCOUNTER — Encounter (HOSPITAL_COMMUNITY): Payer: 59

## 2019-10-18 ENCOUNTER — Other Ambulatory Visit (HOSPITAL_COMMUNITY): Payer: Medicaid - Out of State

## 2019-12-27 ENCOUNTER — Telehealth: Payer: Self-pay | Admitting: *Deleted

## 2019-12-27 NOTE — Telephone Encounter (Signed)
Call placed to patient - scheduled for f/u with SK 01/12/2020.  Was scheduled for Lexi in April as she was not able to have the coronary ct completed.  Lexi was cancelled by the patient.    Left message to return call.

## 2020-01-11 NOTE — Telephone Encounter (Signed)
No return call to present date.  

## 2020-01-12 ENCOUNTER — Telehealth: Payer: Medicaid - Out of State | Admitting: Cardiovascular Disease

## 2020-05-01 ENCOUNTER — Other Ambulatory Visit: Payer: Self-pay | Admitting: Cardiology

## 2020-12-27 DIAGNOSIS — G629 Polyneuropathy, unspecified: Secondary | ICD-10-CM | POA: Insufficient documentation

## 2020-12-27 DIAGNOSIS — G4700 Insomnia, unspecified: Secondary | ICD-10-CM | POA: Insufficient documentation

## 2020-12-27 DIAGNOSIS — K219 Gastro-esophageal reflux disease without esophagitis: Secondary | ICD-10-CM | POA: Insufficient documentation

## 2021-01-23 ENCOUNTER — Other Ambulatory Visit: Payer: Self-pay | Admitting: Student

## 2021-02-25 ENCOUNTER — Other Ambulatory Visit: Payer: Self-pay | Admitting: Family Medicine

## 2021-06-04 ENCOUNTER — Other Ambulatory Visit: Payer: Self-pay | Admitting: Family

## 2021-07-13 ENCOUNTER — Other Ambulatory Visit (HOSPITAL_COMMUNITY): Payer: Self-pay | Admitting: Family

## 2021-07-13 DIAGNOSIS — R198 Other specified symptoms and signs involving the digestive system and abdomen: Secondary | ICD-10-CM

## 2021-08-15 ENCOUNTER — Encounter (HOSPITAL_COMMUNITY): Payer: Self-pay | Admitting: Emergency Medicine

## 2021-08-15 ENCOUNTER — Emergency Department (HOSPITAL_COMMUNITY)
Admission: EM | Admit: 2021-08-15 | Discharge: 2021-08-15 | Disposition: A | Discharge status: Home / Self Care | Payer: 59 | Attending: Emergency Medicine | Admitting: Emergency Medicine

## 2021-08-15 ENCOUNTER — Other Ambulatory Visit: Payer: Self-pay

## 2021-08-15 ENCOUNTER — Emergency Department (HOSPITAL_COMMUNITY): Payer: 59

## 2021-08-15 DIAGNOSIS — S0990XA Unspecified injury of head, initial encounter: Secondary | ICD-10-CM | POA: Diagnosis not present

## 2021-08-15 DIAGNOSIS — R109 Unspecified abdominal pain: Secondary | ICD-10-CM | POA: Insufficient documentation

## 2021-08-15 DIAGNOSIS — Z79899 Other long term (current) drug therapy: Secondary | ICD-10-CM | POA: Insufficient documentation

## 2021-08-15 DIAGNOSIS — R0789 Other chest pain: Secondary | ICD-10-CM | POA: Diagnosis not present

## 2021-08-15 DIAGNOSIS — S63502A Unspecified sprain of left wrist, initial encounter: Secondary | ICD-10-CM | POA: Insufficient documentation

## 2021-08-15 DIAGNOSIS — T1490XA Injury, unspecified, initial encounter: Secondary | ICD-10-CM

## 2021-08-15 DIAGNOSIS — Z7982 Long term (current) use of aspirin: Secondary | ICD-10-CM | POA: Insufficient documentation

## 2021-08-15 DIAGNOSIS — S6992XA Unspecified injury of left wrist, hand and finger(s), initial encounter: Secondary | ICD-10-CM | POA: Diagnosis present

## 2021-08-15 LAB — COMPREHENSIVE METABOLIC PANEL
ALT: 21 U/L (ref 0–44)
AST: 18 U/L (ref 15–41)
Albumin: 4.2 g/dL (ref 3.5–5.0)
Alkaline Phosphatase: 98 U/L (ref 38–126)
Anion gap: 4 — ABNORMAL LOW (ref 5–15)
BUN: 25 mg/dL — ABNORMAL HIGH (ref 6–20)
CO2: 25 mmol/L (ref 22–32)
Calcium: 9.1 mg/dL (ref 8.9–10.3)
Chloride: 108 mmol/L (ref 98–111)
Creatinine, Ser: 0.84 mg/dL (ref 0.44–1.00)
GFR, Estimated: >60 mL/min (ref >60)
Glucose, Bld: 110 mg/dL — ABNORMAL HIGH (ref 70–99)
Potassium: 3.3 mmol/L — ABNORMAL LOW (ref 3.5–5.1)
Sodium: 137 mmol/L (ref 135–145)
Total Bilirubin: 0.4 mg/dL (ref 0.3–1.2)
Total Protein: 7.5 g/dL (ref 6.5–8.1)

## 2021-08-15 LAB — I-STAT BETA HCG BLOOD, ED (MC, WL, AP ONLY): I-stat hCG, quantitative: 15.8 m[IU]/mL — ABNORMAL HIGH (ref <5)

## 2021-08-15 LAB — CBC WITH DIFFERENTIAL/PLATELET
Abs Immature Granulocytes: 0.02 10*3/uL (ref 0.00–0.07)
Basophils Absolute: 0.1 10*3/uL (ref 0.0–0.1)
Basophils Relative: 1 %
Eosinophils Absolute: 0.2 10*3/uL (ref 0.0–0.5)
Eosinophils Relative: 2 %
HCT: 41.4 % (ref 36.0–46.0)
Hemoglobin: 13.1 g/dL (ref 12.0–15.0)
Immature Granulocytes: 0 %
Lymphocytes Relative: 40 %
Lymphs Abs: 3.2 10*3/uL (ref 0.7–4.0)
MCH: 26.3 pg (ref 26.0–34.0)
MCHC: 31.6 g/dL (ref 30.0–36.0)
MCV: 83.1 fL (ref 80.0–100.0)
Monocytes Absolute: 0.6 10*3/uL (ref 0.1–1.0)
Monocytes Relative: 8 %
Neutro Abs: 4 10*3/uL (ref 1.7–7.7)
Neutrophils Relative %: 49 %
Platelets: 289 10*3/uL (ref 150–400)
RBC: 4.98 MIL/uL (ref 3.87–5.11)
RDW: 13.5 % (ref 11.5–15.5)
WBC: 8.1 10*3/uL (ref 4.0–10.5)
nRBC: 0 % (ref 0.0–0.2)

## 2021-08-15 LAB — I-STAT CHEM 8, ED
BUN: 24 mg/dL — ABNORMAL HIGH (ref 6–20)
Calcium, Ion: 1.26 mmol/L (ref 1.15–1.40)
Chloride: 105 mmol/L (ref 98–111)
Creatinine, Ser: 0.9 mg/dL (ref 0.44–1.00)
Glucose, Bld: 108 mg/dL — ABNORMAL HIGH (ref 70–99)
HCT: 42 % (ref 36.0–46.0)
Hemoglobin: 14.3 g/dL (ref 12.0–15.0)
Potassium: 3.3 mmol/L — ABNORMAL LOW (ref 3.5–5.1)
Sodium: 142 mmol/L (ref 135–145)
TCO2: 25 mmol/L (ref 22–32)

## 2021-08-15 LAB — HCG, QUANTITATIVE, PREGNANCY: hCG, Beta Chain, Quant, S: 5 m[IU]/mL — ABNORMAL HIGH (ref <5)

## 2021-08-15 MED ORDER — OXYCODONE-ACETAMINOPHEN 5-325 MG PO TABS
1.0000 | ORAL_TABLET | Freq: Once | ORAL | Status: AC
  Administered 2021-08-15: 1 via ORAL
  Filled 2021-08-15: qty 1

## 2021-08-15 MED ORDER — HYDROCODONE-ACETAMINOPHEN 5-325 MG PO TABS: 1.0000 | ORAL_TABLET | ORAL | 0 refills | Status: DC | PRN

## 2021-08-15 MED ORDER — IOHEXOL 300 MG/ML  SOLN
100.0000 mL | Freq: Once | INTRAMUSCULAR | Status: AC | PRN
  Administered 2021-08-15: 100 mL via INTRAVENOUS
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
## 2021-09-04 ENCOUNTER — Emergency Department (HOSPITAL_COMMUNITY): Payer: 59

## 2021-09-04 ENCOUNTER — Encounter (HOSPITAL_COMMUNITY): Payer: Self-pay | Admitting: *Deleted

## 2021-09-04 ENCOUNTER — Emergency Department (HOSPITAL_COMMUNITY)
Admission: EM | Admit: 2021-09-04 | Discharge: 2021-09-04 | Disposition: A | Discharge status: Home / Self Care | Payer: 59 | Attending: Emergency Medicine | Admitting: Emergency Medicine

## 2021-09-04 ENCOUNTER — Other Ambulatory Visit: Payer: Self-pay

## 2021-09-04 DIAGNOSIS — W19XXXA Unspecified fall, initial encounter: Secondary | ICD-10-CM | POA: Diagnosis not present

## 2021-09-04 DIAGNOSIS — M79642 Pain in left hand: Secondary | ICD-10-CM | POA: Diagnosis not present

## 2021-09-04 DIAGNOSIS — M25532 Pain in left wrist: Secondary | ICD-10-CM | POA: Insufficient documentation

## 2021-09-04 DIAGNOSIS — Z7982 Long term (current) use of aspirin: Secondary | ICD-10-CM | POA: Insufficient documentation

## 2021-09-04 MED ORDER — OXYCODONE-ACETAMINOPHEN 5-325 MG PO TABS
1.0000 | ORAL_TABLET | Freq: Once | ORAL | Status: AC
  Administered 2021-09-04: 1 via ORAL
  Filled 2021-09-04: qty 1

## 2021-09-04 MED ORDER — OXYCODONE-ACETAMINOPHEN 5-325 MG PO TABS: 1.0000 | ORAL_TABLET | Freq: Four times a day (QID) | ORAL | 0 refills | Status: DC | PRN

## 2021-09-04 NOTE — Discharge Instructions (Signed)
Please follow up with Dr. Aline Brochure for further evaluation of your wrist sprain/re injury  While at home please rest, ice, and elevate your wrist/hand to help reduce pain/inflammation.   I have provided a very short course of pain medication to take as needed. It is recommended that you take 800 mg Ibuprofen every 8 hours as needed for pain and to take this only for break through pain  Return to the ED for any new/worsening symptoms

## 2021-09-04 NOTE — ED Triage Notes (Signed)
Left wrist pain, states she was seen for same on 08/15/21. States it is not better and fell 2 nights ago and re injured wrist
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
## 2021-09-13 ENCOUNTER — Other Ambulatory Visit: Payer: Self-pay

## 2021-09-13 ENCOUNTER — Encounter: Payer: Self-pay | Admitting: Orthopedic Surgery

## 2021-09-13 ENCOUNTER — Ambulatory Visit (INDEPENDENT_AMBULATORY_CARE_PROVIDER_SITE_OTHER): Payer: 59 | Admitting: Orthopedic Surgery

## 2021-09-13 VITALS — BP 145/95 | HR 99 | Ht 66.0 in | Wt 219.8 lb

## 2021-09-13 DIAGNOSIS — S6992XA Unspecified injury of left wrist, hand and finger(s), initial encounter: Secondary | ICD-10-CM

## 2021-09-13 DIAGNOSIS — S63502A Unspecified sprain of left wrist, initial encounter: Secondary | ICD-10-CM | POA: Diagnosis not present
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
## 2021-09-13 NOTE — Patient Instructions (Addendum)
Note ?Work do not change diapers or lift over 5 lbs, work in brace x 4 weeks  ? ?OT therapy referral placed. 3x weekly for 4 weeks. ? ?Occupational therapy has been ordered for you at [facility]. They should call you to schedule, [phone] is the phone number to call, if you want to call to schedule.   ?

## 2021-09-18 ENCOUNTER — Other Ambulatory Visit: Payer: Self-pay

## 2021-09-18 ENCOUNTER — Encounter (HOSPITAL_COMMUNITY): Payer: Self-pay | Admitting: Occupational Therapy

## 2021-09-18 ENCOUNTER — Ambulatory Visit (HOSPITAL_COMMUNITY): Payer: 59 | Attending: Orthopedic Surgery | Admitting: Occupational Therapy

## 2021-09-18 DIAGNOSIS — M25642 Stiffness of left hand, not elsewhere classified: Secondary | ICD-10-CM | POA: Diagnosis present

## 2021-09-18 DIAGNOSIS — M25632 Stiffness of left wrist, not elsewhere classified: Secondary | ICD-10-CM

## 2021-09-18 DIAGNOSIS — M25532 Pain in left wrist: Secondary | ICD-10-CM | POA: Diagnosis present

## 2021-09-18 DIAGNOSIS — R29898 Other symptoms and signs involving the musculoskeletal system: Secondary | ICD-10-CM

## 2021-09-18 DIAGNOSIS — M79642 Pain in left hand: Secondary | ICD-10-CM | POA: Diagnosis present

## 2021-09-18 NOTE — Patient Instructions (Signed)
Complete each exercise 10-15X, 2-3X/day  1) Towel crunch Place a small towel on a firm table top. Flatten out the towel and then place your hand on one end of it.  Next, flex your fingers 2-5 (index finger through pinky finger) as you pull the towel towards your hand.    2) Digit composite flexion/adduction (make a fist) Hold your hand up as shown. Open and close your hand into a fist and repeat. If you cannot make a full fist, then make a partial fist.    3) Thumb/finger opposition Touch the tip of the thumb to each fingertip one by one. Extend fingers fully after they are touched.      4) Finger Taps Start with the hand flat and fingers slightly spread.  One at a time, starting with the thumb, lift each finger up separately.     5) Digit Abduction/Adduction Hold hand palm down flat on table. Spread your fingers apart and back together.   
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
## 2021-09-26 ENCOUNTER — Other Ambulatory Visit: Payer: Self-pay

## 2021-09-26 ENCOUNTER — Encounter (HOSPITAL_COMMUNITY): Payer: Self-pay | Admitting: Occupational Therapy

## 2021-09-26 ENCOUNTER — Ambulatory Visit (HOSPITAL_COMMUNITY): Payer: 59 | Admitting: Occupational Therapy

## 2021-09-26 DIAGNOSIS — R29898 Other symptoms and signs involving the musculoskeletal system: Secondary | ICD-10-CM

## 2021-09-26 DIAGNOSIS — M79642 Pain in left hand: Secondary | ICD-10-CM

## 2021-09-26 DIAGNOSIS — M25632 Stiffness of left wrist, not elsewhere classified: Secondary | ICD-10-CM

## 2021-09-26 DIAGNOSIS — M25532 Pain in left wrist: Secondary | ICD-10-CM

## 2021-09-26 DIAGNOSIS — M25642 Stiffness of left hand, not elsewhere classified: Secondary | ICD-10-CM

## 2021-09-26 NOTE — Patient Instructions (Signed)
Complete each exercise 10X, 2X/day ? ?1) Towel crunch ?Place a small towel on a firm table top. Flatten out the towel and then place your hand on one end of it.  Next, flex your fingers 2-5 (index finger through pinky finger) as you pull the towel towards your hand. ? ? ? ?2) Digit composite flexion/adduction (make a fist) ?Hold your hand up as shown. Open and close your hand into a fist and repeat. If you cannot make a full fist, then make a partial fist. ? ? ? ?3) Thumb/finger opposition ?Touch the tip of the thumb to each fingertip one by one. Extend fingers fully after they are touched.  ? ? ? ? ?4) Finger Taps ?Start with the hand flat and fingers slightly spread.  One at a time, starting with the thumb, lift each finger up separately. ? ? ? ? ? ?5) Digit Abduction/Adduction ?Hold hand palm down flat on table. Spread your fingers apart and back together. ? ? ? ? ? ?Wrist AROM Exercises  ? ?*Complete exercises ___10___ times each, ___2____ times per day* ? ?1) Wrist Flexion ? ?Start with wrist at edge of table, palm facing up. With wrist hanging slightly off table, curl wrist upward, and back down. ? ? ? ? ? ?2) Wrist Extension ? ?Start with wrist at edge of table, palm facing down. With wrist slightly off the edge of the table, curl wrist up and back down. ? ? ? ? ? ?3) Radial Deviations ? ?Start with forearm flat against a table, wrist hanging slightly off the edge, and palm facing the wall. Bending at the wrist only, and keeping palm facing the wall, bend wrist so fist is pointing towards the floor, back up to start position, and up towards the ceiling. Return to start. ? ? ? ? ? ? ? ?4) WRIST PRONATION ? ?Turn your forearm towards palm face down. ? ?Keep your elbow bent and by the side of your  Body. ? ? ? ? ? ?5) WRIST SUPINATION ? ?Turn your forearm towards palm face up. ? ?Keep your elbow bent and by the side of your  Body. ? ? ? ? ? ? ? ? ? ? ? ? ? ?
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
## 2021-10-01 ENCOUNTER — Encounter (HOSPITAL_COMMUNITY): Payer: Self-pay

## 2021-10-01 ENCOUNTER — Other Ambulatory Visit: Payer: Self-pay

## 2021-10-01 ENCOUNTER — Emergency Department (HOSPITAL_COMMUNITY)
Admission: EM | Admit: 2021-10-01 | Discharge: 2021-10-01 | Disposition: A | Discharge status: Home / Self Care | Payer: 59 | Attending: Emergency Medicine | Admitting: Emergency Medicine

## 2021-10-01 DIAGNOSIS — Z7982 Long term (current) use of aspirin: Secondary | ICD-10-CM | POA: Insufficient documentation

## 2021-10-01 DIAGNOSIS — R040 Epistaxis: Secondary | ICD-10-CM | POA: Insufficient documentation

## 2021-10-01 DIAGNOSIS — Z79899 Other long term (current) drug therapy: Secondary | ICD-10-CM | POA: Insufficient documentation

## 2021-10-01 MED ORDER — SILVER NITRATE-POT NITRATE 75-25 % EX MISC
1.0000 "application " | Freq: Once | CUTANEOUS | Status: AC
  Administered 2021-10-01: 1 via TOPICAL
  Filled 2021-10-01: qty 10

## 2021-10-01 MED ORDER — OXYMETAZOLINE HCL 0.05 % NA SOLN
1.0000 | Freq: Once | NASAL | Status: AC
  Administered 2021-10-01: 1 via NASAL
  Filled 2021-10-01: qty 30

## 2021-10-01 NOTE — ED Provider Notes (Signed)
?[facility] ?Provider Note ? ? ?CSN: [id number] ?Arrival date & time: 10/01/21  2023 ? ?  ? ?History ? ?No chief complaint on file. ? ? ?Erin Murray is a 51 y.o. female. ? ?Pt is a 51 yo female with a hx of gerd, fibromyalgia, seizures, and TIA.  Pt started getting nose bleeds yesterday intermittently.  Today, she could not get her nosebleed to stop, so she came in.  She is not on blood thinners.  She is only on asa. ? ? ?  ? ?Home Medications ?Prior to Admission medications   ?Medication Sig Start Date End Date Taking? Authorizing Provider  ?aspirin EC 81 MG tablet Take 81 mg by mouth daily.    [provider]  ?cyclobenzaprine (FLEXERIL) 10 MG tablet Take 10 mg by mouth 3 (three) times daily as needed for muscle spasms.    [provider]  ?diclofenac (VOLTAREN) 75 MG EC tablet Take 75 mg by mouth 2 (two) times daily. 08/30/19   [provider]  ?gabapentin (NEURONTIN) 300 MG capsule Take 300 mg by mouth at bedtime.    [provider]  ?HYDROcodone-acetaminophen (NORCO/VICODIN) 5-325 MG tablet Take 1 tablet by mouth every 4 (four) hours as needed for moderate pain. 08/15/21   Orpah Greek, MD  ?melatonin 5 MG TABS Take 20 mg by mouth at bedtime as needed.    [provider]  ?methocarbamol (ROBAXIN) 750 MG tablet Take 750 mg by mouth 3 (three) times daily.     [provider]  ?metoprolol tartrate (LOPRESSOR) 50 MG tablet TAKE 1 TABLET BY MOUTH TWICE DAILY 02/26/21   Verta Ellen., NP  ?nitroGLYCERIN (NITROSTAT) 0.4 MG SL tablet Place 1 tablet (0.4 mg total) under the tongue every 5 (five) minutes as needed for chest pain. 08/26/19 11/24/19  Herminio Commons, MD  ?oxyCODONE-acetaminophen (PERCOCET/ROXICET) 5-325 MG tablet Take 1 tablet by mouth every 6 (six) hours as needed for severe pain. 09/04/21   Eustaquio Maize, PA-C  ?pantoprazole (PROTONIX) 40 MG tablet Take 40 mg by mouth 2 (two) times daily.    [provider]  ?rizatriptan (MAXALT-MLT) 10 MG disintegrating tablet Take 1 tablet (10 mg total) by mouth as needed for migraine. May repeat in 2 hours if needed 12/30/12   Marcial Pacas, MD  ?topiramate (TOPAMAX) 50 MG tablet Take 2 tablets (100 mg total) by mouth 2 (two) times daily. One po bid xone week, then 2 tabs po bid ?Patient taking differently: Take 100 mg by mouth 2 (two) times daily. 12/30/12   Marcial Pacas, MD  ?vitamin B-12 (CYANOCOBALAMIN) 1000 MCG tablet Take 5,000 mcg by mouth daily.    [provider]  ?   ? ?Allergies    ?Sulfa antibiotics and Penicillins   ? ?Review of Systems   ?Review of Systems  ?HENT:  Positive for nosebleeds.   ?All other systems reviewed and are negative. ? ?Physical Exam ?Updated Vital Signs ?BP (!) 134/93   Pulse 98   Temp 97.8 ?F (36.6 ?C)   Resp 19   Ht '5\' 6"'$  (1.676 m)   Wt 100.7 kg   LMP 08/24/2019   SpO2 100%   BMI 35.83 kg/m?  ?Physical Exam ?Vitals and nursing note reviewed.  ?Constitutional:   ?   Appearance: Normal appearance.  ?HENT:  ?   Head: Normocephalic and atraumatic.  ?   Right Ear: External ear normal.  ?   Left Ear: External ear normal.  ?  Nose:  ?   Right Nostril: Epistaxis present.  ?   Left Nostril: Epistaxis present.  ?Eyes:  ?   Extraocular Movements: Extraocular movements intact.  ?   Conjunctiva/sclera: Conjunctivae normal.  ?   Pupils: Pupils are equal, round, and reactive to light.  ?Cardiovascular:  ?   Rate and Rhythm: Normal rate and regular rhythm.  ?   Pulses: Normal pulses.  ?   Heart sounds: Normal heart sounds.  ?Pulmonary:  ?   Effort: Pulmonary effort is normal.  ?   Breath sounds: Normal breath sounds.  ?Abdominal:  ?   General: Abdomen is flat. Bowel sounds are normal.  ?   Palpations: Abdomen is soft.  ?Musculoskeletal:     ?   General: Normal range of motion.  ?   Cervical back: Normal range of motion and neck supple.  ?Skin: ?   General: Skin is warm.  ?   Capillary Refill: Capillary refill takes less than 2 seconds.   ?Neurological:  ?   General: No focal deficit present.  ?   Mental Status: She is alert and oriented to person, place, and time.  ?Psychiatric:     ?   Mood and Affect: Mood normal.     ?   Behavior: Behavior normal.  ? ? ?ED Results / Procedures / Treatments   ?Labs ?(all labs ordered are listed, but only abnormal results are displayed) ?Labs Reviewed - No data to display ? ?EKG ?None ? ?Radiology ?No results found. ? ?Procedures ?.Epistaxis Management ? ?Date/Time: 10/01/2021 11:43 PM ?Performed by: Isla Pence, MD ?Authorized by: Isla Pence, MD  ? ?Consent:  ?  Consent obtained:  Verbal ?  Consent given by:  Patient ?  Risks, benefits, and alternatives were discussed: yes   ?  Risks discussed:  Bleeding and pain ?  Alternatives discussed:  No treatment ?Universal protocol:  ?  Patient identity confirmed:  Verbally with patient ?Procedure details:  ?  Treatment site:  L anterior and R anterior ?  Treatment method:  Silver nitrate ?  Treatment complexity:  Limited ?  Treatment episode: initial   ?Post-procedure details:  ?  Assessment:  Bleeding stopped ?  Procedure completion:  Tolerated well, no immediate complications  ? ? ?Medications Ordered in ED ?Medications  ?oxymetazoline (AFRIN) 0.05 % nasal spray 1 spray (1 spray Each Nare Given 10/01/21 2150)  ?silver nitrate applicators applicator 1 application. (1 application. Topical Given 10/01/21 2339)  ? ? ?ED Course/ Medical Decision Making/ A&P ?  ?                        ?Medical Decision Making ?Risk ?OTC drugs. ?Prescription drug management. ? ? ?This patient presents to the ED for concern of nosebleed, this involves an extensive number of treatment options, and is a complaint that carries with it a high risk of complications and morbidity.  The differential diagnosis includes anterior/posterior bleed ? ? ?Co morbidities that complicate the patient evaluation ? ?TIA on asa ? ? ?Additional history obtained: ? ?Additional history obtained from epic chart  review ? ? ? ?Problem List / ED Course: ? ?Epistaxis:  pt's nose bleed has stopped after afrin, nasal clamping, and silver nitrate ? ? ?Reevaluation: ? ?After the interventions noted above, I reevaluated the patient and found that they have :improved ? ? ?Social Determinants of Health: ? ?Lives at home ? ? ?Dispostion: ? ?After consideration of the diagnostic results and the patients  response to treatment, I feel that the patent would benefit from discharge with outpatient f/u.   ? ? ? ? ? ? ? ?Final Clinical Impression(s) / ED Diagnoses ?Final diagnoses:  ?Epistaxis  ? ? ?Rx / DC Orders ?ED Discharge Orders   ? ? None  ? ?  ? ? ?  ?Isla Pence, MD ?10/01/21 2347 ? ?

## 2021-10-01 NOTE — ED Notes (Signed)
Pt's paper towel removed. Large clot from right nare fell out. Nose dripping blood. Afrin used. Bleeding currently stopped.  ?

## 2021-10-01 NOTE — ED Notes (Signed)
Nose currently not bleeding. Rec'd d/c papers. All questions answered. Ambulatory to lobby. Denies any pain.  ?

## 2021-10-01 NOTE — ED Notes (Signed)
Nitrate stick at bedside for EDP  ?

## 2021-10-01 NOTE — ED Triage Notes (Signed)
Ambulatory to ED with c/o nose bleed. States this is the 6th nose bleed since yesterday. Bleeding slowing down now, but states it was "gushing" earlier.  ?

## 2021-10-03 ENCOUNTER — Other Ambulatory Visit: Payer: Self-pay

## 2021-10-03 ENCOUNTER — Ambulatory Visit (HOSPITAL_COMMUNITY): Payer: 59 | Admitting: Occupational Therapy

## 2021-10-03 ENCOUNTER — Encounter (HOSPITAL_COMMUNITY): Payer: Self-pay | Admitting: Occupational Therapy

## 2021-10-03 DIAGNOSIS — M25532 Pain in left wrist: Secondary | ICD-10-CM

## 2021-10-03 DIAGNOSIS — R29898 Other symptoms and signs involving the musculoskeletal system: Secondary | ICD-10-CM

## 2021-10-03 DIAGNOSIS — M79642 Pain in left hand: Secondary | ICD-10-CM

## 2021-10-03 DIAGNOSIS — M25632 Stiffness of left wrist, not elsewhere classified: Secondary | ICD-10-CM

## 2021-10-03 DIAGNOSIS — M25642 Stiffness of left hand, not elsewhere classified: Secondary | ICD-10-CM
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
## 2021-10-08 ENCOUNTER — Encounter (HOSPITAL_COMMUNITY): Payer: Self-pay

## 2021-10-08 ENCOUNTER — Encounter (HOSPITAL_COMMUNITY): Payer: 59

## 2021-10-08 ENCOUNTER — Telehealth (HOSPITAL_COMMUNITY): Payer: Self-pay

## 2021-10-08 NOTE — Telephone Encounter (Signed)
Attempted to call patient regarding no show for today's OT appointment. Unable to leave a voicemail as the voicemail has not been set up for the number on file. I will also send patient a message via [place].  ? ?Ailene Ravel, OTR/L,CBIS  ?[phone] ? ?

## 2021-10-11 ENCOUNTER — Encounter: Payer: Self-pay | Admitting: Orthopedic Surgery

## 2021-10-11 ENCOUNTER — Ambulatory Visit (INDEPENDENT_AMBULATORY_CARE_PROVIDER_SITE_OTHER): Payer: 59 | Admitting: Orthopedic Surgery

## 2021-10-11 DIAGNOSIS — S6992XD Unspecified injury of left wrist, hand and finger(s), subsequent encounter: Secondary | ICD-10-CM | POA: Diagnosis not present

## 2021-10-11 DIAGNOSIS — S63502D Unspecified sprain of left wrist, subsequent encounter: Secondary | ICD-10-CM

## 2021-10-11 NOTE — Patient Instructions (Signed)
Extend the previous work restriction note for 6 weeks ?
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
## 2021-10-15 ENCOUNTER — Encounter (HOSPITAL_COMMUNITY): Payer: Self-pay

## 2021-10-15 ENCOUNTER — Ambulatory Visit (HOSPITAL_COMMUNITY): Payer: 59 | Attending: Orthopedic Surgery

## 2021-10-15 DIAGNOSIS — M25532 Pain in left wrist: Secondary | ICD-10-CM | POA: Insufficient documentation

## 2021-10-15 DIAGNOSIS — M25632 Stiffness of left wrist, not elsewhere classified: Secondary | ICD-10-CM | POA: Insufficient documentation

## 2021-10-15 DIAGNOSIS — M79642 Pain in left hand: Secondary | ICD-10-CM | POA: Insufficient documentation

## 2021-10-15 DIAGNOSIS — R29898 Other symptoms and signs involving the musculoskeletal system: Secondary | ICD-10-CM | POA: Diagnosis present

## 2021-10-15 DIAGNOSIS — M25642 Stiffness of left hand, not elsewhere classified: Secondary | ICD-10-CM | POA: Insufficient documentation
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
## 2021-10-24 ENCOUNTER — Ambulatory Visit (HOSPITAL_COMMUNITY): Payer: 59

## 2021-10-24 ENCOUNTER — Encounter (HOSPITAL_COMMUNITY): Payer: Self-pay

## 2021-10-24 DIAGNOSIS — M25532 Pain in left wrist: Secondary | ICD-10-CM

## 2021-10-24 DIAGNOSIS — M25642 Stiffness of left hand, not elsewhere classified: Secondary | ICD-10-CM

## 2021-10-24 DIAGNOSIS — R29898 Other symptoms and signs involving the musculoskeletal system: Secondary | ICD-10-CM

## 2021-10-24 DIAGNOSIS — M79642 Pain in left hand: Secondary | ICD-10-CM

## 2021-10-24 DIAGNOSIS — M25632 Stiffness of left wrist, not elsewhere classified: Secondary | ICD-10-CM

## 2021-10-24 NOTE — Patient Instructions (Signed)
Theraputty Home Exercise Program ? ?Complete 1-2 times a day. ? ?putty squeeze ? ?Pt. should squeeze putty in hand trying to keep it round by rotating putty after each squeeze. push fingers through putty to palm each time. Complete for ___1___ minutes. ? ? ?PUTTY KEY GRIP ? ?Hold the putty at the top of your hand. Squeeze the putty between your thumb and the side of your 2nd finger as shown. Complete for ____1____ minutes. ? ? ? ?PUTTY 3 JAW CHUCK ? ?Roll up some putty into a ball then flatten it. Then, firmly squeeze it with your first 3 fingers as shown. Complete for ___1___ minutes. ? ? ? ? ? ? ? ? ? ?
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
## 2021-10-31 ENCOUNTER — Encounter (HOSPITAL_COMMUNITY): Payer: Self-pay

## 2021-10-31 ENCOUNTER — Ambulatory Visit (HOSPITAL_COMMUNITY): Payer: 59

## 2021-10-31 DIAGNOSIS — M25532 Pain in left wrist: Secondary | ICD-10-CM

## 2021-10-31 DIAGNOSIS — R29898 Other symptoms and signs involving the musculoskeletal system: Secondary | ICD-10-CM

## 2021-10-31 DIAGNOSIS — M79642 Pain in left hand: Secondary | ICD-10-CM
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
## 2021-11-07 ENCOUNTER — Encounter (HOSPITAL_COMMUNITY): Payer: 59

## 2021-11-15 ENCOUNTER — Ambulatory Visit (HOSPITAL_COMMUNITY): Payer: 59 | Attending: Orthopedic Surgery | Admitting: Occupational Therapy

## 2021-11-15 ENCOUNTER — Encounter (HOSPITAL_COMMUNITY): Payer: Self-pay | Admitting: Occupational Therapy

## 2021-11-15 DIAGNOSIS — M25532 Pain in left wrist: Secondary | ICD-10-CM | POA: Insufficient documentation

## 2021-11-15 DIAGNOSIS — R29898 Other symptoms and signs involving the musculoskeletal system: Secondary | ICD-10-CM | POA: Insufficient documentation

## 2021-11-15 DIAGNOSIS — M79642 Pain in left hand: Secondary | ICD-10-CM | POA: Diagnosis present

## 2021-11-15 DIAGNOSIS — M25632 Stiffness of left wrist, not elsewhere classified: Secondary | ICD-10-CM | POA: Diagnosis present

## 2021-11-15 DIAGNOSIS — M25642 Stiffness of left hand, not elsewhere classified: Secondary | ICD-10-CM | POA: Diagnosis present

## 2021-11-15 NOTE — Patient Instructions (Signed)
AROM Exercises  ? ?*Complete exercises ___10___ times each, ____2___ times per day* ? ?1) Wrist Flexion ? ?Start with wrist at edge of table, palm facing up. With wrist hanging slightly off table, curl wrist upward, and back down. ? ? ? ? ? ?2) Wrist Extension ? ?Start with wrist at edge of table, palm facing down. With wrist slightly off the edge of the table, curl wrist up and back down. ? ? ? ? ? ?3) Radial Deviations ? ?Start with forearm flat against a table, wrist hanging slightly off the edge, and palm facing the wall. Bending at the wrist only, and keeping palm facing the wall, bend wrist so fist is pointing towards the floor, back up to start position, and up towards the ceiling. Return to start. ? ? ? ? ? ? ? ?4) WRIST PRONATION ? ?Turn your forearm towards palm face down. ? ?Keep your elbow bent and by the side of your  Body. ? ? ? ? ? ?5) WRIST SUPINATION ? ?Turn your forearm towards palm face up. ? ?Keep your elbow bent and by the side of your  Body. ? ? ? ? ? ?Hand A/ROM ?Complete each exercise 10-15X, 2-3X/day ? ?1) Towel crunch ?Place a small towel on a firm table top. Flatten out the towel and then place your hand on one end of it.  Next, flex your fingers 2-5 (index finger through pinky finger) as you pull the towel towards your hand. ? ? ? ?2) Digit composite flexion/adduction (make a fist) ?Hold your hand up as shown. Open and close your hand into a fist and repeat. If you cannot make a full fist, then make a partial fist. ? ? ? ?3) Thumb/finger opposition ?Touch the tip of the thumb to each fingertip one by one. Extend fingers fully after they are touched.  ? ? ? ? ?4) Finger Taps ?Start with the hand flat and fingers slightly spread.  One at a time, starting with the thumb, lift each finger up separately. ? ? ? ?5) PIP Joint Blocking ?Grasp the affected finger, bracing below the middle knuckle, and actively bend the finger as shown. ? ? ? ?6) DIP Joint Blocking ?Grasp the affected finger,  bracing below the last knuckle, and actively bend the finger at the last joint. ? ? ? ? ?7) Digit Abduction/Adduction ?Hold hand palm down flat on table. Spread your fingers apart and back together. ? ? ? ? ? ? ?
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
## 2021-11-23 ENCOUNTER — Encounter: Payer: Self-pay | Admitting: Orthopedic Surgery

## 2021-11-23 ENCOUNTER — Ambulatory Visit: Payer: 59 | Admitting: Orthopedic Surgery

## 2021-12-06 ENCOUNTER — Emergency Department (HOSPITAL_COMMUNITY)
Admission: EM | Admit: 2021-12-06 | Discharge: 2021-12-06 | Disposition: A | Discharge status: Home / Self Care | Payer: 59 | Attending: Emergency Medicine | Admitting: Emergency Medicine

## 2021-12-06 ENCOUNTER — Encounter (HOSPITAL_COMMUNITY): Payer: Self-pay | Admitting: *Deleted

## 2021-12-06 ENCOUNTER — Other Ambulatory Visit: Payer: Self-pay

## 2021-12-06 ENCOUNTER — Emergency Department (HOSPITAL_COMMUNITY): Payer: 59

## 2021-12-06 DIAGNOSIS — Z7982 Long term (current) use of aspirin: Secondary | ICD-10-CM | POA: Insufficient documentation

## 2021-12-06 DIAGNOSIS — K5792 Diverticulitis of intestine, part unspecified, without perforation or abscess without bleeding: Secondary | ICD-10-CM | POA: Diagnosis not present

## 2021-12-06 DIAGNOSIS — R1031 Right lower quadrant pain: Secondary | ICD-10-CM | POA: Diagnosis present

## 2021-12-06 LAB — CBC WITH DIFFERENTIAL/PLATELET
Abs Immature Granulocytes: 0.02 10*3/uL (ref 0.00–0.07)
Basophils Absolute: 0 10*3/uL (ref 0.0–0.1)
Basophils Relative: 1 %
Eosinophils Absolute: 0.3 10*3/uL (ref 0.0–0.5)
Eosinophils Relative: 4 %
HCT: 40 % (ref 36.0–46.0)
Hemoglobin: 12.5 g/dL (ref 12.0–15.0)
Immature Granulocytes: 0 %
Lymphocytes Relative: 34 %
Lymphs Abs: 2.3 10*3/uL (ref 0.7–4.0)
MCH: 25.9 pg — ABNORMAL LOW (ref 26.0–34.0)
MCHC: 31.3 g/dL (ref 30.0–36.0)
MCV: 82.8 fL (ref 80.0–100.0)
Monocytes Absolute: 0.6 10*3/uL (ref 0.1–1.0)
Monocytes Relative: 8 %
Neutro Abs: 3.7 10*3/uL (ref 1.7–7.7)
Neutrophils Relative %: 53 %
Platelets: 304 10*3/uL (ref 150–400)
RBC: 4.83 MIL/uL (ref 3.87–5.11)
RDW: 13.9 % (ref 11.5–15.5)
WBC: 6.8 10*3/uL (ref 4.0–10.5)
nRBC: 0 % (ref 0.0–0.2)

## 2021-12-06 LAB — URINALYSIS, ROUTINE W REFLEX MICROSCOPIC
Bilirubin Urine: NEGATIVE
Glucose, UA: NEGATIVE mg/dL
Hgb urine dipstick: NEGATIVE
Ketones, ur: NEGATIVE mg/dL
Leukocytes,Ua: NEGATIVE
Nitrite: NEGATIVE
Protein, ur: NEGATIVE mg/dL
Specific Gravity, Urine: 1.016 (ref 1.005–1.030)
pH: 5 (ref 5.0–8.0)

## 2021-12-06 LAB — COMPREHENSIVE METABOLIC PANEL
ALT: 15 U/L (ref 0–44)
AST: 13 U/L — ABNORMAL LOW (ref 15–41)
Albumin: 4 g/dL (ref 3.5–5.0)
Alkaline Phosphatase: 116 U/L (ref 38–126)
Anion gap: 4 — ABNORMAL LOW (ref 5–15)
BUN: 17 mg/dL (ref 6–20)
CO2: 23 mmol/L (ref 22–32)
Calcium: 8.6 mg/dL — ABNORMAL LOW (ref 8.9–10.3)
Chloride: 111 mmol/L (ref 98–111)
Creatinine, Ser: 0.76 mg/dL (ref 0.44–1.00)
GFR, Estimated: >60 mL/min (ref >60)
Glucose, Bld: 106 mg/dL — ABNORMAL HIGH (ref 70–99)
Potassium: 3.4 mmol/L — ABNORMAL LOW (ref 3.5–5.1)
Sodium: 138 mmol/L (ref 135–145)
Total Bilirubin: 0.5 mg/dL (ref 0.3–1.2)
Total Protein: 7.4 g/dL (ref 6.5–8.1)

## 2021-12-06 LAB — LIPASE, BLOOD: Lipase: 26 U/L (ref 11–51)

## 2021-12-06 MED ORDER — CIPROFLOXACIN HCL 250 MG PO TABS
500.0000 mg | ORAL_TABLET | Freq: Once | ORAL | Status: AC
  Administered 2021-12-06: 500 mg via ORAL
  Filled 2021-12-06: qty 2

## 2021-12-06 MED ORDER — ONDANSETRON HCL 4 MG/2ML IJ SOLN
4.0000 mg | Freq: Once | INTRAMUSCULAR | Status: AC
  Administered 2021-12-06: 4 mg via INTRAVENOUS
  Filled 2021-12-06: qty 2

## 2021-12-06 MED ORDER — METRONIDAZOLE 500 MG PO TABS
500.0000 mg | ORAL_TABLET | Freq: Once | ORAL | Status: AC
  Administered 2021-12-06: 500 mg via ORAL
  Filled 2021-12-06: qty 1

## 2021-12-06 MED ORDER — IOHEXOL 300 MG/ML  SOLN
100.0000 mL | Freq: Once | INTRAMUSCULAR | Status: AC | PRN
  Administered 2021-12-06: 100 mL via INTRAVENOUS

## 2021-12-06 MED ORDER — IBUPROFEN 600 MG PO TABS: 600.0000 mg | ORAL_TABLET | Freq: Three times a day (TID) | ORAL | 0 refills | Status: DC | PRN

## 2021-12-06 MED ORDER — METRONIDAZOLE 500 MG PO TABS: 500.0000 mg | ORAL_TABLET | Freq: Three times a day (TID) | ORAL | 0 refills | Status: AC

## 2021-12-06 MED ORDER — CIPROFLOXACIN HCL 500 MG PO TABS: 500.0000 mg | ORAL_TABLET | Freq: Two times a day (BID) | ORAL | 0 refills | Status: AC

## 2021-12-06 MED ORDER — MORPHINE SULFATE (PF) 4 MG/ML IV SOLN
4.0000 mg | Freq: Once | INTRAVENOUS | Status: AC
  Administered 2021-12-06: 4 mg via INTRAVENOUS
  Filled 2021-12-06: qty 1

## 2021-12-06 NOTE — ED Triage Notes (Signed)
Pt c/o lower abdominal pain and slight nausea that started last night. Denies vomiting.

## 2021-12-06 NOTE — Discharge Instructions (Addendum)
Please complete the full course of antibiotics as prescribed for diverticulitis.  Do not drink any alcohol while taking these antibiotics, as it will make you very sick.

## 2021-12-06 NOTE — ED Provider Notes (Signed)
[facility] Provider Note   CSN: [id number] Arrival date & time: 12/06/21  4403     History  Chief Complaint  Patient presents with   Abdominal Pain    Erin Murray is a 51 y.o. female presenting to ED with abdominal pain and nausea.  Gradual onset yesterday, mostly RLQ but diffuse lower abdominal pain and rectal pressure, worsening overnight.  +Nausea, no vomiting.  Reports several BM's yesterday (hx of constipation).  Post-menopausal.  Has hx of ovarian cyst and uterine fibroids but no issues in a long time from them.  Hx of lap chole and ovarian cyst surgery, no other surgical hx.  HPI     Home Medications Prior to Admission medications   Medication Sig Start Date End Date Taking? Authorizing Provider  ciprofloxacin (CIPRO) 500 MG tablet Take 1 tablet (500 mg total) by mouth every 12 (twelve) hours for 7 days. 12/06/21 12/13/21 Yes Emina Ribaudo, Carola Rhine, MD  ibuprofen (ADVIL) 600 MG tablet Take 1 tablet (600 mg total) by mouth every 8 (eight) hours as needed for up to 30 doses for moderate pain or mild pain. Take with food 12/06/21  Yes Paras Kreider, Carola Rhine, MD  metroNIDAZOLE (FLAGYL) 500 MG tablet Take 1 tablet (500 mg total) by mouth 3 (three) times daily for 7 days. 12/06/21 12/13/21 Yes Wyvonnia Dusky, MD  aspirin EC 81 MG tablet Take 81 mg by mouth daily.    [provider]  cyclobenzaprine (FLEXERIL) 10 MG tablet Take 10 mg by mouth 3 (three) times daily as needed for muscle spasms.    [provider]  diclofenac (VOLTAREN) 75 MG EC tablet Take 75 mg by mouth 2 (two) times daily. 08/30/19   [provider]  gabapentin (NEURONTIN) 300 MG capsule Take 300 mg by mouth at bedtime.    [provider]  HYDROcodone-acetaminophen (NORCO/VICODIN) 5-325 MG tablet Take 1 tablet by mouth every 4 (four) hours as needed for moderate pain. 08/15/21   Orpah Greek, MD  melatonin 5 MG TABS Take 20 mg by mouth at bedtime as needed.     [provider]  methocarbamol (ROBAXIN) 750 MG tablet Take 750 mg by mouth 3 (three) times daily.     [provider]  metoprolol tartrate (LOPRESSOR) 50 MG tablet TAKE 1 TABLET BY MOUTH TWICE DAILY 02/26/21   Verta Ellen., NP  nitroGLYCERIN (NITROSTAT) 0.4 MG SL tablet Place 1 tablet (0.4 mg total) under the tongue every 5 (five) minutes as needed for chest pain. 08/26/19 10/11/21  Herminio Commons, MD  oxyCODONE-acetaminophen (PERCOCET/ROXICET) 5-325 MG tablet Take 1 tablet by mouth every 6 (six) hours as needed for severe pain. 09/04/21   Alroy Bailiff, Margaux, PA-C  pantoprazole (PROTONIX) 40 MG tablet Take 40 mg by mouth 2 (two) times daily.    [provider]  rizatriptan (MAXALT-MLT) 10 MG disintegrating tablet Take 1 tablet (10 mg total) by mouth as needed for migraine. May repeat in 2 hours if needed 12/30/12   Marcial Pacas, MD  topiramate (TOPAMAX) 50 MG tablet Take 2 tablets (100 mg total) by mouth 2 (two) times daily. One po bid xone week, then 2 tabs po bid Patient taking differently: Take 100 mg by mouth 2 (two) times daily. 12/30/12   Marcial Pacas, MD  vitamin B-12 (CYANOCOBALAMIN) 1000 MCG tablet Take 5,000 mcg by mouth daily.    [provider]      Allergies    Sulfa antibiotics and Penicillins  Review of Systems   Review of Systems  Physical Exam Updated Vital Signs BP 116/82   Pulse 65   Temp 97.9 F (36.6 C) (Oral)   Resp 17   Ht '5\' 6"'$  (1.676 m)   Wt 101.2 kg   LMP 08/24/2019   SpO2 99%   BMI 35.99 kg/m  Physical Exam Constitutional:      General: She is not in acute distress.    Appearance: She is obese.  HENT:     Head: Normocephalic and atraumatic.  Eyes:     Conjunctiva/sclera: Conjunctivae normal.     Pupils: Pupils are equal, round, and reactive to light.  Cardiovascular:     Rate and Rhythm: Normal rate and regular rhythm.  Pulmonary:     Effort: Pulmonary effort is normal. No respiratory distress.  Abdominal:      General: There is no distension.     Tenderness: There is abdominal tenderness in the right lower quadrant and suprapubic area.  Skin:    General: Skin is warm and dry.  Neurological:     General: No focal deficit present.     Mental Status: She is alert. Mental status is at baseline.  Psychiatric:        Mood and Affect: Mood normal.        Behavior: Behavior normal.    ED Results / Procedures / Treatments   Labs (all labs ordered are listed, but only abnormal results are displayed) Labs Reviewed  COMPREHENSIVE METABOLIC PANEL - Abnormal; Notable for the following components:      Result Value   Potassium 3.4 (*)    Glucose, Bld 106 (*)    Calcium 8.6 (*)    AST 13 (*)    Anion gap 4 (*)    All other components within normal limits  CBC WITH DIFFERENTIAL/PLATELET - Abnormal; Notable for the following components:   MCH 25.9 (*)    All other components within normal limits  URINALYSIS, ROUTINE W REFLEX MICROSCOPIC - Abnormal; Notable for the following components:   APPearance HAZY (*)    All other components within normal limits  LIPASE, BLOOD    EKG None  Radiology CT ABDOMEN PELVIS W CONTRAST  Result Date: 12/06/2021 CLINICAL DATA:  Pain right lower quadrant EXAM: CT ABDOMEN AND PELVIS WITH CONTRAST TECHNIQUE: Multidetector CT imaging of the abdomen and pelvis was performed using the standard protocol following bolus administration of intravenous contrast. RADIATION DOSE REDUCTION: This exam was performed according to the departmental dose-optimization program which includes automated exposure control, adjustment of the mA and/or kV according to patient size and/or use of iterative reconstruction technique. CONTRAST:  17m OMNIPAQUE IOHEXOL 300 MG/ML  SOLN COMPARISON:  08/15/2021 FINDINGS: Lower chest: Unremarkable. Hepatobiliary: Liver measures 20.3 cm in length. There is slight prominence of bile ducts which may be due to previous cholecystectomy. Pancreas: No focal  abnormality is seen. Spleen: Unremarkable. Adrenals/Urinary Tract: Adrenals are unremarkable. There is no hydronephrosis. There are no renal or ureteral stones. Urinary bladder is not distended. Stomach/Bowel: Stomach is unremarkable. Small bowel loops are not dilated. Appendix is unremarkable. There is no significant wall thickening in colon. There is mild stranding adjacent to the inferior margin sigmoid colon in the right side of pelvis. There is no definite demonstrable diverticula in colon. There is no loculated pericolic fluid collection. Vascular/Lymphatic: Unremarkable. Reproductive: There is slightly inhomogeneous attenuation in the myometrium. There is small exophytic nodule in the fundus of the uterus. Findings suggest possible uterine fibroids. There  are no adnexal masses. There is no definite inflammatory stranding adjacent to the ovaries. Other: There is no significant ascites or pneumoperitoneum. There is tiny umbilical hernia containing fat. Musculoskeletal: Unremarkable. IMPRESSION: There is no evidence of intestinal obstruction or pneumoperitoneum. Appendix is not dilated. There is no hydronephrosis. There is interval appearance of inflammatory stranding in the fat planes in the right side of pelvis adjacent to sigmoid colon. Findings suggest possible mild acute diverticulitis or inflammation of appendix epiploica or nonspecific colitis. There is no loculated pericolic fluid collection. Other findings as described in the body of the report. Electronically Signed   By: Elmer Picker M.D.   On: 12/06/2021 10:22    Procedures Procedures    Medications Ordered in ED Medications  morphine (PF) 4 MG/ML injection 4 mg (4 mg Intravenous Given 12/06/21 0845)  ondansetron (ZOFRAN) injection 4 mg (4 mg Intravenous Given 12/06/21 0845)  iohexol (OMNIPAQUE) 300 MG/ML solution 100 mL (100 mLs Intravenous Contrast Given 12/06/21 0948)  metroNIDAZOLE (FLAGYL) tablet 500 mg (500 mg Oral Given 12/06/21  1103)  ciprofloxacin (CIPRO) tablet 500 mg (500 mg Oral Given 12/06/21 1104)    ED Course/ Medical Decision Making/ A&P                           Medical Decision Making Amount and/or Complexity of Data Reviewed Labs: ordered. Radiology: ordered.  Risk Prescription drug management.   This patient presents to the Emergency Department with complaint of abdominal pain. This involves an extensive number of treatment options, and is a complaint that carries with it a high risk of complications and morbidity.  The differential diagnosis includes, but is not limited to, appendicitis vs UTI vs ureteral colic vs kidney stone vs diverticulitis vs uterine fibroids vs other  I ordered, reviewed, and interpreted labs, including BMP and CBC.  There were no immediate, life-threatening emergencies found in this labwork.  Patient's UA showed no signs of infection I ordered medication IV morphine, IV zofran for abdominal pain and/or nausea Ciprofloxacin Flagyl ordered for diverticulitis I ordered imaging studies which included CT abdomen pelvis with contrast  I independently visualized and interpreted imaging which showed mild acute diverticulitis and the monitor tracing which showed normal sinus rhythm  After the interventions stated above, I reevaluated the patient and found that they remained clinically stable.  Based on the patient's clinical exam, vital signs, risk factors, and ED testing, I felt that the patient's overall risk of life-threatening emergency such as bowel perforation, surgical emergency, or sepsis was quite low.  I suspect this clinical presentation is most consistent with diverticulitis, but explained to the patient that this evaluation was not a definitive diagnostic workup.  I discussed outpatient follow up with primary care provider, and provided specialist office number on the patient's discharge paper if a referral was deemed necessary.  I discussed return precautions with the  patient. I felt the patient was clinically stable for discharge.         Final Clinical Impression(s) / ED Diagnoses Final diagnoses:  Diverticulitis    Rx / DC Orders ED Discharge Orders          Ordered    ciprofloxacin (CIPRO) 500 MG tablet  Every 12 hours        12/06/21 1044    metroNIDAZOLE (FLAGYL) 500 MG tablet  3 times daily        12/06/21 1044    ibuprofen (ADVIL) 600 MG tablet  Every  8 hours PRN        12/06/21 1044              Chastin Riesgo, Carola Rhine, MD 12/06/21 [phone]

## 2021-12-20 ENCOUNTER — Ambulatory Visit: Payer: 59 | Admitting: Orthopedic Surgery

## 2021-12-26 ENCOUNTER — Ambulatory Visit (INDEPENDENT_AMBULATORY_CARE_PROVIDER_SITE_OTHER): Payer: 59 | Admitting: Orthopedic Surgery

## 2021-12-26 DIAGNOSIS — S6992XD Unspecified injury of left wrist, hand and finger(s), subsequent encounter: Secondary | ICD-10-CM | POA: Diagnosis not present

## 2021-12-26 DIAGNOSIS — S63502D Unspecified sprain of left wrist, subsequent encounter: Secondary | ICD-10-CM | POA: Diagnosis not present
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
## 2021-12-31 ENCOUNTER — Ambulatory Visit: Payer: 59 | Admitting: Orthopedic Surgery

## 2022-01-08 ENCOUNTER — Encounter: Payer: 59 | Admitting: Rehabilitative and Restorative Service Providers"

## 2022-01-16 ENCOUNTER — Ambulatory Visit: Payer: 59 | Admitting: Physical Therapy

## 2022-02-07 ENCOUNTER — Emergency Department (HOSPITAL_COMMUNITY): Payer: 59

## 2022-02-07 ENCOUNTER — Emergency Department (HOSPITAL_COMMUNITY)
Admission: EM | Admit: 2022-02-07 | Discharge: 2022-02-07 | Disposition: A | Discharge status: Home / Self Care | Payer: 59 | Attending: Emergency Medicine | Admitting: Emergency Medicine

## 2022-02-07 ENCOUNTER — Encounter (HOSPITAL_COMMUNITY): Payer: Self-pay

## 2022-02-07 ENCOUNTER — Other Ambulatory Visit: Payer: Self-pay

## 2022-02-07 DIAGNOSIS — G8929 Other chronic pain: Secondary | ICD-10-CM | POA: Diagnosis not present

## 2022-02-07 DIAGNOSIS — M25551 Pain in right hip: Secondary | ICD-10-CM | POA: Insufficient documentation

## 2022-02-07 DIAGNOSIS — Z7982 Long term (current) use of aspirin: Secondary | ICD-10-CM | POA: Insufficient documentation

## 2022-02-07 DIAGNOSIS — M545 Low back pain, unspecified: Secondary | ICD-10-CM | POA: Diagnosis not present

## 2022-02-07 DIAGNOSIS — W19XXXA Unspecified fall, initial encounter: Secondary | ICD-10-CM | POA: Diagnosis not present

## 2022-02-07 MED ORDER — OXYCODONE HCL 5 MG PO TABS: 5.0000 mg | ORAL_TABLET | Freq: Four times a day (QID) | ORAL | 0 refills | Status: DC | PRN

## 2022-02-07 MED ORDER — METHYLPREDNISOLONE 4 MG PO TBPK: ORAL_TABLET | ORAL | 0 refills | Status: DC
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
## 2022-02-07 NOTE — ED Provider Notes (Signed)
[facility] EMERGENCY DEPARTMENT Provider Note   CSN: [id number] Arrival date & time: 02/07/22  1002     History  Chief Complaint  Patient presents with   Erin Murray is a 51 y.o. female presenting to the ED with complaint of right hip pain and back pain after fall.  She says she slipped getting out of the tub yesterday and landed on her right hip.  She has been able to walk but it is quite painful.  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  She is also complaining of pain in her lower back and a strain on the right side of her neck.  She denies prior history of spinal or hip surgery.  She does have chronic low back pain.  She takes gabapentin chronically for neuropathy, but only at night.  HPI     Home Medications Prior to Admission medications   Medication Sig Start Date End Date Taking? Authorizing Provider  methylPREDNISolone (MEDROL DOSEPAK) 4 MG TBPK tablet Use as directed on package 02/07/22  Yes Elnoria Livingston, Carola Rhine, MD  oxyCODONE (ROXICODONE) 5 MG immediate release tablet Take 1 tablet (5 mg total) by mouth every 6 (six) hours as needed for up to 10 doses for severe pain. 02/07/22  Yes Wyvonnia Dusky, MD  aspirin EC 81 MG tablet Take 81 mg by mouth daily.    [provider]  cyclobenzaprine (FLEXERIL) 10 MG tablet Take 10 mg by mouth 3 (three) times daily as needed for muscle spasms.    [provider]  diclofenac (VOLTAREN) 75 MG EC tablet Take 75 mg by mouth 2 (two) times daily. 08/30/19   [provider]  gabapentin (NEURONTIN) 300 MG capsule Take 300 mg by mouth at bedtime.    [provider]  HYDROcodone-acetaminophen (NORCO/VICODIN) 5-325 MG tablet Take 1 tablet by mouth every 4 (four) hours as needed for moderate pain. 08/15/21   Orpah Greek, MD  ibuprofen (ADVIL) 600 MG tablet Take 1 tablet (600 mg total) by mouth every 8 (eight) hours as needed for up to 30 doses for moderate  pain or mild pain. Take with food 12/06/21   Wyvonnia Dusky, MD  melatonin 5 MG TABS Take 20 mg by mouth at bedtime as needed.    [provider]  methocarbamol (ROBAXIN) 750 MG tablet Take 750 mg by mouth 3 (three) times daily.     [provider]  metoprolol tartrate (LOPRESSOR) 50 MG tablet TAKE 1 TABLET BY MOUTH TWICE DAILY 02/26/21   Verta Ellen., NP  nitroGLYCERIN (NITROSTAT) 0.4 MG SL tablet Place 1 tablet (0.4 mg total) under the tongue every 5 (five) minutes as needed for chest pain. 08/26/19 10/11/21  Herminio Commons, MD  oxyCODONE-acetaminophen (PERCOCET/ROXICET) 5-325 MG tablet Take 1 tablet by mouth every 6 (six) hours as needed for severe pain. 09/04/21   Alroy Bailiff, Margaux, PA-C  pantoprazole (PROTONIX) 40 MG tablet Take 40 mg by mouth 2 (two) times daily.    [provider]  rizatriptan (MAXALT-MLT) 10 MG disintegrating tablet Take 1 tablet (10 mg total) by mouth as needed for migraine. May repeat in 2 hours if needed 12/30/12   Marcial Pacas, MD  topiramate (TOPAMAX) 50 MG tablet Take 2 tablets (100 mg total) by mouth 2 (two) times daily. One po bid xone week, then 2 tabs po bid Patient taking differently: Take 100 mg by mouth 2 (two)  times daily. 12/30/12   Marcial Pacas, MD  vitamin B-12 (CYANOCOBALAMIN) 1000 MCG tablet Take 5,000 mcg by mouth daily.    [provider]      Allergies    Sulfa antibiotics and Penicillins    Review of Systems   Review of Systems  Physical Exam Updated Vital Signs BP 124/73 (BP Location: Right Arm)   Pulse 82   Temp 98 F (36.7 C) (Oral)   Resp 17   Ht '5\' 6"'$  (1.676 m)   Wt 102.1 kg   LMP 08/24/2019   SpO2 94%   BMI 36.32 kg/m  Physical Exam Constitutional:      General: She is not in acute distress. HENT:     Head: Normocephalic and atraumatic.  Eyes:     Conjunctiva/sclera: Conjunctivae normal.     Pupils: Pupils are equal, round, and reactive to light.  Cardiovascular:     Rate and Rhythm:  Normal rate and regular rhythm.  Pulmonary:     Effort: Pulmonary effort is normal. No respiratory distress.  Musculoskeletal:     Comments: Right iliac tenderness, no visible deformity or ecchymoses. Lumbar spine midline tenderness on exam. No cervical midline tenderness.  Patient does have some paracervical tenderness  Skin:    General: Skin is warm and dry.  Neurological:     General: No focal deficit present.     Mental Status: She is alert and oriented to person, place, and time. Mental status is at baseline.  Psychiatric:        Mood and Affect: Mood normal.        Behavior: Behavior normal.     ED Results / Procedures / Treatments   Labs (all labs ordered are listed, but only abnormal results are displayed) Labs Reviewed - No data to display  EKG None  Radiology CT Lumbar Spine Wo Contrast  Result Date: 02/07/2022 CLINICAL DATA:  51 year old female status post fall 2 nights ago. Back and hip pain. EXAM: CT LUMBAR SPINE WITHOUT CONTRAST TECHNIQUE: Multidetector CT imaging of the lumbar spine was performed without intravenous contrast administration. Multiplanar CT image reconstructions were also generated. RADIATION DOSE REDUCTION: This exam was performed according to the departmental dose-optimization program which includes automated exposure control, adjustment of the mA and/or kV according to patient size and/or use of iterative reconstruction technique. COMPARISON:  CT Abdomen and Pelvis 12/06/2021. FINDINGS: Segmentation: Normal. Alignment: Stable lumbar lordosis since May. There is subtle retrolisthesis of L5 on S1 and subtle anterolisthesis of L4 on L5. Vertebrae: No acute osseous abnormality identified. Intact visible sacrum and SI joints. Paraspinal and other soft tissues: Stable, negative. Disc levels: T12-L1 through L2-L3 appear negative. L3-L4: Mild disc bulging. Moderate facet hypertrophy. No significant stenosis. L4-L5: Subtle anterolisthesis. Circumferential disc  bulge. Severe facet hypertrophy with bilateral vacuum facet and subchondral cysts. Mild spinal stenosis (series 4, image 76). Mild right L4 foraminal stenosis. L5-S1: Subtle retrolisthesis. Minor disc bulging. Moderate facet hypertrophy. No stenosis. IMPRESSION: 1. No acute osseous abnormality in the lumbar spine. 2. Severe facet arthropathy at L4-L5 with subtle underlying spondylolisthesis. Mild spinal stenosis. Mild right L4 foraminal stenosis. Electronically Signed   By: Genevie Ann M.D.   On: 02/07/2022 11:10   DG Hip Unilat W or Wo Pelvis 2-3 Views Right  Result Date: 02/07/2022 CLINICAL DATA:  Acute RIGHT hip pain following fall. Initial encounter. EXAM: DG HIP (WITH OR WITHOUT PELVIS) 3V RIGHT COMPARISON:  None Available. FINDINGS: There is no evidence of acute fracture, subluxation or dislocation.  Mild joint space narrowing/osteophytosis in both hips identified. No focal bony lesions are noted. IMPRESSION: 1. No evidence of acute abnormality. 2. Mild degenerative changes in both hips. Electronically Signed   By: Margarette Canada M.D.   On: 02/07/2022 11:10    Procedures Procedures    Medications Ordered in ED Medications - No data to display  ED Course/ Medical Decision Making/ A&P                           Medical Decision Making Amount and/or Complexity of Data Reviewed Radiology: ordered.  Risk Prescription drug management.   Mechanical fall with back and hip pain and neck pain.  X-rays of the hip and CT scan of the lumbar spine ordered to evaluate for fracture, personally reviewed and interpreted, showing bulging disc, no acute fracture  She is neurovascularly intact.  She is able to ambulate steadily.  Doubt cauda equina syndrome.  No indication for MRI at this time emergently.  Cervical pain appears to be a muscle strain.  She did not directly impact or strike her neck.  I doubt there is a cervical fracture.        Final Clinical Impression(s) / ED Diagnoses Final diagnoses:   Fall, initial encounter  Right hip pain    Rx / DC Orders ED Discharge Orders          Ordered    oxyCODONE (ROXICODONE) 5 MG immediate release tablet  Every 6 hours PRN        02/07/22 1152    methylPREDNISolone (MEDROL DOSEPAK) 4 MG TBPK tablet        02/07/22 1152              Wyvonnia Dusky, MD 02/07/22 1427

## 2022-02-17 ENCOUNTER — Encounter (HOSPITAL_COMMUNITY): Payer: Self-pay | Admitting: Emergency Medicine

## 2022-02-17 ENCOUNTER — Emergency Department (HOSPITAL_COMMUNITY): Payer: 59

## 2022-02-17 ENCOUNTER — Other Ambulatory Visit: Payer: Self-pay

## 2022-02-17 ENCOUNTER — Emergency Department (HOSPITAL_COMMUNITY)
Admission: EM | Admit: 2022-02-17 | Discharge: 2022-02-17 | Disposition: A | Discharge status: Home / Self Care | Payer: 59 | Attending: Emergency Medicine | Admitting: Emergency Medicine

## 2022-02-17 DIAGNOSIS — E860 Dehydration: Secondary | ICD-10-CM | POA: Insufficient documentation

## 2022-02-17 DIAGNOSIS — R1032 Left lower quadrant pain: Secondary | ICD-10-CM | POA: Diagnosis not present

## 2022-02-17 DIAGNOSIS — Z7982 Long term (current) use of aspirin: Secondary | ICD-10-CM | POA: Diagnosis not present

## 2022-02-17 DIAGNOSIS — Z79899 Other long term (current) drug therapy: Secondary | ICD-10-CM | POA: Insufficient documentation

## 2022-02-17 DIAGNOSIS — R197 Diarrhea, unspecified: Secondary | ICD-10-CM | POA: Diagnosis not present

## 2022-02-17 LAB — COMPREHENSIVE METABOLIC PANEL
ALT: 49 U/L — ABNORMAL HIGH (ref 0–44)
AST: 22 U/L (ref 15–41)
Albumin: 3.5 g/dL (ref 3.5–5.0)
Alkaline Phosphatase: 97 U/L (ref 38–126)
Anion gap: 10 (ref 5–15)
BUN: 19 mg/dL (ref 6–20)
CO2: 24 mmol/L (ref 22–32)
Calcium: 8.7 mg/dL — ABNORMAL LOW (ref 8.9–10.3)
Chloride: 105 mmol/L (ref 98–111)
Creatinine, Ser: 0.76 mg/dL (ref 0.44–1.00)
GFR, Estimated: >60 mL/min (ref >60)
Glucose, Bld: 106 mg/dL — ABNORMAL HIGH (ref 70–99)
Potassium: 2.9 mmol/L — ABNORMAL LOW (ref 3.5–5.1)
Sodium: 139 mmol/L (ref 135–145)
Total Bilirubin: 0.6 mg/dL (ref 0.3–1.2)
Total Protein: 7.1 g/dL (ref 6.5–8.1)

## 2022-02-17 LAB — CBC
HCT: 39.8 % (ref 36.0–46.0)
Hemoglobin: 12.6 g/dL (ref 12.0–15.0)
MCH: 25.4 pg — ABNORMAL LOW (ref 26.0–34.0)
MCHC: 31.7 g/dL (ref 30.0–36.0)
MCV: 80.2 fL (ref 80.0–100.0)
Platelets: 322 10*3/uL (ref 150–400)
RBC: 4.96 MIL/uL (ref 3.87–5.11)
RDW: 14.2 % (ref 11.5–15.5)
WBC: 7.4 10*3/uL (ref 4.0–10.5)
nRBC: 0 % (ref 0.0–0.2)

## 2022-02-17 LAB — LIPASE, BLOOD: Lipase: 74 U/L — ABNORMAL HIGH (ref 11–51)

## 2022-02-17 MED ORDER — POTASSIUM CHLORIDE 20 MEQ PO PACK
60.0000 meq | PACK | Freq: Once | ORAL | Status: AC
  Administered 2022-02-17: 60 meq via ORAL
  Filled 2022-02-17: qty 3

## 2022-02-17 MED ORDER — DICYCLOMINE HCL 20 MG PO TABS: 20.0000 mg | ORAL_TABLET | Freq: Two times a day (BID) | ORAL | 0 refills | Status: DC

## 2022-02-17 MED ORDER — IOHEXOL 300 MG/ML  SOLN
100.0000 mL | Freq: Once | INTRAMUSCULAR | Status: AC | PRN
  Administered 2022-02-17: 100 mL via INTRAVENOUS

## 2022-02-17 MED ORDER — SODIUM CHLORIDE 0.9 % IV BOLUS
1000.0000 mL | Freq: Once | INTRAVENOUS | Status: AC
  Administered 2022-02-17: 1000 mL via INTRAVENOUS

## 2022-02-17 NOTE — ED Triage Notes (Signed)
Pt has had non stop diarrhea x 5 days. Pt states she has had fevers on and off. Diarrhea has been multiple times per hour. Pt feels weak and headache. Lower abd pain constant worse when having diarrhea.

## 2022-02-17 NOTE — ED Provider Notes (Signed)
[facility] EMERGENCY DEPARTMENT Provider Note   CSN: [id number] Arrival date & time: 02/17/22  7846     History Chief Complaint  Patient presents with   Diarrhea    PEJA ALLENDER is a 51 y.o. female patient with history of diverticulitis who presents to the emergency department today with a 5-day history of watery nonbloody diarrhea.  Patient also is endorsing constant left lower quadrant abdominal pain that is worsened when she has to have a bowel movement.  She denies any nausea, vomiting, urinary complaints, cough, congestion, sore throat.  She does also endorse associated fever and chills.   Diarrhea      Home Medications Prior to Admission medications   Medication Sig Start Date End Date Taking? Authorizing Provider  dicyclomine (BENTYL) 20 MG tablet Take 1 tablet (20 mg total) by mouth 2 (two) times daily. 02/17/22  Yes Myna Bright M, PA-C  aspirin EC 81 MG tablet Take 81 mg by mouth daily.    [provider]  cyclobenzaprine (FLEXERIL) 10 MG tablet Take 10 mg by mouth 3 (three) times daily as needed for muscle spasms.    [provider]  diclofenac (VOLTAREN) 75 MG EC tablet Take 75 mg by mouth 2 (two) times daily. 08/30/19   [provider]  gabapentin (NEURONTIN) 300 MG capsule Take 300 mg by mouth at bedtime.    [provider]  HYDROcodone-acetaminophen (NORCO/VICODIN) 5-325 MG tablet Take 1 tablet by mouth every 4 (four) hours as needed for moderate pain. 08/15/21   Orpah Greek, MD  ibuprofen (ADVIL) 600 MG tablet Take 1 tablet (600 mg total) by mouth every 8 (eight) hours as needed for up to 30 doses for moderate pain or mild pain. Take with food 12/06/21   Wyvonnia Dusky, MD  melatonin 5 MG TABS Take 20 mg by mouth at bedtime as needed.    [provider]  methocarbamol (ROBAXIN) 750 MG tablet Take 750 mg by mouth 3 (three) times daily.     [provider]  methylPREDNISolone (MEDROL DOSEPAK) 4 MG TBPK  tablet Use as directed on package 02/07/22   Wyvonnia Dusky, MD  metoprolol tartrate (LOPRESSOR) 50 MG tablet TAKE 1 TABLET BY MOUTH TWICE DAILY 02/26/21   Verta Ellen., NP  nitroGLYCERIN (NITROSTAT) 0.4 MG SL tablet Place 1 tablet (0.4 mg total) under the tongue every 5 (five) minutes as needed for chest pain. 08/26/19 10/11/21  Herminio Commons, MD  oxyCODONE (ROXICODONE) 5 MG immediate release tablet Take 1 tablet (5 mg total) by mouth every 6 (six) hours as needed for up to 10 doses for severe pain. 02/07/22   Wyvonnia Dusky, MD  oxyCODONE-acetaminophen (PERCOCET/ROXICET) 5-325 MG tablet Take 1 tablet by mouth every 6 (six) hours as needed for severe pain. 09/04/21   Alroy Bailiff, Margaux, PA-C  pantoprazole (PROTONIX) 40 MG tablet Take 40 mg by mouth 2 (two) times daily.    [provider]  rizatriptan (MAXALT-MLT) 10 MG disintegrating tablet Take 1 tablet (10 mg total) by mouth as needed for migraine. May repeat in 2 hours if needed 12/30/12   Marcial Pacas, MD  topiramate (TOPAMAX) 50 MG tablet Take 2 tablets (100 mg total) by mouth 2 (two) times daily. One po bid xone week, then 2 tabs po bid Patient taking differently: Take 100 mg by mouth 2 (two) times daily. 12/30/12   Marcial Pacas, MD  vitamin B-12 (CYANOCOBALAMIN) 1000 MCG tablet Take 5,000 mcg by mouth daily.  [provider]      Allergies    Sulfa antibiotics and Penicillins    Review of Systems   Review of Systems  Gastrointestinal:  Positive for diarrhea.  All other systems reviewed and are negative.   Physical Exam Updated Vital Signs BP (!) 142/92   Pulse 88   Temp 98.8 F (37.1 C) (Oral)   Resp 18   Ht '5\' 6"'$  (1.676 m)   Wt 99.3 kg   LMP 08/24/2019   SpO2 96%   BMI 35.35 kg/m  Physical Exam Vitals and nursing note reviewed.  Constitutional:      General: She is not in acute distress.    Appearance: Normal appearance.  HENT:     Head: Normocephalic and atraumatic.  Eyes:     General:         Right eye: No discharge.        Left eye: No discharge.  Cardiovascular:     Comments: Regular rate and rhythm.  S1/S2 are distinct without any evidence of murmur, rubs, or gallops.  Radial pulses are 2+ bilaterally.  Dorsalis pedis pulses are 2+ bilaterally.  No evidence of pedal edema. Pulmonary:     Comments: Clear to auscultation bilaterally.  Normal effort.  No respiratory distress.  No evidence of wheezes, rales, or rhonchi heard throughout. Abdominal:     General: Abdomen is flat. Bowel sounds are normal. There is no distension.     Tenderness: There is abdominal tenderness in the left lower quadrant. There is no guarding or rebound.  Musculoskeletal:        General: Normal range of motion.     Cervical back: Neck supple.  Skin:    General: Skin is warm and dry.     Findings: No rash.  Neurological:     General: No focal deficit present.     Mental Status: She is alert.  Psychiatric:        Mood and Affect: Mood normal.        Behavior: Behavior normal.     ED Results / Procedures / Treatments   Labs (all labs ordered are listed, but only abnormal results are displayed) Labs Reviewed  LIPASE, BLOOD - Abnormal; Notable for the following components:      Result Value   Lipase 74 (*)    All other components within normal limits  COMPREHENSIVE METABOLIC PANEL - Abnormal; Notable for the following components:   Potassium 2.9 (*)    Glucose, Bld 106 (*)    Calcium 8.7 (*)    ALT 49 (*)    All other components within normal limits  CBC - Abnormal; Notable for the following components:   MCH 25.4 (*)    All other components within normal limits  STOOL CULTURE    EKG None  Radiology CT ABDOMEN PELVIS W CONTRAST  Result Date: 02/17/2022 CLINICAL DATA:  Left lower quadrant abdominal pain with diarrhea for 5 days. Intermittent fever. EXAM: CT ABDOMEN AND PELVIS WITH CONTRAST TECHNIQUE: Multidetector CT imaging of the abdomen and pelvis was performed using the standard  protocol following bolus administration of intravenous contrast. RADIATION DOSE REDUCTION: This exam was performed according to the departmental dose-optimization program which includes automated exposure control, adjustment of the mA and/or kV according to patient size and/or use of iterative reconstruction technique. CONTRAST:  154m OMNIPAQUE IOHEXOL 300 MG/ML  SOLN COMPARISON:  Abdominopelvic CT 12/06/2021 and 08/15/2021. FINDINGS: Lower chest: Clear lung bases. No significant pleural or pericardial effusion. Hepatobiliary:  The liver is normal in density without suspicious focal abnormality. Stable mild biliary prominence post cholecystectomy, within physiologic limits. Pancreas: Unremarkable. No pancreatic ductal dilatation or surrounding inflammatory changes. Spleen: Normal in size without focal abnormality. Adrenals/Urinary Tract: Both adrenal glands appear normal. The kidneys appear normal without evidence of urinary tract calculus, suspicious lesion or hydronephrosis. The bladder appears unremarkable for its degree of distention. Stomach/Bowel: No enteric contrast administered. The stomach appears unremarkable for its degree of distension. No evidence of bowel wall thickening, distention or surrounding inflammatory change. The appendix appears normal. Vascular/Lymphatic: There are no enlarged abdominal or pelvic lymph nodes. No significant vascular findings. Reproductive: Possible small uterine fundal fibroid as before. No adnexal mass. Other: No evidence of abdominal wall mass or hernia. No ascites. Musculoskeletal: No acute or significant osseous findings. Mild spondylosis. IMPRESSION: Stable abdominopelvic CT without acute findings or explanation for the patient's symptoms. Electronically Signed   By: Richardean Sale M.D.   On: 02/17/2022 11:35    Procedures Procedures    Medications Ordered in ED Medications  sodium chloride 0.9 % bolus 1,000 mL (has no administration in time range)  iohexol  (OMNIPAQUE) 300 MG/ML solution 100 mL (100 mLs Intravenous Contrast Given 02/17/22 1108)  potassium chloride (KLOR-CON) packet 60 mEq (60 mEq Oral Given 02/17/22 1226)    ED Course/ Medical Decision Making/ A&P Clinical Course as of 02/17/22 1302  Sun Feb 17, 2022  1301 CBC(!) Normal. [CF]  1301 Lipase, blood(!) Slightly elevated but does not meet criteria for pancreatitis. [CF]  1301 Comprehensive metabolic panel(!) Hypokalemia in the setting of diarrhea. [CF]  33 CT ABDOMEN PELVIS W CONTRAST I personally ordered and interpreted CT abdomen pelvis with contrast which does not show any evidence of diverticulitis, pancreatitis, or other acute abdominal findings.  I do agree with the radiologist interpretation. [CF]  1302 On reevaluation, patient is feeling better after fluids.  She has not had a bowel movement since being in the emergency department today.  Shared decision-making was done with the patient on whether to continue to wait to provide a stool specimen or provide a stool specimen in the outpatient setting with her primary care doctor.  Patient shows primary care doctor option.  I think this is reasonable plan. [CF]    Clinical Course User Index [CF] Hendricks Limes, PA-C                           Medical Decision Making NAASIA WEILBACHER is a 51 y.o. female patient who presents to the emergency department today for evaluation of left lower quadrant abdominal pain and diarrhea.  I am suspicious for diverticulitis at this time given her history and the clinical picture.  Patient also appears dry on physical exam likely secondary to the volume of watery diarrhea.  I will give her some fluids get some basic labs and plan to get a CT abdomen pelvis with contrast to further evaluate.  Patient's vital signs are normal apart from some slightly elevated blood pressure.  She is overall well-appearing and in no acute distress at this time.  I will plan to reassess once some of the work-up  results.  Patient will follow-up with primary care doctor for further evaluation.  Strict return precautions were discussed.  She is safe for discharge at this time.  Amount and/or Complexity of Data Reviewed Labs: ordered. Decision-making details documented in ED Course. Radiology: ordered and independent interpretation performed. Decision-making details documented in  ED Course.  Risk Prescription drug management.    Final Clinical Impression(s) / ED Diagnoses Final diagnoses:  Diarrhea, unspecified type  Dehydration  Left lower quadrant abdominal pain    Rx / DC Orders ED Discharge Orders          Ordered    dicyclomine (BENTYL) 20 MG tablet  2 times daily        02/17/22 1301              Myna Bright [place], [place] 02/17/22 1303    Milton Ferguson, MD 02/17/22 1348

## 2022-02-17 NOTE — Discharge Instructions (Signed)
PleasePlease follow-up with your primary care doctor to get a outpatient stool culture specimen.  Plenty of fluids.  Stick to a brat diet like we discussed.  You may also use soup broth as well.  Slowly incorporate your normal diet as tolerated.  Return to the emergency room for any worsening symptoms you might have.

## 2022-02-20 ENCOUNTER — Telehealth: Payer: Self-pay | Admitting: Orthopedic Surgery

## 2022-02-20 NOTE — Telephone Encounter (Signed)
Patient called to first verify her appointment for tomorrow, 02/21/22, for her hand, which we have verified. Patient said there are a couple levels of questions she has, including having hip pain, which she said she thinks is okay now, and also about therapy. Please call her work # (before 4:30pm) [phone] and select option 3 - states her cell phone is broken at this time.

## 2022-02-20 NOTE — Telephone Encounter (Signed)
???    I can't advise over phone for something we haven't seen or treated. I called to let her know and no one answered and recording said voice mail not set up.   Dr Aline Brochure will help her tomorrow hopefully but her appointment is for her hand If she has questions about her hip a new appointment may need to be made for that.

## 2022-02-20 NOTE — Telephone Encounter (Signed)
Called back to patient; understands. States did not get to do any therapy as she's been sick; therefore, rescheduling appointment for hand.

## 2022-02-21 ENCOUNTER — Ambulatory Visit: Payer: 59 | Admitting: Orthopedic Surgery

## 2022-03-14 ENCOUNTER — Ambulatory Visit: Payer: 59 | Admitting: Orthopedic Surgery

## 2022-05-12 ENCOUNTER — Emergency Department (HOSPITAL_COMMUNITY)
Admission: EM | Admit: 2022-05-12 | Discharge: 2022-05-12 | Discharge status: Against Medical Advice | Payer: 59 | Attending: Emergency Medicine | Admitting: Emergency Medicine

## 2022-05-12 ENCOUNTER — Encounter (HOSPITAL_COMMUNITY): Payer: Self-pay | Admitting: Emergency Medicine

## 2022-05-12 ENCOUNTER — Other Ambulatory Visit: Payer: Self-pay

## 2022-05-12 ENCOUNTER — Emergency Department (HOSPITAL_COMMUNITY): Payer: 59

## 2022-05-12 DIAGNOSIS — R079 Chest pain, unspecified: Secondary | ICD-10-CM | POA: Insufficient documentation

## 2022-05-12 DIAGNOSIS — Z5321 Procedure and treatment not carried out due to patient leaving prior to being seen by health care provider: Secondary | ICD-10-CM | POA: Insufficient documentation

## 2022-05-12 LAB — BASIC METABOLIC PANEL
Anion gap: 6 (ref 5–15)
BUN: 15 mg/dL (ref 6–20)
CO2: 26 mmol/L (ref 22–32)
Calcium: 9.1 mg/dL (ref 8.9–10.3)
Chloride: 108 mmol/L (ref 98–111)
Creatinine, Ser: 0.66 mg/dL (ref 0.44–1.00)
GFR, Estimated: >60 mL/min (ref >60)
Glucose, Bld: 102 mg/dL — ABNORMAL HIGH (ref 70–99)
Potassium: 3.6 mmol/L (ref 3.5–5.1)
Sodium: 140 mmol/L (ref 135–145)

## 2022-05-12 LAB — CBC
HCT: 42.4 % (ref 36.0–46.0)
Hemoglobin: 13.4 g/dL (ref 12.0–15.0)
MCH: 25.8 pg — ABNORMAL LOW (ref 26.0–34.0)
MCHC: 31.6 g/dL (ref 30.0–36.0)
MCV: 81.7 fL (ref 80.0–100.0)
Platelets: 325 10*3/uL (ref 150–400)
RBC: 5.19 MIL/uL — ABNORMAL HIGH (ref 3.87–5.11)
RDW: 14.1 % (ref 11.5–15.5)
WBC: 7.3 10*3/uL (ref 4.0–10.5)
nRBC: 0 % (ref 0.0–0.2)

## 2022-05-12 LAB — TROPONIN I (HIGH SENSITIVITY)
Troponin I (High Sensitivity): <2 ng/L (ref <18)
Troponin I (High Sensitivity): <2 ng/L (ref <18)

## 2022-05-12 NOTE — ED Triage Notes (Signed)
Patient c/o central chest pain that radiates under her left breast that started at 1300.  Patient took home nitro that helped, but pain is starting to come back.

## 2022-05-17 IMAGING — DX DG WRIST COMPLETE 3+V*L*
4 series · 4 of 4 positions shown · non-contrast
Comparison: Left wrist 08/15/2021

CLINICAL DATA: Fall

EXAM:
LEFT WRIST - COMPLETE 3+ VIEW

[wrist ap (1 of 2)]
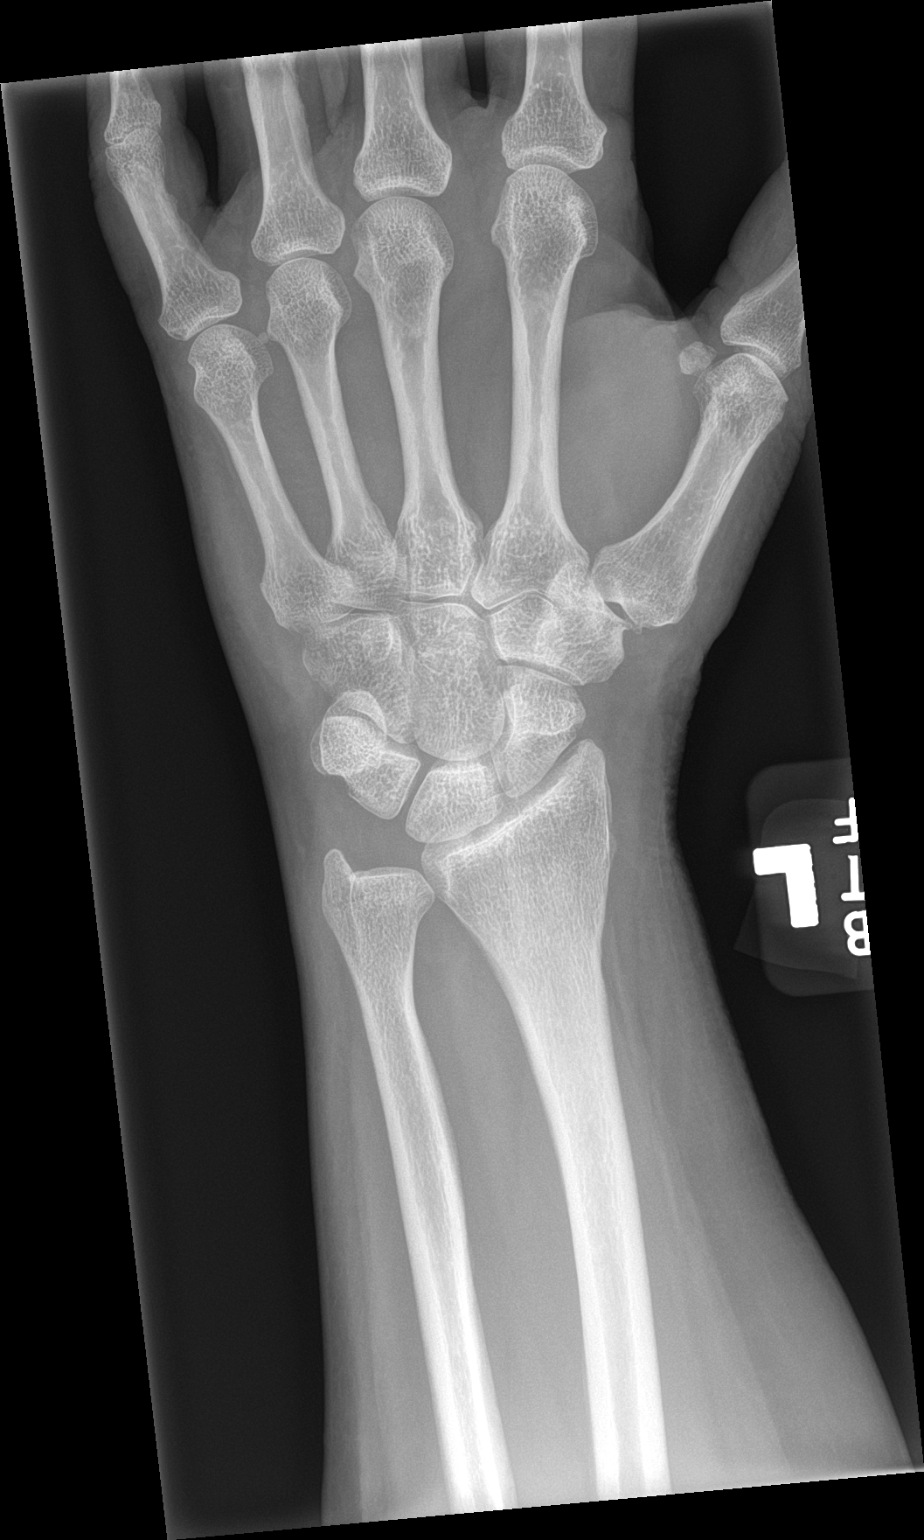

[wrist obl]
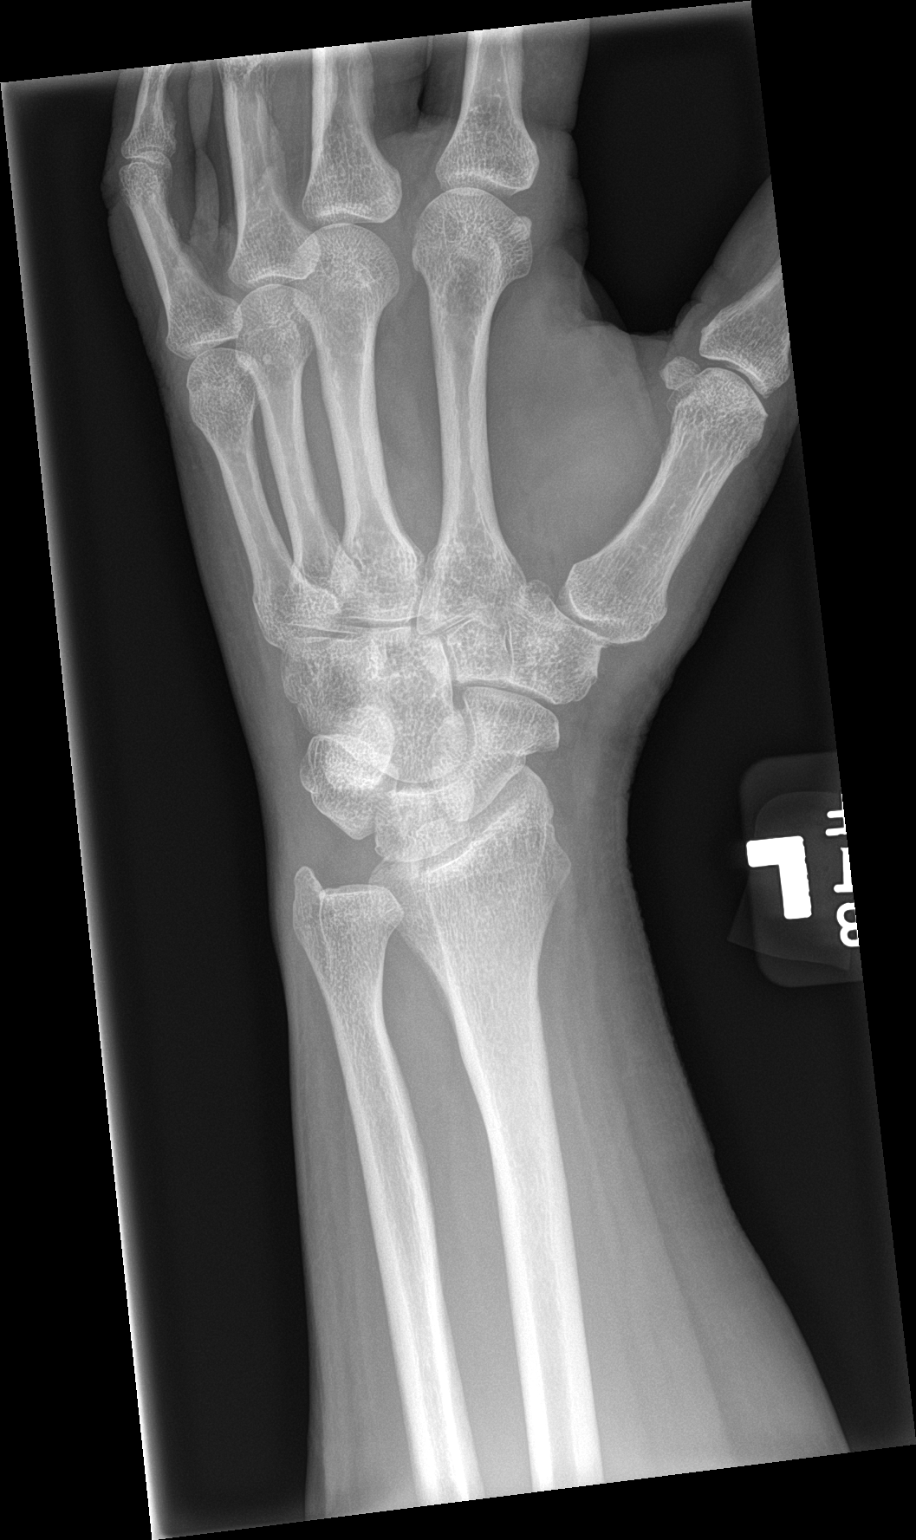

[wrist tunnel]
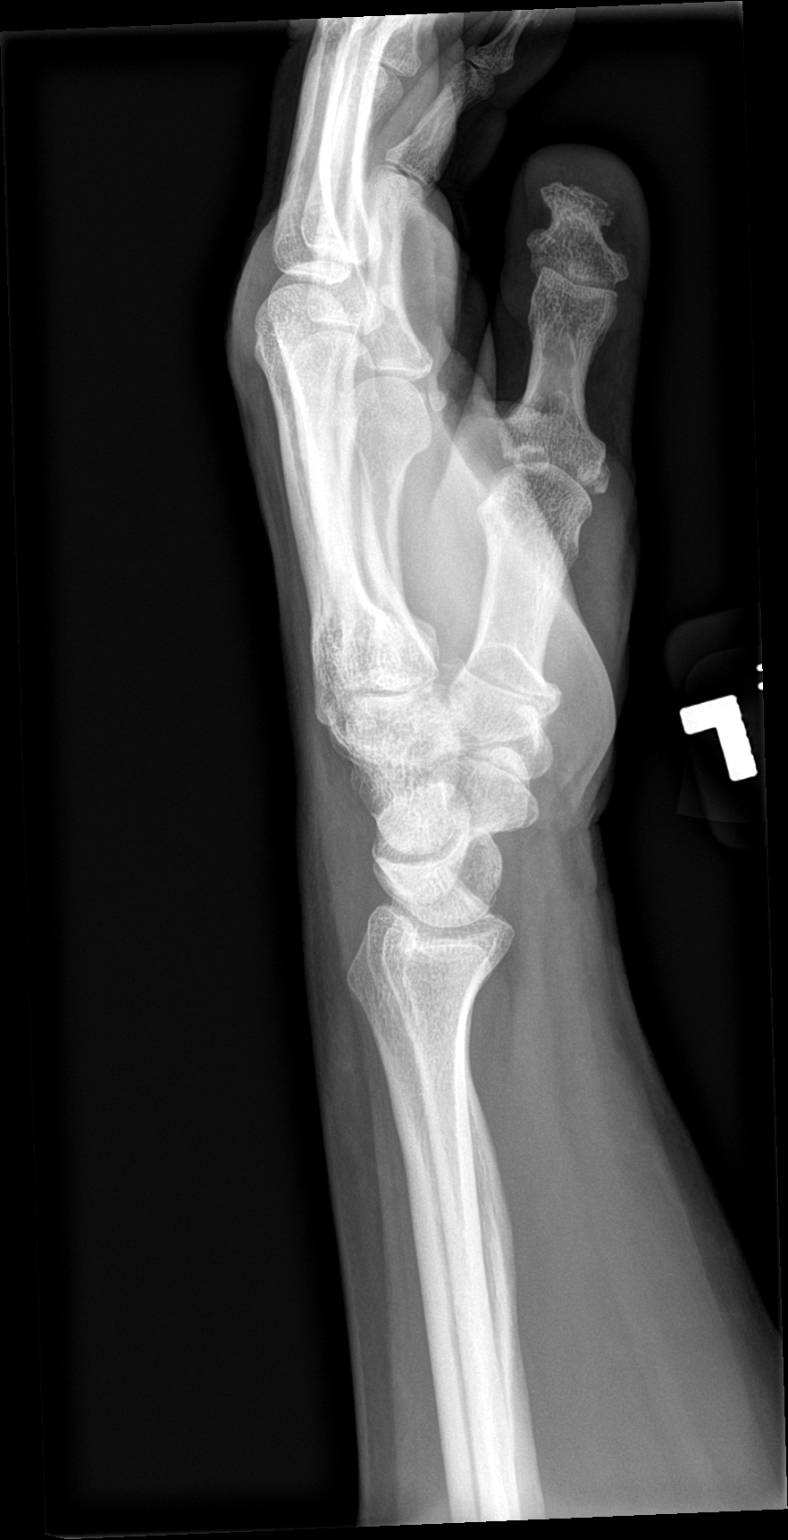

[wrist ap (2 of 2)]
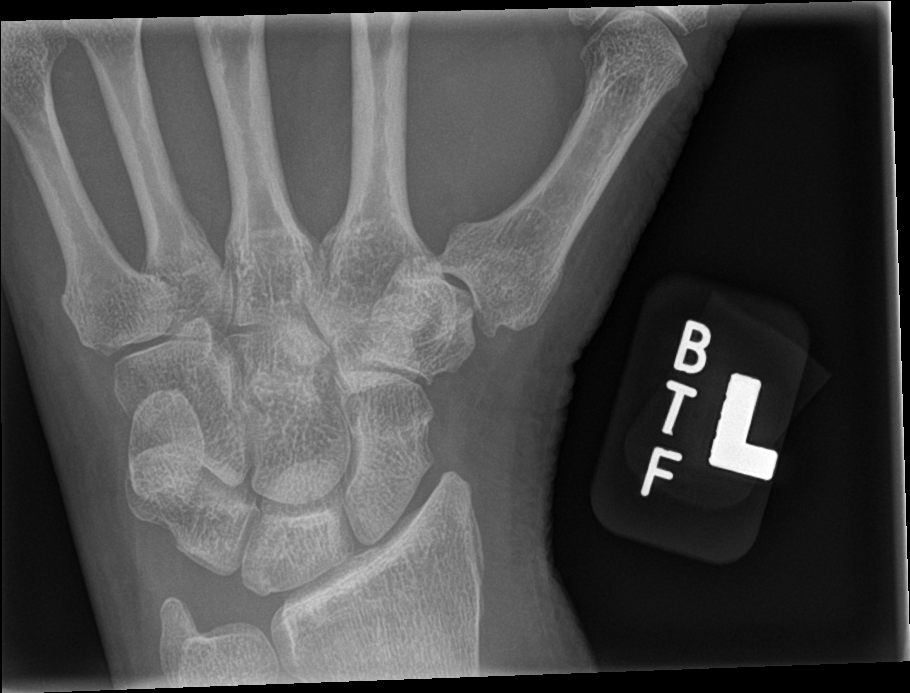

[4 of 4 positions shown; findings below may reference images not displayed]

FINDINGS: There is no evidence of fracture or dislocation. There is no
evidence of arthropathy or other focal bone abnormality. Soft
tissues are unremarkable.
IMPRESSION: Negative.

## 2022-05-21 ENCOUNTER — Encounter: Payer: Self-pay | Admitting: Gastroenterology

## 2022-05-25 ENCOUNTER — Encounter (INDEPENDENT_AMBULATORY_CARE_PROVIDER_SITE_OTHER): Payer: Self-pay | Admitting: Gastroenterology

## 2022-06-01 NOTE — Progress Notes (Deleted)
GI Office Note    Referring Provider: Johnette Abraham, MD Primary Care Physician:  Johnette Abraham, MD  Primary Gastroenterologist: Elon Alas. Abbey Chatters, DO   Chief Complaint   No chief complaint on file.    History of Present Illness   Erin Murray is a 51 y.o. female presenting today at the request of Doren Custard Hazle Nordmann, MD for ***abdominal pain.  Colonoscopy 12/29/2009: normal. Advised repeat in 5 years due to family history of polyps and cancer.    Per review of chart it appears patient had ED visit for RLQ abdominal pain and rectal pressure in May this year with CT showing possible mild diverticulitis and treated with cipro and flagyl. ED visit August 2023 with 5 days of non bloody diarrhea and LLQ pain and fever/chills. CT in August with no acute findings.   Per review of referral paperwork on 05/17/22 patient reported intermittent LUQ abdominal pain since cholecystectomy. Reported alternating constipation and diarrhea. Taking omeprazole, recently had symptoms after eating spaghetti. Eats prior to going to bed. Given probiotics, dicyclomine, and advised miralax daily. Advised gallbladder and IBS eating plan.    Today:    Current Outpatient Medications  Medication Sig Dispense Refill   aspirin EC 81 MG tablet Take 81 mg by mouth daily.     cyclobenzaprine (FLEXERIL) 10 MG tablet Take 10 mg by mouth 3 (three) times daily as needed for muscle spasms.     diclofenac (VOLTAREN) 75 MG EC tablet Take 75 mg by mouth 2 (two) times daily.     dicyclomine (BENTYL) 20 MG tablet Take 1 tablet (20 mg total) by mouth 2 (two) times daily. 20 tablet 0   gabapentin (NEURONTIN) 300 MG capsule Take 300 mg by mouth at bedtime.     HYDROcodone-acetaminophen (NORCO/VICODIN) 5-325 MG tablet Take 1 tablet by mouth every 4 (four) hours as needed for moderate pain. 10 tablet 0   ibuprofen (ADVIL) 600 MG tablet Take 1 tablet (600 mg total) by mouth every 8 (eight) hours as needed for up to 30 doses  for moderate pain or mild pain. Take with food 30 tablet 0   melatonin 5 MG TABS Take 20 mg by mouth at bedtime as needed.     methocarbamol (ROBAXIN) 750 MG tablet Take 750 mg by mouth 3 (three) times daily.      methylPREDNISolone (MEDROL DOSEPAK) 4 MG TBPK tablet Use as directed on package 21 tablet 0   metoprolol tartrate (LOPRESSOR) 50 MG tablet TAKE 1 TABLET BY MOUTH TWICE DAILY 60 tablet 0   nitroGLYCERIN (NITROSTAT) 0.4 MG SL tablet Place 1 tablet (0.4 mg total) under the tongue every 5 (five) minutes as needed for chest pain. 25 tablet 3   oxyCODONE (ROXICODONE) 5 MG immediate release tablet Take 1 tablet (5 mg total) by mouth every 6 (six) hours as needed for up to 10 doses for severe pain. 10 tablet 0   oxyCODONE-acetaminophen (PERCOCET/ROXICET) 5-325 MG tablet Take 1 tablet by mouth every 6 (six) hours as needed for severe pain. 6 tablet 0   pantoprazole (PROTONIX) 40 MG tablet Take 40 mg by mouth 2 (two) times daily.     rizatriptan (MAXALT-MLT) 10 MG disintegrating tablet Take 1 tablet (10 mg total) by mouth as needed for migraine. May repeat in 2 hours if needed 15 tablet 6   topiramate (TOPAMAX) 50 MG tablet Take 2 tablets (100 mg total) by mouth 2 (two) times daily. One po bid xone week, then 2  tabs po bid (Patient taking differently: Take 100 mg by mouth 2 (two) times daily.) 120 tablet 12   vitamin B-12 (CYANOCOBALAMIN) 1000 MCG tablet Take 5,000 mcg by mouth daily.     No current facility-administered medications for this visit.    Past Medical History:  Diagnosis Date   Complicated migraine    includes: visual changes, left sided weakness, confusion, weakness, dizziness   Fibromyalgia    Fibromyalgia    Headache(784.0)    Reflux    Seizures ([place])    TIA (transient ischemic attack) 2010    Past Surgical History:  Procedure Laterality Date   CHOLECYSTECTOMY     ESOPHAGOGASTRODUODENOSCOPY N/A 10/14/2014   Procedure: ESOPHAGOGASTRODUODENOSCOPY (EGD);  Surgeon: Rogene Houston, MD;  [facility]: [facility];  Service: Endoscopy;  Laterality: N/A;  210 - moved to 12:45 - Ann to notify pt   OVARIAN CYST DRAINAGE     TUBAL LIGATION      Family History  Problem Relation Age of Onset   High Cholesterol Father    Heart disease Father    High Cholesterol Mother    Asthma Mother    COPD Maternal Grandfather     Allergies as of 06/03/2022 - Review Complete 05/12/2022  Allergen Reaction Noted   Sulfa antibiotics Nausea And Vomiting and Other (See Comments) 02/20/2012   Penicillins Hives 02/20/2012    Social History   Socioeconomic History   Marital status: Married    Spouse name: Erin Murray   Number of children: 3   Years of education: Not on file   Highest education level: Not on file  Occupational History   Occupation: day care  Tobacco Use   Smoking status: Never   Smokeless tobacco: Never  Vaping Use   Vaping Use: Never used  Substance and Sexual Activity   Alcohol use: No   Drug use: No   Sexual activity: Yes    Birth control/protection: Surgical  Other Topics Concern   Not on file  Social History Narrative   Patient lives at home with her husband Erin Murray), three children. Patient works at at a day care. Patient has three children. 31 years old.   Left handed.   Caffeine two cups of coffee daily   Social Determinants of Health   Financial Resource Strain: Not on file  Food Insecurity: Not on file  Transportation Needs: Not on file  Physical Activity: Not on file  Stress: Not on file  Social Connections: Not on file  Intimate Partner Violence: Not on file     Review of Systems   Gen: Denies any fever, chills, fatigue, weight loss, lack of appetite.  CV: Denies chest pain, heart palpitations, peripheral edema, syncope.  Resp: Denies shortness of breath at rest or with exertion. Denies wheezing or cough.  GI: see HPI GU : Denies urinary burning, urinary frequency, urinary hesitancy MS: Denies joint pain, muscle weakness, cramps,  or limitation of movement.  Derm: Denies rash, itching, dry skin Psych: Denies depression, anxiety, memory loss, and confusion Heme: Denies bruising, bleeding, and enlarged lymph nodes.   Physical Exam   LMP 08/24/2019   General:   Alert and oriented. Pleasant and cooperative. Well-nourished and well-developed.  Head:  Normocephalic and atraumatic. Eyes:  Without icterus, sclera clear and conjunctiva pink.  Ears:  Normal auditory acuity. Mouth:  No deformity or lesions, oral mucosa pink.  Lungs:  Clear to auscultation bilaterally. No wheezes, rales, or rhonchi. No distress.  Heart:  S1, S2 present without  murmurs appreciated.  Abdomen:  +BS, soft, non-tender and non-distended. No HSM noted. No guarding or rebound. No masses appreciated.  Rectal:  Deferred  Msk:  Symmetrical without gross deformities. Normal posture. Extremities:  Without edema. Neurologic:  Alert and  oriented x4;  grossly normal neurologically. Skin:  Intact without significant lesions or rashes. Psych:  Alert and cooperative. Normal mood and affect.   Assessment   KEYAIRRA KOLINSKI is a 51 y.o. female with a history of fibromyalgia, GERD, seizures, and TIA in 2010*** presenting today with ***  Abdominal pain:   Screening for colon cancer: Last colonoscopy in 2011 normal and advised repeat in 5 years. Well overdue per records.    PLAN   *** Proceed with colonoscopy with propofol by Dr. Abbey Chatters  in near future: the risks, benefits, and alternatives have been discussed with the patient in detail. The patient states understanding and desires to proceed. ASA 3?***    Venetia Night, MSN, FNP-BC, AGACNP-BC [facility]

## 2022-06-03 ENCOUNTER — Ambulatory Visit: Payer: 59 | Admitting: Gastroenterology

## 2022-06-04 ENCOUNTER — Encounter: Payer: Self-pay | Admitting: Internal Medicine

## 2022-06-04 ENCOUNTER — Ambulatory Visit (INDEPENDENT_AMBULATORY_CARE_PROVIDER_SITE_OTHER): Payer: 59 | Admitting: Internal Medicine

## 2022-06-04 VITALS — BP 120/78 | HR 118 | Ht 66.0 in | Wt 227.0 lb

## 2022-06-04 DIAGNOSIS — Z1231 Encounter for screening mammogram for malignant neoplasm of breast: Secondary | ICD-10-CM | POA: Diagnosis not present

## 2022-06-04 DIAGNOSIS — I10 Essential (primary) hypertension: Secondary | ICD-10-CM

## 2022-06-04 DIAGNOSIS — R69 Illness, unspecified: Secondary | ICD-10-CM | POA: Diagnosis not present

## 2022-06-04 DIAGNOSIS — Z1211 Encounter for screening for malignant neoplasm of colon: Secondary | ICD-10-CM | POA: Diagnosis not present

## 2022-06-04 DIAGNOSIS — R569 Unspecified convulsions: Secondary | ICD-10-CM | POA: Diagnosis not present

## 2022-06-04 DIAGNOSIS — Z0001 Encounter for general adult medical examination with abnormal findings: Secondary | ICD-10-CM | POA: Diagnosis not present

## 2022-06-04 DIAGNOSIS — R1012 Left upper quadrant pain: Secondary | ICD-10-CM | POA: Diagnosis not present

## 2022-06-04 DIAGNOSIS — Z124 Encounter for screening for malignant neoplasm of cervix: Secondary | ICD-10-CM

## 2022-06-04 DIAGNOSIS — Z23 Encounter for immunization: Secondary | ICD-10-CM | POA: Diagnosis not present

## 2022-06-04 DIAGNOSIS — R202 Paresthesia of skin: Secondary | ICD-10-CM

## 2022-06-04 DIAGNOSIS — G459 Transient cerebral ischemic attack, unspecified: Secondary | ICD-10-CM

## 2022-06-04 DIAGNOSIS — Z114 Encounter for screening for human immunodeficiency virus [HIV]: Secondary | ICD-10-CM

## 2022-06-04 DIAGNOSIS — G43809 Other migraine, not intractable, without status migrainosus: Secondary | ICD-10-CM

## 2022-06-04 DIAGNOSIS — R000 Tachycardia, unspecified: Secondary | ICD-10-CM

## 2022-06-04 DIAGNOSIS — M797 Fibromyalgia: Secondary | ICD-10-CM

## 2022-06-04 MED ORDER — CYCLOBENZAPRINE HCL 10 MG PO TABS: 10.0000 mg | ORAL_TABLET | Freq: Three times a day (TID) | ORAL | 2 refills | Status: DC | PRN

## 2022-06-04 MED ORDER — TOPIRAMATE 50 MG PO TABS: 100.0000 mg | ORAL_TABLET | Freq: Two times a day (BID) | ORAL | 12 refills | Status: DC

## 2022-06-04 MED ORDER — METHOCARBAMOL 750 MG PO TABS: 750.0000 mg | ORAL_TABLET | Freq: Three times a day (TID) | ORAL | 2 refills | Status: AC | PRN

## 2022-06-04 MED ORDER — PANTOPRAZOLE SODIUM 40 MG PO TBEC
40.0000 mg | DELAYED_RELEASE_TABLET | Freq: Two times a day (BID) | ORAL | 2 refills | Status: DC
Start: 2022-06-04 — End: 2022-09-17

## 2022-06-04 MED ORDER — METOPROLOL TARTRATE 50 MG PO TABS: 50.0000 mg | ORAL_TABLET | Freq: Two times a day (BID) | ORAL | 2 refills | Status: DC

## 2022-06-04 MED ORDER — RIZATRIPTAN BENZOATE 10 MG PO TBDP: 10.0000 mg | ORAL_TABLET | ORAL | 6 refills | Status: AC | PRN

## 2022-06-04 NOTE — Assessment & Plan Note (Signed)
BP 120/78 today.  She is currently prescribed metoprolol tartrate 50 mg twice daily.  No medication changes today.

## 2022-06-04 NOTE — Assessment & Plan Note (Signed)
She reports a history of chronic left upper quadrant abdominal pain without clear etiology.  She is concerned that she has diverticulitis.  She underwent CTAP in August that did not show any acute intra-abdominal findings to explain her pain.  She has requested a referral to GI today.

## 2022-06-04 NOTE — Assessment & Plan Note (Signed)
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  She is prescribed metoprolol tartrate 50 mg twice daily for management of this syndrome.  Metoprolol has been refilled today.  Consider referral to cardiology at future appointments.

## 2022-06-04 NOTE — Assessment & Plan Note (Signed)
She endorses a history of bilateral leg "tremors".  She states that this is different from restless leg syndrome.  She has previously been prescribed muscle relaxers for symptom relief and requests a refill of Robaxin today.

## 2022-06-04 NOTE — Assessment & Plan Note (Signed)
She endorses a history of fibromyalgia.  She currently takes gabapentin 300 mg nightly.  No medication changes today

## 2022-06-04 NOTE — Assessment & Plan Note (Signed)
Previous history of TIA 2010.  She is currently prescribed ASA 81 mg daily.  She has not experienced a recurrence of symptoms since that event.  No medication changes today.

## 2022-06-04 NOTE — Assessment & Plan Note (Signed)
She is currently prescribed Topamax 100 mg twice daily and uses Maxalt as needed.  Both medications have been refilled today.

## 2022-06-04 NOTE — Progress Notes (Signed)
New Patient Office Visit  Subjective    Patient ID: Erin Murray, female    DOB: [birth year]  Age: 51 y.o. MRN: [id number]  CC:  Chief Complaint  Patient presents with   Establish Care   HPI Erin Murray presents to establish care. She is a 51 year-old woman who reports a past medical history significant for TIA (2010), fibromyalgia, migraines, unspecified tachycardia syndrome, and "leg tremors".  She has not had a primary care provider in many years and has been presenting to emergency department to receive medical care as needed. Today Erin Murray reports feeling well.  She is asymptomatic and her acute concerns are requesting referrals to GI for evaluation of unspecified LUQ abdominal pain as well as neurology for evaluation of lower extremity tremors and management of migraines.  Acute concerns, chronic medical conditions, and outstanding preventative care items discussed today are individually addressed in A/P below.  Outpatient Encounter Medications as of 06/04/2022  Medication Sig   aspirin EC 81 MG tablet Take 81 mg by mouth daily.   melatonin 5 MG TABS Take 20 mg by mouth at bedtime as needed.   nitroGLYCERIN (NITROSTAT) 0.4 MG SL tablet Place 1 tablet (0.4 mg total) under the tongue every 5 (five) minutes as needed for chest pain.   vitamin B-12 (CYANOCOBALAMIN) 1000 MCG tablet Take 5,000 mcg by mouth daily.   [DISCONTINUED] cyclobenzaprine (FLEXERIL) 10 MG tablet Take 10 mg by mouth 3 (three) times daily as needed for muscle spasms.   [DISCONTINUED] diclofenac (VOLTAREN) 75 MG EC tablet Take 75 mg by mouth 2 (two) times daily.   [DISCONTINUED] dicyclomine (BENTYL) 20 MG tablet Take 1 tablet (20 mg total) by mouth 2 (two) times daily.   [DISCONTINUED] gabapentin (NEURONTIN) 300 MG capsule Take 300 mg by mouth at bedtime.   [DISCONTINUED] ibuprofen (ADVIL) 600 MG tablet Take 1 tablet (600 mg total) by mouth every 8 (eight) hours as needed for up to 30 doses for moderate pain or  mild pain. Take with food   [DISCONTINUED] methocarbamol (ROBAXIN) 750 MG tablet Take 750 mg by mouth 3 (three) times daily.    [DISCONTINUED] metoprolol tartrate (LOPRESSOR) 50 MG tablet TAKE 1 TABLET BY MOUTH TWICE DAILY   [DISCONTINUED] pantoprazole (PROTONIX) 40 MG tablet Take 40 mg by mouth 2 (two) times daily.   [DISCONTINUED] rizatriptan (MAXALT-MLT) 10 MG disintegrating tablet Take 1 tablet (10 mg total) by mouth as needed for migraine. May repeat in 2 hours if needed   [DISCONTINUED] topiramate (TOPAMAX) 50 MG tablet Take 2 tablets (100 mg total) by mouth 2 (two) times daily. One po bid xone week, then 2 tabs po bid (Patient taking differently: Take 100 mg by mouth 2 (two) times daily.)   methocarbamol (ROBAXIN) 750 MG tablet Take 1 tablet (750 mg total) by mouth every 8 (eight) hours as needed for muscle spasms.   metoprolol tartrate (LOPRESSOR) 50 MG tablet Take 1 tablet (50 mg total) by mouth 2 (two) times daily.   pantoprazole (PROTONIX) 40 MG tablet Take 1 tablet (40 mg total) by mouth 2 (two) times daily.   rizatriptan (MAXALT-MLT) 10 MG disintegrating tablet Take 1 tablet (10 mg total) by mouth as needed for migraine. May repeat in 2 hours if needed   topiramate (TOPAMAX) 50 MG tablet Take 2 tablets (100 mg total) by mouth 2 (two) times daily. One po bid xone week, then 2 tabs po bid   [DISCONTINUED] cyclobenzaprine (FLEXERIL) 10 MG tablet Take 1 tablet (10  mg total) by mouth 3 (three) times daily as needed for muscle spasms.   [DISCONTINUED] HYDROcodone-acetaminophen (NORCO/VICODIN) 5-325 MG tablet Take 1 tablet by mouth every 4 (four) hours as needed for moderate pain.   [DISCONTINUED] methylPREDNISolone (MEDROL DOSEPAK) 4 MG TBPK tablet Use as directed on package   [DISCONTINUED] oxyCODONE (ROXICODONE) 5 MG immediate release tablet Take 1 tablet (5 mg total) by mouth every 6 (six) hours as needed for up to 10 doses for severe pain.   [DISCONTINUED] oxyCODONE-acetaminophen  (PERCOCET/ROXICET) 5-325 MG tablet Take 1 tablet by mouth every 6 (six) hours as needed for severe pain.   No facility-administered encounter medications on file as of 06/04/2022.    Past Medical History:  Diagnosis Date   Complicated migraine    includes: visual changes, left sided weakness, confusion, weakness, dizziness   Fibromyalgia    Fibromyalgia    Headache(784.0)    Reflux    Seizures ([place])    TIA (transient ischemic attack) 2010    Past Surgical History:  Procedure Laterality Date   CHOLECYSTECTOMY     ESOPHAGOGASTRODUODENOSCOPY N/A 10/14/2014   Procedure: ESOPHAGOGASTRODUODENOSCOPY (EGD);  Surgeon: Rogene Houston, MD;  Location: [facility];  Service: Endoscopy;  Laterality: N/A;  210 - moved to 12:45 - Ann to notify pt   OVARIAN CYST DRAINAGE     TUBAL LIGATION      Family History  Problem Relation Age of Onset   High Cholesterol Father    Heart disease Father    High Cholesterol Mother    Asthma Mother    COPD Maternal Grandfather     Social History   Socioeconomic History   Marital status: Married    Spouse name: Erin Murray   Number of children: 3   Years of education: Not on file   Highest education level: Not on file  Occupational History   Occupation: day care  Tobacco Use   Smoking status: Never   Smokeless tobacco: Never  Vaping Use   Vaping Use: Never used  Substance and Sexual Activity   Alcohol use: No   Drug use: No   Sexual activity: Yes    Birth control/protection: Surgical  Other Topics Concern   Not on file  Social History Narrative   Patient lives at home with her husband Erin Murray), three children. Patient works at at a day care. Patient has three children. 12 years old.   Left handed.   Caffeine two cups of coffee daily   Social Determinants of Health   Financial Resource Strain: Not on file  Food Insecurity: Not on file  Transportation Needs: Not on file  Physical Activity: Not on file  Stress: Not on file  Social  Connections: Not on file  Intimate Partner Violence: Not on file   Review of Systems  Constitutional:  Negative for chills and fever.  HENT:  Negative for sore throat.   Respiratory:  Negative for cough and shortness of breath.   Cardiovascular:  Negative for chest pain, palpitations and leg swelling.  Gastrointestinal:  Negative for abdominal pain, blood in stool, constipation, diarrhea, nausea and vomiting.  Genitourinary:  Negative for dysuria and hematuria.  Musculoskeletal:  Negative for myalgias.  Skin:  Negative for itching and rash.  Neurological:  Negative for dizziness and headaches.  Psychiatric/Behavioral:  Negative for depression and suicidal ideas.    Objective    BP 120/78   Pulse (!) 118   Ht '5\' 6"'$  (1.676 m)   Wt 227 lb (103 kg)  LMP 08/24/2019   SpO2 95%   BMI 36.64 kg/m   Physical Exam Vitals reviewed.  Constitutional:      General: She is not in acute distress.    Appearance: Normal appearance. She is obese. She is not toxic-appearing.  HENT:     Head: Normocephalic and atraumatic.     Right Ear: External ear normal.     Left Ear: External ear normal.     Nose: Nose normal. No congestion or rhinorrhea.     Mouth/Throat:     Mouth: Mucous membranes are moist.     Pharynx: Oropharynx is clear. No oropharyngeal exudate or posterior oropharyngeal erythema.  Eyes:     General: No scleral icterus.    Extraocular Movements: Extraocular movements intact.     Conjunctiva/sclera: Conjunctivae normal.     Pupils: Pupils are equal, round, and reactive to light.  Cardiovascular:     Rate and Rhythm: Regular rhythm. Tachycardia present.     Pulses: Normal pulses.     Heart sounds: Normal heart sounds. No murmur heard.    No friction rub. No gallop.  Pulmonary:     Effort: Pulmonary effort is normal.     Breath sounds: Normal breath sounds. No wheezing, rhonchi or rales.  Abdominal:     General: Abdomen is flat. Bowel sounds are normal. There is no  distension.     Palpations: Abdomen is soft.     Tenderness: There is no abdominal tenderness.  Musculoskeletal:        General: No swelling. Normal range of motion.     Cervical back: Normal range of motion.     Right lower leg: No edema.     Left lower leg: No edema.  Lymphadenopathy:     Cervical: No cervical adenopathy.  Skin:    General: Skin is warm and dry.     Capillary Refill: Capillary refill takes less than 2 seconds.     Coloration: Skin is not jaundiced.  Neurological:     General: No focal deficit present.     Mental Status: She is alert and oriented to person, place, and time.  Psychiatric:        Mood and Affect: Mood normal.        Behavior: Behavior normal.    Assessment & Plan:   Problem List Items Addressed This Visit       TIA (transient ischemic attack)    Previous history of TIA 2010.  She is currently prescribed ASA 81 mg daily.  She has not experienced a recurrence of symptoms since that event.  No medication changes today.      Migraine    She is currently prescribed Topamax 100 mg twice daily and uses Maxalt as needed.  Both medications have been refilled today.      Hypertension    BP 120/78 today.  She is currently prescribed metoprolol tartrate 50 mg twice daily.  No medication changes today.      Fibromyalgia    She endorses a history of fibromyalgia.  She currently takes gabapentin 300 mg nightly.  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  She is prescribed metoprolol tartrate 50 mg twice daily for management of this syndrome.  Metoprolol has been refilled today.  Consider referral to cardiology at future appointments.      Bilateral leg paresthesia    She  endorses a history of bilateral leg "tremors".  She states that this is different from restless leg syndrome.  She has previously been prescribed muscle relaxers for symptom  relief and requests a refill of Robaxin today.      Encounter for general adult medical examination with abnormal findings    Presenting today to establish care.  Previous records and labs were reviewed. -Baseline labs been ordered today, including one-time HIV screening -Influenza and zoster vaccines have been administered today -Screening mammogram ordered today -She has been referred to GI for screening colonoscopy -OB/GYN referral placed for Pap smear -Follow-up in 3 months      Left upper quadrant abdominal pain    She reports a history of chronic left upper quadrant abdominal pain without clear etiology.  She is concerned that she has diverticulitis.  She underwent CTAP in August that did not show any acute intra-abdominal findings to explain her pain.  She has requested a referral to GI today.      Return in about 3 months (around 09/04/2022).   Johnette Abraham, MD

## 2022-06-04 NOTE — Patient Instructions (Signed)
It was a pleasure to see you today.  Thank you for giving [place] the opportunity to be involved in your care.  Below is a brief recap of your visit and next steps.  We will plan to see you again in 3 months.  Summary You have established care today I have placed referrals to GI, OB/GYN, and neurology We will check labs today and you will receive flu & shingles shots We will plan for follow up in 3 months

## 2022-06-04 NOTE — Addendum Note (Signed)
Addended by: Marland Kitchen E on: 06/04/2022 05:33 PM   Modules accepted: Level of Service

## 2022-06-04 NOTE — Assessment & Plan Note (Signed)
Presenting today to establish care.  Previous records and labs were reviewed. -Baseline labs been ordered today, including one-time HIV screening -Influenza and zoster vaccines have been administered today -Screening mammogram ordered today -She has been referred to GI for screening colonoscopy -OB/GYN referral placed for Pap smear -Follow-up in 3 months

## 2022-06-05 ENCOUNTER — Encounter: Payer: Self-pay | Admitting: Gastroenterology

## 2022-06-05 LAB — CBC WITH DIFFERENTIAL/PLATELET
Basophils Absolute: 0.1 10*3/uL (ref 0.0–0.2)
Basos: 1 %
EOS (ABSOLUTE): 0.3 10*3/uL (ref 0.0–0.4)
Eos: 3 %
Hematocrit: 40.5 % (ref 34.0–46.6)
Hemoglobin: 13 g/dL (ref 11.1–15.9)
Immature Grans (Abs): 0 10*3/uL (ref 0.0–0.1)
Immature Granulocytes: 0 %
Lymphocytes Absolute: 3.4 10*3/uL — ABNORMAL HIGH (ref 0.7–3.1)
Lymphs: 38 %
MCH: 25.4 pg — ABNORMAL LOW (ref 26.6–33.0)
MCHC: 32.1 g/dL (ref 31.5–35.7)
MCV: 79 fL (ref 79–97)
Monocytes Absolute: 0.7 10*3/uL (ref 0.1–0.9)
Monocytes: 8 %
Neutrophils Absolute: 4.5 10*3/uL (ref 1.4–7.0)
Neutrophils: 50 %
Platelets: 381 10*3/uL (ref 150–450)
RBC: 5.11 x10E6/uL (ref 3.77–5.28)
RDW: 14.1 % (ref 11.7–15.4)
WBC: 9 10*3/uL (ref 3.4–10.8)

## 2022-06-05 LAB — B12 AND FOLATE PANEL
Folate: 9.5 ng/mL (ref >3.0)
Vitamin B-12: 335 pg/mL (ref 232–1245)

## 2022-06-05 LAB — LIPID PANEL
Chol/HDL Ratio: 5.3 ratio — ABNORMAL HIGH (ref 0.0–4.4)
Cholesterol, Total: 186 mg/dL (ref 100–199)
HDL: 35 mg/dL — ABNORMAL LOW (ref >39)
LDL Chol Calc (NIH): 123 mg/dL — ABNORMAL HIGH (ref 0–99)
Triglycerides: 157 mg/dL — ABNORMAL HIGH (ref 0–149)
VLDL Cholesterol Cal: 28 mg/dL (ref 5–40)

## 2022-06-05 LAB — CMP14+EGFR
ALT: 16 IU/L (ref 0–32)
AST: 16 IU/L (ref 0–40)
Albumin/Globulin Ratio: 1.5 (ref 1.2–2.2)
Albumin: 4.4 g/dL (ref 3.8–4.9)
Alkaline Phosphatase: 150 IU/L — ABNORMAL HIGH (ref 44–121)
BUN/Creatinine Ratio: 19 (ref 9–23)
BUN: 14 mg/dL (ref 6–24)
Bilirubin Total: <0.2 mg/dL (ref 0.0–1.2)
CO2: 18 mmol/L — ABNORMAL LOW (ref 20–29)
Calcium: 9.5 mg/dL (ref 8.7–10.2)
Chloride: 107 mmol/L — ABNORMAL HIGH (ref 96–106)
Creatinine, Ser: 0.74 mg/dL (ref 0.57–1.00)
Globulin, Total: 3 g/dL (ref 1.5–4.5)
Glucose: 94 mg/dL (ref 70–99)
Potassium: 4.2 mmol/L (ref 3.5–5.2)
Sodium: 139 mmol/L (ref 134–144)
Total Protein: 7.4 g/dL (ref 6.0–8.5)
eGFR: 98 mL/min/{1.73_m2} (ref >59)

## 2022-06-05 LAB — VITAMIN D 25 HYDROXY (VIT D DEFICIENCY, FRACTURES): Vit D, 25-Hydroxy: 23.5 ng/mL — ABNORMAL LOW (ref 30.0–100.0)

## 2022-06-05 LAB — TSH+FREE T4
Free T4: 1 ng/dL (ref 0.82–1.77)
TSH: 3.86 u[IU]/mL (ref 0.450–4.500)

## 2022-06-05 LAB — HEMOGLOBIN A1C
Est. average glucose Bld gHb Est-mCnc: 137 mg/dL
Hgb A1c MFr Bld: 6.4 % — ABNORMAL HIGH (ref 4.8–5.6)

## 2022-06-05 LAB — HIV ANTIBODY (ROUTINE TESTING W REFLEX): HIV Screen 4th Generation wRfx: NONREACTIVE

## 2022-06-10 ENCOUNTER — Telehealth: Payer: Self-pay

## 2022-06-10 NOTE — Telephone Encounter (Signed)
I tired calling patient again today no answer and no voice mail set up. ep

## 2022-06-20 ENCOUNTER — Encounter: Payer: Self-pay | Admitting: Emergency Medicine

## 2022-06-20 ENCOUNTER — Ambulatory Visit
Admission: EM | Admit: 2022-06-20 | Discharge: 2022-06-20 | Disposition: A | Discharge status: Home / Self Care | Payer: 59 | Attending: Family Medicine | Admitting: Family Medicine

## 2022-06-20 ENCOUNTER — Ambulatory Visit (INDEPENDENT_AMBULATORY_CARE_PROVIDER_SITE_OTHER): Payer: 59

## 2022-06-20 DIAGNOSIS — M25561 Pain in right knee: Secondary | ICD-10-CM

## 2022-06-20 DIAGNOSIS — M25551 Pain in right hip: Secondary | ICD-10-CM

## 2022-06-20 DIAGNOSIS — W19XXXA Unspecified fall, initial encounter: Secondary | ICD-10-CM | POA: Diagnosis not present

## 2022-06-20 MED ORDER — NAPROXEN 500 MG PO TABS: 500.0000 mg | ORAL_TABLET | Freq: Two times a day (BID) | ORAL | 0 refills | Status: DC | PRN

## 2022-06-20 MED ORDER — CYCLOBENZAPRINE HCL 10 MG PO TABS: 10.0000 mg | ORAL_TABLET | Freq: Three times a day (TID) | ORAL | 0 refills | Status: AC | PRN

## 2022-06-20 NOTE — ED Triage Notes (Signed)
[address] today on a tile floor.  Right hips pain, knee pain, and lower right leg pain.

## 2022-06-20 NOTE — ED Provider Notes (Signed)
[facility]    CSN: [id number] Arrival date & time: 06/20/22  1547      History   Chief Complaint No chief complaint on file.   HPI Erin Murray is a 51 y.o. female.   Patient presenting today after a fall onto her backside this afternoon.  States her right leg was somehow under her when she fell so she is having right hip pain and decreased range of motion, severe pain with weightbearing, right knee and lower leg pains.  Denies bruising, swelling, numbness, tingling, loss of range of motion.  So far not taken anything over-the-counter for symptoms.    Past Medical History:  Diagnosis Date   Complicated migraine    includes: visual changes, left sided weakness, confusion, weakness, dizziness   Fibromyalgia    Fibromyalgia    Headache(784.0)    Reflux    Seizures ([place])    TIA (transient ischemic attack) 2010    Patient Active Problem List   Diagnosis Date Noted   Tachycardia 06/04/2022   Bilateral leg paresthesia 06/04/2022   Encounter for general adult medical examination with abnormal findings 06/04/2022   Left upper quadrant abdominal pain 06/04/2022   Sprain of wrist joint, left, subsequent encounter 10/11/2021   Hand trauma, left, subsequent encounter 10/11/2021   Hypertension 02/06/2019   Abnormal uterine bleeding (AUB) 04/01/2018   Reflux    Fibromyalgia    Headache(784.0)    TIA (transient ischemic attack)    Seizures (HCC)    Migraine     Past Surgical History:  Procedure Laterality Date   CHOLECYSTECTOMY     ESOPHAGOGASTRODUODENOSCOPY N/A 10/14/2014   Procedure: ESOPHAGOGASTRODUODENOSCOPY (EGD);  Surgeon: Rogene Houston, MD;  [facility]: [facility];  Service: Endoscopy;  Laterality: N/A;  210 - moved to 12:45 - Ann to notify pt   OVARIAN CYST DRAINAGE     TUBAL LIGATION      OB History   No obstetric history on file.      Home Medications    Prior to Admission medications   Medication Sig Start Date End Date Taking?  Authorizing Provider  cyclobenzaprine (FLEXERIL) 10 MG tablet Take 1 tablet (10 mg total) by mouth 3 (three) times daily as needed for muscle spasms. Do not drink alcohol or drive while taking this medication.  May cause drowsiness. 06/20/22  Yes Volney American, PA-C  naproxen (NAPROSYN) 500 MG tablet Take 1 tablet (500 mg total) by mouth 2 (two) times daily as needed. 06/20/22  Yes Volney American, PA-C  aspirin EC 81 MG tablet Take 81 mg by mouth daily.    [provider]  melatonin 5 MG TABS Take 20 mg by mouth at bedtime as needed.    [provider]  methocarbamol (ROBAXIN) 750 MG tablet Take 1 tablet (750 mg total) by mouth every 8 (eight) hours as needed for muscle spasms. 06/04/22 09/02/22  Johnette Abraham, MD  metoprolol tartrate (LOPRESSOR) 50 MG tablet Take 1 tablet (50 mg total) by mouth 2 (two) times daily. 06/04/22 09/02/22  Johnette Abraham, MD  nitroGLYCERIN (NITROSTAT) 0.4 MG SL tablet Place 1 tablet (0.4 mg total) under the tongue every 5 (five) minutes as needed for chest pain. 08/26/19 06/04/22  Herminio Commons, MD  pantoprazole (PROTONIX) 40 MG tablet Take 1 tablet (40 mg total) by mouth 2 (two) times daily. 06/04/22 09/02/22  Johnette Abraham, MD  rizatriptan (MAXALT-MLT) 10 MG disintegrating tablet Take 1 tablet (10 mg total) by  mouth as needed for migraine. May repeat in 2 hours if needed 06/04/22   Johnette Abraham, MD  topiramate (TOPAMAX) 50 MG tablet Take 2 tablets (100 mg total) by mouth 2 (two) times daily. One po bid xone week, then 2 tabs po bid 06/04/22   Johnette Abraham, MD  vitamin B-12 (CYANOCOBALAMIN) 1000 MCG tablet Take 5,000 mcg by mouth daily.    [provider]    Family History Family History  Problem Relation Age of Onset   High Cholesterol Father    Heart disease Father    High Cholesterol Mother    Asthma Mother    COPD Maternal Grandfather     Social History Social History   Tobacco Use   Smoking  status: Never   Smokeless tobacco: Never  Vaping Use   Vaping Use: Never used  Substance Use Topics   Alcohol use: No   Drug use: No     Allergies   Sulfa antibiotics and Penicillins   Review of Systems Review of Systems PER HPI  Physical Exam Triage Vital Signs ED Triage Vitals  Enc Vitals Group     BP 06/20/22 1605 121/83     Pulse Rate 06/20/22 1605 86     Resp 06/20/22 1605 18     Temp 06/20/22 1605 98 F (36.7 C)     Temp Source 06/20/22 1605 Oral     SpO2 06/20/22 1605 94 %     Weight --      Height --      Head Circumference --      Peak Flow --      Pain Score 06/20/22 1606 8     Pain Loc --      Pain Edu? --      Excl. in [place]? --    No data found.  Updated Vital Signs BP 121/83 (BP Location: Right Arm)   Pulse 86   Temp 98 F (36.7 C) (Oral)   Resp 18   LMP 08/24/2019   SpO2 94%   Visual Acuity Right Eye Distance:   Left Eye Distance:   Bilateral Distance:    Right Eye Near:   Left Eye Near:    Bilateral Near:     Physical Exam Vitals and nursing note reviewed.  Constitutional:      Appearance: Normal appearance. She is not ill-appearing.  HENT:     Head: Atraumatic.  Eyes:     Extraocular Movements: Extraocular movements intact.     Conjunctiva/sclera: Conjunctivae normal.  Cardiovascular:     Rate and Rhythm: Normal rate and regular rhythm.     Heart sounds: Normal heart sounds.  Pulmonary:     Effort: Pulmonary effort is normal.     Breath sounds: Normal breath sounds.  Musculoskeletal:        General: Tenderness and signs of injury present. No swelling or deformity.     Cervical back: Normal range of motion and neck supple.     Comments: Antalgic gait, range of motion exam of the right hip limited due to pain, otherwise range of motion fully intact diffusely.  No midline spinal tenderness to palpation diffusely.  Negative straight leg raise bilaterally.  Mild tenderness to palpation diffusely across right knee without edema,  bruising, deformity  Skin:    General: Skin is warm and dry.  Neurological:     Mental Status: She is alert and oriented to person, place, and time.     Motor: No weakness.  Comments: Bilateral lower extremities neurovascularly intact  Psychiatric:        Mood and Affect: Mood normal.        Thought Content: Thought content normal.        Judgment: Judgment normal.      UC Treatments / Results  Labs (all labs ordered are listed, but only abnormal results are displayed) Labs Reviewed - No data to display  EKG   Radiology DG Hip Unilat With Pelvis 2-3 Views Right  Result Date: 06/20/2022 CLINICAL DATA:  Right hip pain after fall today. EXAM: DG HIP (WITH OR WITHOUT PELVIS) 2-3V RIGHT COMPARISON:  February 07, 2022. FINDINGS: There is no evidence of hip fracture or dislocation. Mild osteophyte formation is seen involving both hips. IMPRESSION: Mild degenerative joint disease of right hip. No acute abnormality seen. Electronically Signed   By: Marijo Conception M.D.   On: 06/20/2022 16:47    Procedures Procedures (including critical care time)  Medications Ordered in UC Medications - No data to display  Initial Impression / Assessment and Plan / UC Course  I have reviewed the triage vital signs and the nursing notes.  Pertinent labs & imaging results that were available during my care of the patient were reviewed by me and considered in my medical decision making (see chart for details).     X-ray of the right hip negative for acute bony abnormality, suspect muscle strain causing her symptoms.  Treat with muscle relaxers, anti-inflammatories, warm Epsom salt baths, stretches, heat, massage.  Work note given.  Return for worsening symptoms.  Final Clinical Impressions(s) / UC Diagnoses   Final diagnoses:  Right hip pain  Acute pain of right knee  Fall, initial encounter   Discharge Instructions   None    ED Prescriptions     Medication Sig Dispense Auth. Provider    cyclobenzaprine (FLEXERIL) 10 MG tablet Take 1 tablet (10 mg total) by mouth 3 (three) times daily as needed for muscle spasms. Do not drink alcohol or drive while taking this medication.  May cause drowsiness. 15 tablet Volney American, [place]   naproxen (NAPROSYN) 500 MG tablet Take 1 tablet (500 mg total) by mouth 2 (two) times daily as needed. 30 tablet Volney American, [place]      PDMP not reviewed this encounter.   Volney American, [place] 06/20/22 1717

## 2022-06-26 ENCOUNTER — Ambulatory Visit (INDEPENDENT_AMBULATORY_CARE_PROVIDER_SITE_OTHER): Payer: 59 | Admitting: Internal Medicine

## 2022-06-26 ENCOUNTER — Encounter: Payer: Self-pay | Admitting: Internal Medicine

## 2022-06-26 VITALS — BP 121/82 | HR 95 | Ht 66.0 in | Wt 228.2 lb

## 2022-06-26 DIAGNOSIS — Z9181 History of falling: Secondary | ICD-10-CM | POA: Insufficient documentation

## 2022-06-26 MED ORDER — MELOXICAM 7.5 MG PO TABS: 7.5000 mg | ORAL_TABLET | Freq: Every day | ORAL | 0 refills | Status: DC

## 2022-06-26 NOTE — Assessment & Plan Note (Addendum)
Recent fall on 12/7 with resulting right hip and knee pain.  No evidence of fracture on prior imaging.  She has prepatellar bruising and mild swelling.  Walks with an antalgic gait due to pain.  I have prescribed meloxicam 7.5 mg daily to be taken over the next 2 weeks.  I recommended she continue additional supportive care measures and ambulate as able.  She should follow-up in 2-4 weeks if her pain is not improving.

## 2022-06-26 NOTE — Patient Instructions (Signed)
It was a pleasure to see you today.  Thank you for giving [place] the opportunity to be involved in your care.  Below is a brief recap of your visit and next steps.  We will plan to see you again in February  Summary I have prescribed meloxicam 7.5 mg daily for pain relief I recommend wearing a compression stocking and walking as able to help with healing.

## 2022-06-26 NOTE — Progress Notes (Signed)
   Acute Office Visit  Subjective:     Patient ID: Erin Murray, female    DOB: [birth year], 51 y.o.   MRN: [id number]  Chief Complaint  Patient presents with   Fall    UC follow up from fall on 06/20/2022. Right leg, hip, and ankle   Patient is in today for urgent care follow-up after recent fall.  She presented to UC on 12/7 after falling onto her backside same day.  She reported that her right leg was caught underneath her when she fell and she endorsed pain in her right hip and knee on presentation.  Imaging of the right lower extremity was negative for fracture.  She was prescribed naproxen and Flexeril for pain relief.  Today she continues to endorse pain in her right hip and knee.  Pain is located on the anterior portion of her right knee and the anterior aspect of her right hip.  Pain is worse with extended periods of standing.  She has been taking naproxen for pain relief.  Review of Systems  Musculoskeletal:  Positive for joint pain (Right hip and knee).  All other systems reviewed and are negative.     Objective:    BP 121/82   Pulse 95   Ht '5\' 6"'$  (1.676 m)   Wt 228 lb 3.2 oz (103.5 kg)   LMP 08/24/2019   SpO2 95%   BMI 36.83 kg/m  BP Readings from Last 3 Encounters:  06/26/22 121/82  06/20/22 121/83  06/04/22 120/78   Physical Exam Musculoskeletal:        General: Swelling (mild prepatellar swelling and ecchymoses), tenderness (TTP right patella) and signs of injury (right hip and knee) present.  Neurological:     Gait: Gait abnormal (antalgic gait).       Assessment & Plan:   Problem List Items Addressed This Visit       History of recent fall - Primary    Recent fall on 12/7 with resulting right hip and knee pain.  No evidence of fracture on prior imaging.  She has prepatellar bruising and mild swelling.  Walks with an antalgic gait due to pain.  I have prescribed meloxicam 7.5 mg daily to be taken over the next 2 weeks.  I recommended she continue  additional supportive care measures and ambulate as able.  She should follow-up in 2-4 weeks if her pain is not improving.       Meds ordered this encounter  Medications   meloxicam (MOBIC) 7.5 MG tablet    Sig: Take 1 tablet (7.5 mg total) by mouth daily.    Dispense:  30 tablet    Refill:  0    Return if symptoms worsen or fail to improve.  Johnette Abraham, MD

## 2022-07-03 ENCOUNTER — Ambulatory Visit: Payer: 59 | Admitting: Gastroenterology

## 2022-07-20 ENCOUNTER — Other Ambulatory Visit: Payer: Self-pay | Admitting: Internal Medicine

## 2022-07-20 DIAGNOSIS — Z9181 History of falling: Secondary | ICD-10-CM

## 2022-07-23 ENCOUNTER — Ambulatory Visit
Admission: EM | Admit: 2022-07-23 | Discharge: 2022-07-23 | Disposition: A | Discharge status: Home / Self Care | Payer: 59 | Attending: Family Medicine | Admitting: Family Medicine

## 2022-07-23 DIAGNOSIS — M797 Fibromyalgia: Secondary | ICD-10-CM | POA: Insufficient documentation

## 2022-07-23 DIAGNOSIS — R509 Fever, unspecified: Secondary | ICD-10-CM | POA: Diagnosis not present

## 2022-07-23 DIAGNOSIS — Z8673 Personal history of transient ischemic attack (TIA), and cerebral infarction without residual deficits: Secondary | ICD-10-CM | POA: Diagnosis not present

## 2022-07-23 DIAGNOSIS — Z1152 Encounter for screening for COVID-19: Secondary | ICD-10-CM | POA: Insufficient documentation

## 2022-07-23 DIAGNOSIS — J069 Acute upper respiratory infection, unspecified: Secondary | ICD-10-CM | POA: Diagnosis not present

## 2022-07-23 DIAGNOSIS — Z79899 Other long term (current) drug therapy: Secondary | ICD-10-CM | POA: Insufficient documentation

## 2022-07-23 DIAGNOSIS — R058 Other specified cough: Secondary | ICD-10-CM | POA: Insufficient documentation

## 2022-07-23 MED ORDER — FLUTICASONE PROPIONATE 50 MCG/ACT NA SUSP: 1.0000 | Freq: Two times a day (BID) | NASAL | 2 refills | Status: AC

## 2022-07-23 MED ORDER — PROMETHAZINE-DM 6.25-15 MG/5ML PO SYRP: 5.0000 mL | ORAL_SOLUTION | Freq: Four times a day (QID) | ORAL | 0 refills | Status: DC | PRN

## 2022-07-23 NOTE — ED Provider Notes (Signed)
[facility]    CSN: [id number] Arrival date & time: 07/23/22  1711      History   Chief Complaint No chief complaint on file.   HPI Erin Murray is a 52 y.o. female.   Presenting today with 1 day history of headache, congestion, fever, cough, body aches, fatigue, sore throat.  Denies chest pain, shortness of breath, abdominal pain, nausea vomiting or diarrhea.  Taking Tylenol Cold and flu with mild relief.  States she just got over the flu 2 weeks ago.  Multiple family members sick with similar symptoms.    Past Medical History:  Diagnosis Date   Complicated migraine    includes: visual changes, left sided weakness, confusion, weakness, dizziness   Fibromyalgia    Fibromyalgia    Headache(784.0)    Reflux    Seizures ([place])    TIA (transient ischemic attack) 2010    Patient Active Problem List   Diagnosis Date Noted   History of recent fall 06/26/2022   Tachycardia 06/04/2022   Bilateral leg paresthesia 06/04/2022   Encounter for general adult medical examination with abnormal findings 06/04/2022   Left upper quadrant abdominal pain 06/04/2022   Sprain of wrist joint, left, subsequent encounter 10/11/2021   Hand trauma, left, subsequent encounter 10/11/2021   Hypertension 02/06/2019   Abnormal uterine bleeding (AUB) 04/01/2018   Reflux    Fibromyalgia    Headache(784.0)    TIA (transient ischemic attack)    Seizures ([facility])    Migraine     Past Surgical History:  Procedure Laterality Date   CHOLECYSTECTOMY     ESOPHAGOGASTRODUODENOSCOPY N/A 10/14/2014   Procedure: ESOPHAGOGASTRODUODENOSCOPY (EGD);  Surgeon: Rogene Houston, MD;  [facility]: [facility];  Service: Endoscopy;  Laterality: N/A;  210 - moved to 12:45 - Ann to notify pt   OVARIAN CYST DRAINAGE     TUBAL LIGATION      OB History   No obstetric history on file.      Home Medications    Prior to Admission medications   Medication Sig Start Date End Date Taking? Authorizing  Provider  fluticasone (FLONASE) 50 MCG/ACT nasal spray Place 1 spray into both nostrils 2 (two) times daily. 07/23/22  Yes Volney American, PA-C  promethazine-dextromethorphan (PROMETHAZINE-DM) 6.25-15 MG/5ML syrup Take 5 mLs by mouth 4 (four) times daily as needed. 07/23/22  Yes Volney American, PA-C  aspirin EC 81 MG tablet Take 81 mg by mouth daily.    [provider]  cyclobenzaprine (FLEXERIL) 10 MG tablet Take 1 tablet (10 mg total) by mouth 3 (three) times daily as needed for muscle spasms. Do not drink alcohol or drive while taking this medication.  May cause drowsiness. 06/20/22   Volney American, PA-C  melatonin 5 MG TABS Take 20 mg by mouth at bedtime as needed.    [provider]  meloxicam (MOBIC) 7.5 MG tablet Take 1 tablet by mouth once daily 07/22/22   Johnette Abraham, MD  methocarbamol (ROBAXIN) 750 MG tablet Take 1 tablet (750 mg total) by mouth every 8 (eight) hours as needed for muscle spasms. 06/04/22 09/02/22  Johnette Abraham, MD  metoprolol tartrate (LOPRESSOR) 50 MG tablet Take 1 tablet (50 mg total) by mouth 2 (two) times daily. 06/04/22 09/02/22  Johnette Abraham, MD  nitroGLYCERIN (NITROSTAT) 0.4 MG SL tablet Place 1 tablet (0.4 mg total) under the tongue every 5 (five) minutes as needed for chest pain. 08/26/19 06/04/22  Herminio Commons,  MD  pantoprazole (PROTONIX) 40 MG tablet Take 1 tablet (40 mg total) by mouth 2 (two) times daily. 06/04/22 09/02/22  Johnette Abraham, MD  rizatriptan (MAXALT-MLT) 10 MG disintegrating tablet Take 1 tablet (10 mg total) by mouth as needed for migraine. May repeat in 2 hours if needed 06/04/22   Johnette Abraham, MD  topiramate (TOPAMAX) 50 MG tablet Take 2 tablets (100 mg total) by mouth 2 (two) times daily. One po bid xone week, then 2 tabs po bid 06/04/22   Johnette Abraham, MD  vitamin B-12 (CYANOCOBALAMIN) 1000 MCG tablet Take 5,000 mcg by mouth daily.    [provider]    Family  History Family History  Problem Relation Age of Onset   High Cholesterol Father    Heart disease Father    High Cholesterol Mother    Asthma Mother    COPD Maternal Grandfather     Social History Social History   Tobacco Use   Smoking status: Never   Smokeless tobacco: Never  Vaping Use   Vaping Use: Never used  Substance Use Topics   Alcohol use: No   Drug use: No     Allergies   Sulfa antibiotics and Penicillins   Review of Systems Review of Systems Per HPI  Physical Exam Triage Vital Signs ED Triage Vitals  Enc Vitals Group     BP 07/23/22 1718 125/85     Pulse Rate 07/23/22 1718 (!) 102     Resp 07/23/22 1718 20     Temp 07/23/22 1718 97.8 F (36.6 C)     Temp Source 07/23/22 1718 Oral     SpO2 07/23/22 1718 95 %     Weight --      Height --      Head Circumference --      Peak Flow --      Pain Score 07/23/22 1723 4     Pain Loc --      Pain Edu? --      Excl. in [place]? --    No data found.  Updated Vital Signs BP 125/85 (BP Location: Right Arm)   Pulse (!) 102   Temp 97.8 F (36.6 C) (Oral)   Resp 20   LMP 08/24/2019   SpO2 95%   Visual Acuity Right Eye Distance:   Left Eye Distance:   Bilateral Distance:    Right Eye Near:   Left Eye Near:    Bilateral Near:     Physical Exam Vitals and nursing note reviewed.  Constitutional:      Appearance: Normal appearance. She is not ill-appearing.  HENT:     Head: Atraumatic.     Right Ear: Tympanic membrane and external ear normal.     Left Ear: Tympanic membrane and external ear normal.     Nose: Rhinorrhea present.     Mouth/Throat:     Mouth: Mucous membranes are moist.     Pharynx: Posterior oropharyngeal erythema present.  Eyes:     Extraocular Movements: Extraocular movements intact.     Conjunctiva/sclera: Conjunctivae normal.  Cardiovascular:     Rate and Rhythm: Normal rate and regular rhythm.     Heart sounds: Normal heart sounds.  Pulmonary:     Effort: Pulmonary effort  is normal.     Breath sounds: Normal breath sounds. No wheezing or rales.  Musculoskeletal:        General: Normal range of motion.     Cervical back: Normal range  of motion and neck supple.  Skin:    General: Skin is warm and dry.  Neurological:     Mental Status: She is alert and oriented to person, place, and time.  Psychiatric:        Mood and Affect: Mood normal.        Thought Content: Thought content normal.        Judgment: Judgment normal.      UC Treatments / Results  Labs (all labs ordered are listed, but only abnormal results are displayed) Labs Reviewed  SARS CORONAVIRUS 2 (TAT 6-24 HRS)    EKG   Radiology No results found.  Procedures Procedures (including critical care time)  Medications Ordered in UC Medications - No data to display  Initial Impression / Assessment and Plan / UC Course  I have reviewed the triage vital signs and the nursing notes.  Pertinent labs & imaging results that were available during my care of the patient were reviewed by me and considered in my medical decision making (see chart for details).     Minimally tachycardic in triage, otherwise vital signs reassuring.  Suspect viral upper respiratory infection, possibly COVID-19.  COVID testing pending, treat with Phenergan DM, Flonase, supportive over-the-counter medications and home care.  Work note given.  Return for worsening symptoms.  Final Clinical Impressions(s) / UC Diagnoses   Final diagnoses:  Viral URI with cough  Fever, unspecified   Discharge Instructions   None    ED Prescriptions     Medication Sig Dispense Auth. Provider   promethazine-dextromethorphan (PROMETHAZINE-DM) 6.25-15 MG/5ML syrup Take 5 mLs by mouth 4 (four) times daily as needed. 100 mL Volney American, PA-C   fluticasone [facility]) 50 MCG/ACT nasal spray Place 1 spray into both nostrils 2 (two) times daily. 16 g Volney American, [place]      PDMP not reviewed this encounter.    Volney American, [place] 07/23/22 1756

## 2022-07-23 NOTE — ED Triage Notes (Signed)
Pt reports she was flu positive x 2 weeks ago, and now  pt reports she is sick again. Headache, nasal congestion, fever,  cough, neck pain, and fatigue. Took tylenol cold and flu which gave slight relief.

## 2022-07-24 LAB — SARS CORONAVIRUS 2 (TAT 6-24 HRS): SARS Coronavirus 2: NEGATIVE

## 2022-07-29 ENCOUNTER — Encounter: Payer: Self-pay | Admitting: Internal Medicine

## 2022-07-29 ENCOUNTER — Telehealth (INDEPENDENT_AMBULATORY_CARE_PROVIDER_SITE_OTHER): Payer: 59 | Admitting: Internal Medicine

## 2022-07-29 DIAGNOSIS — M25561 Pain in right knee: Secondary | ICD-10-CM | POA: Diagnosis not present

## 2022-07-29 DIAGNOSIS — Z9181 History of falling: Secondary | ICD-10-CM | POA: Diagnosis not present

## 2022-07-29 DIAGNOSIS — M25551 Pain in right hip: Secondary | ICD-10-CM | POA: Diagnosis not present

## 2022-07-29 NOTE — Assessment & Plan Note (Signed)
Recent fall on 12/7 with resulting right hip and knee pain.  She presented to the emergency department at that time.  Imaging was negative for fracture.  She has been managing her pain with NSAIDs.  Overall her pain has slightly improved, but largely persists.  Pain is particularly worse with sitting and flexion at the waist.  While imaging was negative for fracture, it was significant for mild degenerative changes of the right hip. -We reviewed additional treatment options today.  I recommended physical therapy given the persistence of her pain with underlying degenerative changes noted in the right hip.  I also offered to place referral to orthopedic surgery for evaluation.  At this time, Erin Murray would like to continue with as needed use of meloxicam.  If her pain does not improve over the next month she will contact our office for follow-up.

## 2022-07-29 NOTE — Progress Notes (Signed)
Virtual Visit via Video Note  I connected with Erin Murray on 07/29/22 at  3:40 PM EST by a video enabled telemedicine application and verified that I am speaking with the correct person using two identifiers.  Location: Patient: [address], [place], [place] [address] Provider: [phone] S. [address], [place], [place] [address]  I discussed the limitations, risks, security, and privacy concerns of performing an evaluation and management service by video and the availability of in person appointments. I also discussed with the patient that there may be a patient responsible charge related to this service. The patient expressed understanding and agreed to proceed.  Subjective: PCP: Johnette Abraham, MD  Chief Complaint  Patient presents with   Hip Pain    Pain in hip, knee, ankle, and foot from recent fall 06/20/22.    Erin Murray has been evaluated today for an acute visit via video encounter for persistent right hip and knee pain.  She fell backwards on 12/7, landing on her right hip and knee.  She presented to the emergency department for evaluation at that time.  Imaging was negative for acute pathology or fracture.  She was treated with anti-inflammatories and muscle relaxers and released home.  Erin Murray then followed up with me on 12/13.  I prescribed meloxicam 7.5 mg daily.  Today she states that her right hip and knee pain has persisted.  It has somewhat improved, but she experiences significant pain along the lateral and anterior aspects of the right hip, particularly with flexion at the waist and sitting.  She additionally endorses pain along the lateral aspect of the right knee that radiates distally.  She has been taking meloxicam as needed for pain relief.  She is interested in additional treatment options today.  ROS: Per HPI  Current Outpatient Medications:    aspirin EC 81 MG tablet, Take 81 mg by mouth daily., Disp: , Rfl:    cyclobenzaprine (FLEXERIL) 10 MG tablet, Take 1 tablet (10 mg  total) by mouth 3 (three) times daily as needed for muscle spasms. Do not drink alcohol or drive while taking this medication.  May cause drowsiness., Disp: 15 tablet, Rfl: 0   fluticasone (FLONASE) 50 MCG/ACT nasal spray, Place 1 spray into both nostrils 2 (two) times daily., Disp: 16 g, Rfl: 2   melatonin 5 MG TABS, Take 20 mg by mouth at bedtime as needed., Disp: , Rfl:    meloxicam (MOBIC) 7.5 MG tablet, Take 1 tablet by mouth once daily, Disp: 30 tablet, Rfl: 0   methocarbamol (ROBAXIN) 750 MG tablet, Take 1 tablet (750 mg total) by mouth every 8 (eight) hours as needed for muscle spasms., Disp: 90 tablet, Rfl: 2   metoprolol tartrate (LOPRESSOR) 50 MG tablet, Take 1 tablet (50 mg total) by mouth 2 (two) times daily., Disp: 60 tablet, Rfl: 2   nitroGLYCERIN (NITROSTAT) 0.4 MG SL tablet, Place 1 tablet (0.4 mg total) under the tongue every 5 (five) minutes as needed for chest pain., Disp: 25 tablet, Rfl: 3   pantoprazole (PROTONIX) 40 MG tablet, Take 1 tablet (40 mg total) by mouth 2 (two) times daily., Disp: 60 tablet, Rfl: 2   promethazine-dextromethorphan (PROMETHAZINE-DM) 6.25-15 MG/5ML syrup, Take 5 mLs by mouth 4 (four) times daily as needed., Disp: 100 mL, Rfl: 0   rizatriptan (MAXALT-MLT) 10 MG disintegrating tablet, Take 1 tablet (10 mg total) by mouth as needed for migraine. May repeat in 2 hours if needed, Disp: 15 tablet, Rfl: 6  topiramate (TOPAMAX) 50 MG tablet, Take 2 tablets (100 mg total) by mouth 2 (two) times daily. One po bid xone week, then 2 tabs po bid, Disp: 120 tablet, Rfl: 12   vitamin B-12 (CYANOCOBALAMIN) 1000 MCG tablet, Take 5,000 mcg by mouth daily., Disp: , Rfl:   Assessment and Plan: History of recent fall Assessment & Plan: Recent fall on 12/7 with resulting right hip and knee pain.  She presented to the emergency department at that time.  Imaging was negative for fracture.  She has been managing her pain with NSAIDs.  Overall her pain has slightly improved, but  largely persists.  Pain is particularly worse with sitting and flexion at the waist.  While imaging was negative for fracture, it was significant for mild degenerative changes of the right hip. -We reviewed additional treatment options today.  I recommended physical therapy given the persistence of her pain with underlying degenerative changes noted in the right hip.  I also offered to place referral to orthopedic surgery for evaluation.  At this time, Erin Murray would like to continue with as needed use of meloxicam.  If her pain does not improve over the next month she will contact our office for follow-up.  Follow Up Instructions: Return if symptoms worsen or fail to improve.   I discussed the assessment and treatment plan with the patient. The patient was provided an opportunity to ask questions, and all were answered. The patient agreed with the plan and demonstrated an understanding of the instructions.   The patient was advised to call back or seek an in-person evaluation if the symptoms worsen or if the condition fails to improve as anticipated.  The above assessment and management plan was discussed with the patient. The patient verbalized understanding of and has agreed to the management plan.   Johnette Abraham, MD

## 2022-08-07 NOTE — H&P (View-Only) (Signed)
GI Office Note    Referring Provider: Johnette Abraham, MD Primary Care Physician:  Johnette Abraham, MD  Primary Gastroenterologist: Elon Alas. Abbey Chatters, DO  Chief Complaint   Chief Complaint  Patient presents with   Abdominal Pain    Pain in left upper quadrant after eating. Has pain between shoulders after eating at times. Gallbladder removed 2016 per patient. Started over a year ago. Pain happens every time she eats or drinks.     History of Present Illness   Erin Murray is a 52 y.o. female presenting today at the request of Johnette Abraham, MD for screening colonoscopy and LUQ pain.   Colonoscopy 2011: -normal colonoscopy -advised repeat in 5 years  CT A/P with contrast 02/17/2022: -Stable mild biliary prominence status post cholecystectomy -Normal pancreas, spleen, adrenal glands -No bowel wall thickening or inflammatory changes -Small uterine fundal fibroid no adnexal mass  Labs 06/04/22: Alk phos 150, normal LFTs, CBC unremarkable. A1c 6.4. Triglycerides 157, LDL 123, HDL 35. TSH wnl.   Today: LUQ pain: Has abdominal pain in her LUQ with eating but has a good appetite and does not regularly stop her from eating but makes herself eat. Happens with everything she eats and drinks. Sometimes has pani between her shoulder blades. Sometimes it is sharp but most time it is dull/achy and will just hold her stomach. Occurs more so under the breast. Taking pantoprazole 40 mg BID. Used to take carafate prior to eating and that kind of helped but insurance stopped taking it. Has had a cardiac workup that was negative. Had gallbladder removed in 2016 (sister in law passed of gallbladder cancer at that time). No N/V. No unintentional weight loss.   Bowel habits: Has fluctuating constipation and diarrhea. Gets diarrhea about once every 6 weeks, sometimes sooner. Sometimes 1-2 and sometimes she has more than that. At times may have random lower abdominal pain but after she used the  bathroom she has improvement.Sometimes ins constipated.  Just switched PCP's. Dr. in [place] wanted her to see GI due to possible IBS-C. No melena. Has seem rate brbrp. Possibly hemorrhoids. Sometimes has to strain to have a BM.   Dad passed of colon cancer, sister had colon cancer (took out the cancer).  Has an unnamed neurological disorder since birth. Has tremors and takes robaxin and flexeril as well as gabapentin for neuropathy.   Has had 3 TIAs - no blood thinners other than aspirin.   Current Outpatient Medications  Medication Sig Dispense Refill   aspirin EC 81 MG tablet Take 81 mg by mouth daily.     cyclobenzaprine (FLEXERIL) 10 MG tablet Take 1 tablet (10 mg total) by mouth 3 (three) times daily as needed for muscle spasms. Do not drink alcohol or drive while taking this medication.  May cause drowsiness. 15 tablet 0   fluticasone (FLONASE) 50 MCG/ACT nasal spray Place 1 spray into both nostrils 2 (two) times daily. 16 g 2   melatonin 5 MG TABS Take 20 mg by mouth at bedtime as needed.     meloxicam (MOBIC) 7.5 MG tablet Take 1 tablet by mouth once daily 30 tablet 0   methocarbamol (ROBAXIN) 750 MG tablet Take 1 tablet (750 mg total) by mouth every 8 (eight) hours as needed for muscle spasms. 90 tablet 2   metoprolol tartrate (LOPRESSOR) 50 MG tablet Take 1 tablet (50 mg total) by mouth 2 (two) times daily. 60 tablet 2   nitroGLYCERIN (NITROSTAT) 0.4 MG SL  tablet Place 1 tablet (0.4 mg total) under the tongue every 5 (five) minutes as needed for chest pain. 25 tablet 3   pantoprazole (PROTONIX) 40 MG tablet Take 1 tablet (40 mg total) by mouth 2 (two) times daily. 60 tablet 2   rizatriptan (MAXALT-MLT) 10 MG disintegrating tablet Take 1 tablet (10 mg total) by mouth as needed for migraine. May repeat in 2 hours if needed 15 tablet 6   topiramate (TOPAMAX) 50 MG tablet Take 2 tablets (100 mg total) by mouth 2 (two) times daily. One po bid xone week, then 2 tabs po bid 120 tablet 12    vitamin B-12 (CYANOCOBALAMIN) 1000 MCG tablet Take 5,000 mcg by mouth daily.     No current facility-administered medications for this visit.    Past Medical History:  Diagnosis Date   Complicated migraine    includes: visual changes, left sided weakness, confusion, weakness, dizziness   Fibromyalgia    Fibromyalgia    Headache(784.0)    Reflux    Seizures ([facility])    TIA (transient ischemic attack) 2010    Past Surgical History:  Procedure Laterality Date   CHOLECYSTECTOMY     COLONOSCOPY     2015 per patient. done in [place], [place].   ESOPHAGOGASTRODUODENOSCOPY N/A 10/14/2014   Procedure: ESOPHAGOGASTRODUODENOSCOPY (EGD);  Surgeon: Rogene Houston, MD;  [facility];  Service: Endoscopy;  Laterality: N/A;  210 - moved to 12:45 - Ann to notify pt   OVARIAN CYST DRAINAGE     TUBAL LIGATION      Family History  Problem Relation Age of Onset   High Cholesterol Father    Heart disease Father    High Cholesterol Mother    Asthma Mother    COPD Maternal Grandfather     Allergies as of 08/08/2022 - Review Complete 08/08/2022  Allergen Reaction Noted   Sulfa antibiotics Nausea And Vomiting and Other (See Comments) 02/20/2012   Penicillins Hives 02/20/2012    Social History   Socioeconomic History   Marital status: Married    Spouse name: [name]   Number of children: 3   Years of education: Not on file   Highest education level: Not on file  Occupational History   Occupation: day care  Tobacco Use   Smoking status: Never    Passive exposure: Never   Smokeless tobacco: Never  Vaping Use   Vaping Use: Never used  Substance and Sexual Activity   Alcohol use: No   Drug use: No   Sexual activity: Yes    Birth control/protection: Surgical  Other Topics Concern   Not on file  Social History Narrative   Patient lives at home with her husband [name]), three children. Patient works at at a day care. Patient has three children. 43 years old.   Left handed.   Caffeine  two cups of coffee daily   Social Determinants of Health   Financial Resource Strain: Not on file  Food Insecurity: Not on file  Transportation Needs: Not on file  Physical Activity: Not on file  Stress: Not on file  Social Connections: Not on file  Intimate Partner Violence: Not on file     Review of Systems   Gen: Denies any fever, chills, fatigue, weight loss, lack of appetite.  CV: Denies chest pain, heart palpitations, peripheral edema, syncope.  Resp: Denies shortness of breath at rest or with exertion. Denies wheezing or cough.  GI: see HPI GU : Denies urinary burning, urinary frequency, urinary  hesitancy MS: Denies joint pain, muscle weakness, cramps, or limitation of movement.  Derm: Denies rash, itching, dry skin Psych: Denies depression, anxiety, memory loss, and confusion Heme: Denies bruising, bleeding, and enlarged lymph nodes.   Physical Exam   BP 115/76 (BP Location: Left Arm, Patient Position: Sitting, Cuff Size: Large)   Pulse (!) 57   Temp 98.1 F (36.7 C) (Oral)   Ht '5\' 6"'$  (1.676 m)   Wt 226 lb 1.6 oz (102.6 kg)   LMP 08/24/2019   BMI 36.49 kg/m   General:   Alert and oriented. Pleasant and cooperative. Well-nourished and well-developed.  Head:  Normocephalic and atraumatic. Eyes:  Without icterus, sclera clear and conjunctiva pink.  Ears:  Normal auditory acuity. Mouth:  No deformity or lesions, oral mucosa pink.  Lungs:  Clear to auscultation bilaterally. No wheezes, rales, or rhonchi. No distress.  Heart:  S1, S2 present without murmurs appreciated.  Abdomen:  +BS, soft, non-tender non-distended. No HSM noted. No guarding or rebound. No masses appreciated.  Rectal:  Deferred  Msk:  Symmetrical without gross deformities. Normal posture. Extremities:  Without edema. Neurologic:  Alert and  oriented x4;  grossly normal neurologically. Skin:  Intact without significant lesions or rashes. Psych:  Alert and cooperative. Normal mood and  affect.   Assessment   CHAYSE GRACEY is a 52 y.o. female with a history of reflux, seizures, TIA, fibromyalgia presenting today for consult regarding LUQ pain and need for screening colonoscopy.  Family history of colon cancer: Reports history of colon cancer in her father as well as her sister.  Last colonoscopy was in 2011.  5-year repeat recommended.  Currently overdue.  Has had some mild BRBPR likely related to hemorrhoids as it is usually self-limiting.  Will proceed with primary screening colonoscopy at time of upper endoscopy.  LUQ pain: Has left upper quadrant pain with any p.o. intake including solids and liquids.  Occasionally has pain between her shoulder blades as well.  She continues to eat despite pain and has a good appetite.  Currently taking pantoprazole 40 mg twice daily.  Used to take Carafate which helped prior to eating however insurance would not pay for it cirrhosis continued.  Reports pain is similar to when she had gallbladder pain years ago however her gallbladder was removed in 2016.  Denies any nausea, vomiting, dysphagia, or unintentional weight loss.  Given ongoing going abdominal pain with p.o. intake we will proceed with upper endoscopy for further evaluation of gastritis, duodenitis, peptic ulcer disease, etc.  Constipation: Has fluctuating constipation and diarrhea however diarrhea only covers about once every 6 weeks or so.  Suspect diarrhea is likely overflow in the setting of constipation.  Possible IBS component given occasional lower abdominal pain.  Currently not on any regimen.  Advised to begin daily fiber supplement as well as MiraLAX every 2-3 days as needed.  PLAN   Proceed with colonoscopy and upper endoscopy with propofol by Dr. Abbey Chatters  in near future: the risks, benefits, and alternatives have been discussed with the patient in detail. The patient states understanding and desires to proceed. ASA 3 Extra half day of clear liquids Continue pantoprazole  40 mg twice daily GERD diet/lifestyle modifications May use famotidine 20 mg once or twice daily as needed for LUQ pain Begin a daily fiber supplement such as Benefiber, 2-3 teaspoons daily. May start taking MiraLAX 1 capful every 2-3 days as needed. Follow-up 6-8 weeks after procedures   Venetia Night, MSN, FNP-BC, AGACNP-BC [facility]

## 2022-08-07 NOTE — Progress Notes (Unsigned)
GI Office Note    Referring Provider: Billie Lade, MD Primary Care Physician:  Billie Lade, MD  Primary Gastroenterologist: Hennie Duos. Marletta Lor, DO  Chief Complaint   No chief complaint on file.    History of Present Illness   SICLALY HANISCH is a 52 y.o. female presenting today at the request of Billie Lade, MD for ***screening colonoscopy and LUQ pain.   Colonoscopy 2011: -normal colonoscopy -advised repeat in 5 years  Labs 06/04/22: Alk phos 150, normal LFTs, CBC unremarkable. A1c 6.4. Triglycerides 157, LDL 123, HDL 35. TSH wnl.   Today: LUQ pain: Taking pantoprazole 40 mg BID.   Bowel habits:   Current Outpatient Medications  Medication Sig Dispense Refill   aspirin EC 81 MG tablet Take 81 mg by mouth daily.     cyclobenzaprine (FLEXERIL) 10 MG tablet Take 1 tablet (10 mg total) by mouth 3 (three) times daily as needed for muscle spasms. Do not drink alcohol or drive while taking this medication.  May cause drowsiness. 15 tablet 0   fluticasone (FLONASE) 50 MCG/ACT nasal spray Place 1 spray into both nostrils 2 (two) times daily. 16 g 2   melatonin 5 MG TABS Take 20 mg by mouth at bedtime as needed.     meloxicam (MOBIC) 7.5 MG tablet Take 1 tablet by mouth once daily 30 tablet 0   methocarbamol (ROBAXIN) 750 MG tablet Take 1 tablet (750 mg total) by mouth every 8 (eight) hours as needed for muscle spasms. 90 tablet 2   metoprolol tartrate (LOPRESSOR) 50 MG tablet Take 1 tablet (50 mg total) by mouth 2 (two) times daily. 60 tablet 2   nitroGLYCERIN (NITROSTAT) 0.4 MG SL tablet Place 1 tablet (0.4 mg total) under the tongue every 5 (five) minutes as needed for chest pain. 25 tablet 3   pantoprazole (PROTONIX) 40 MG tablet Take 1 tablet (40 mg total) by mouth 2 (two) times daily. 60 tablet 2   promethazine-dextromethorphan (PROMETHAZINE-DM) 6.25-15 MG/5ML syrup Take 5 mLs by mouth 4 (four) times daily as needed. 100 mL 0   rizatriptan (MAXALT-MLT) 10 MG  disintegrating tablet Take 1 tablet (10 mg total) by mouth as needed for migraine. May repeat in 2 hours if needed 15 tablet 6   topiramate (TOPAMAX) 50 MG tablet Take 2 tablets (100 mg total) by mouth 2 (two) times daily. One po bid xone week, then 2 tabs po bid 120 tablet 12   vitamin B-12 (CYANOCOBALAMIN) 1000 MCG tablet Take 5,000 mcg by mouth daily.     No current facility-administered medications for this visit.    Past Medical History:  Diagnosis Date   Complicated migraine    includes: visual changes, left sided weakness, confusion, weakness, dizziness   Fibromyalgia    Fibromyalgia    Headache(784.0)    Reflux    Seizures ([facility])    TIA (transient ischemic attack) 2010    Past Surgical History:  Procedure Laterality Date   CHOLECYSTECTOMY     ESOPHAGOGASTRODUODENOSCOPY N/A 10/14/2014   Procedure: ESOPHAGOGASTRODUODENOSCOPY (EGD);  Surgeon: Malissa Hippo, MD;  Location[facility];  Service: Endoscopy;  Laterality: N/A;  210 - moved to 12:45 - Ann to notify pt   OVARIAN CYST DRAINAGE     TUBAL LIGATION      Family History  Problem Relation Age of Onset   High Cholesterol Father    Heart disease Father    High Cholesterol Mother    Asthma Mother  COPD Maternal Grandfather     Allergies as of 08/08/2022 - Review Complete 07/29/2022  Allergen Reaction Noted   Sulfa antibiotics Nausea And Vomiting and Other (See Comments) 02/20/2012   Penicillins Hives 02/20/2012    Social History   Socioeconomic History   Marital status: Married    Spouse name: Dulce Sellar   Number of children: 3   Years of education: Not on file   Highest education level: Not on file  Occupational History   Occupation: day care  Tobacco Use   Smoking status: Never   Smokeless tobacco: Never  Vaping Use   Vaping Use: Never used  Substance and Sexual Activity   Alcohol use: No   Drug use: No   Sexual activity: Yes    Birth control/protection: Surgical  Other Topics Concern   Not on  file  Social History Narrative   Patient lives at home with her husband Dulce Sellar), three children. Patient works at at a day care. Patient has three children. 49 years old.   Left handed.   Caffeine two cups of coffee daily   Social Determinants of Health   Financial Resource Strain: Not on file  Food Insecurity: Not on file  Transportation Needs: Not on file  Physical Activity: Not on file  Stress: Not on file  Social Connections: Not on file  Intimate Partner Violence: Not on file     Review of Systems   Gen: Denies any fever, chills, fatigue, weight loss, lack of appetite.  CV: Denies chest pain, heart palpitations, peripheral edema, syncope.  Resp: Denies shortness of breath at rest or with exertion. Denies wheezing or cough.  GI: see HPI GU : Denies urinary burning, urinary frequency, urinary hesitancy MS: Denies joint pain, muscle weakness, cramps, or limitation of movement.  Derm: Denies rash, itching, dry skin Psych: Denies depression, anxiety, memory loss, and confusion Heme: Denies bruising, bleeding, and enlarged lymph nodes.   Physical Exam   LMP 08/24/2019   General:   Alert and oriented. Pleasant and cooperative. Well-nourished and well-developed.  Head:  Normocephalic and atraumatic. Eyes:  Without icterus, sclera clear and conjunctiva pink.  Ears:  Normal auditory acuity. Mouth:  No deformity or lesions, oral mucosa pink.  Lungs:  Clear to auscultation bilaterally. No wheezes, rales, or rhonchi. No distress.  Heart:  S1, S2 present without murmurs appreciated.  Abdomen:  +BS, soft, non-tender and non-distended. No HSM noted. No guarding or rebound. No masses appreciated.  Rectal:  Deferred *** Msk:  Symmetrical without gross deformities. Normal posture. Extremities:  Without edema. Neurologic:  Alert and  oriented x4;  grossly normal neurologically. Skin:  Intact without significant lesions or rashes. Psych:  Alert and cooperative. Normal mood and  affect.   Assessment   TYRINA DORAME is a 52 y.o. female with a history of reflux, seizures, TIA, fibromyalgia*** presenting today for consult regarding LUQ pain and need for screening colonoscopy.  Family history of colon polyps:  LUQ pain:   PLAN   *** TCS?   Brooke Bonito, MSN, FNP-BC, AGACNP-BC [facility]

## 2022-08-08 ENCOUNTER — Encounter (INDEPENDENT_AMBULATORY_CARE_PROVIDER_SITE_OTHER): Payer: Self-pay | Admitting: *Deleted

## 2022-08-08 ENCOUNTER — Encounter: Payer: Self-pay | Admitting: Gastroenterology

## 2022-08-08 ENCOUNTER — Ambulatory Visit (INDEPENDENT_AMBULATORY_CARE_PROVIDER_SITE_OTHER): Payer: 59 | Admitting: Gastroenterology

## 2022-08-08 VITALS — BP 115/76 | HR 57 | Temp 98.1°F | Ht 66.0 in | Wt 226.1 lb

## 2022-08-08 DIAGNOSIS — R1012 Left upper quadrant pain: Secondary | ICD-10-CM | POA: Diagnosis not present

## 2022-08-08 DIAGNOSIS — K5900 Constipation, unspecified: Secondary | ICD-10-CM | POA: Diagnosis not present

## 2022-08-08 DIAGNOSIS — K219 Gastro-esophageal reflux disease without esophagitis: Secondary | ICD-10-CM | POA: Diagnosis not present

## 2022-08-08 DIAGNOSIS — Z800 Family history of malignant neoplasm of digestive organs: Secondary | ICD-10-CM

## 2022-08-08 NOTE — Patient Instructions (Signed)
We are scheduling you for a colonoscopy and upper endoscopy in the near future with Dr. Abbey Chatters.  You will receive separate detailed written instructions regarding your prep.  Continue to take pantoprazole 40 mg twice daily. You may use famotidine 20 mg once or twice daily as needed for breakthrough symptoms are left upper quadrant pain (may take at lunch time and additional dose at bedtime if needed)  For constipation you should start a daily fiber supplement such as Benefiber.  Start with 2-3 teaspoons daily. You may also use MiraLAX 1 capful every 2-3 days as needed, and increase frequency as needed.  Hold in the setting of multiple loose stools.  Will see you for follow-up 6-8 weeks after your procedures to reassess your left upper quadrant pain and your constipation.  It was a pleasure to see you today. I want to create trusting relationships with patients. If you receive a survey regarding your visit,  I greatly appreciate you taking time to fill this out on paper or through your MyChart. I value your feedback.  Venetia Night, MSN, FNP-BC, AGACNP-BC [facility]

## 2022-08-09 ENCOUNTER — Encounter: Payer: Self-pay | Admitting: *Deleted

## 2022-08-15 ENCOUNTER — Encounter (HOSPITAL_COMMUNITY)
Admission: RE | Admit: 2022-08-15 | Discharge: 2022-08-15 | Disposition: A | Discharge status: Home / Self Care | Payer: 59 | Source: Ambulatory Visit | Attending: Internal Medicine | Admitting: Internal Medicine

## 2022-08-15 ENCOUNTER — Encounter (HOSPITAL_COMMUNITY): Payer: Self-pay

## 2022-08-15 HISTORY — DX: Gastro-esophageal reflux disease without esophagitis: K21.9

## 2022-08-15 HISTORY — DX: Cardiac arrhythmia, unspecified: I49.9

## 2022-08-15 HISTORY — DX: Essential (primary) hypertension: I10

## 2022-08-15 HISTORY — DX: Other disorders of nervous system: G98.8

## 2022-08-15 HISTORY — DX: Tachycardia, unspecified: R00.0

## 2022-08-15 NOTE — Patient Instructions (Signed)
Erin Murray  08/15/2022     '@PREFPERIOPPHARMACY'$ @   Your procedure is scheduled on 08/19/2022.  Report to [facility] at 8:15 A.M.  Call this number if you have problems the morning of surgery:  [phone]  If you experience any cold or flu symptoms such as cough, fever, chills, shortness of breath, etc. between now and your scheduled surgery, please notify us at the above number.   Remember:    Please follow the diet and prep instructions given to you by Dr Ave Filter office     Take these medicines the morning of surgery with A SIP OF WATER : Flexeril Mobic Metoprolol Topomax Pantoprazole Maxalt Robaxin      Do not wear jewelry, make-up or nail polish.  Do not wear lotions, powders, or perfumes, or deodorant.  Do not shave 48 hours prior to surgery.  Men may shave face and neck.  Do not bring valuables to the hospital.  [facility] is not responsible for any belongings or valuables.  Contacts, dentures or bridgework may not be worn into surgery.  Leave your suitcase in the car.  After surgery it may be brought to your room.  For patients admitted to the hospital, discharge time will be determined by your treatment team.  Patients discharged the day of surgery will not be allowed to drive home.   Name and phone number of your driver:   Family Special instructions:  N/A  Please read over the following fact sheets that you were given. Care and Recovery After Surgery     Colonoscopy, Adult A colonoscopy is a procedure to look at the entire large intestine. This procedure is done using a long, thin, flexible tube that has a camera on the end. You may have a colonoscopy: As a part of normal colorectal screening. If you have certain symptoms, such as: A low number of red blood cells in your blood (anemia). Diarrhea that does not go away. Pain in your abdomen. Blood in your stool. A colonoscopy can help screen for and diagnose medical problems, including: An abnormal  growth of cells or tissue (tumor). Abnormal growths within the lining of your intestine (polyps). Inflammation. Areas of bleeding. Tell your health care provider about: Any allergies you have. All medicines you are taking, including vitamins, herbs, eye drops, creams, and over-the-counter medicines. Any problems you or family members have had with anesthetic medicines. Any bleeding problems you have. Any surgeries you have had. Any medical conditions you have. Any problems you have had with having bowel movements. Whether you are pregnant or may be pregnant. What are the risks? Generally, this is a safe procedure. However, problems may occur, including: Bleeding. Damage to your intestine. Allergic reactions to medicines given during the procedure. Infection. This is rare. What happens before the procedure? Eating and drinking restrictions Follow instructions from your health care provider about eating or drinking restrictions, which may include: A few days before the procedure: Follow a low-fiber diet. Avoid nuts, seeds, dried fruit, raw fruits, and vegetables. 1-3 days before the procedure: Eat only gelatin dessert or ice pops. Drink only clear liquids, such as water, clear juice, clear broth or bouillon, black coffee or tea, or clear soft drinks or sports drinks. Avoid liquids that contain red or purple dye. The day of the procedure: Do not eat solid foods. You may continue to drink clear liquids until up to 2 hours before the procedure. Do not eat or drink anything starting 2 hours before the  procedure, or within the time period that your health care provider recommends. Bowel prep If you were prescribed a bowel prep to take by mouth (orally) to clean out your colon: Take it as told by your health care provider. Starting the day before your procedure, you will need to drink a large amount of liquid medicine. The liquid will cause you to have many bowel movements of loose stool  until your stool becomes almost clear or light green. If your skin or the opening between the buttocks (anus) gets irritated from diarrhea, you may relieve the irritation using: Wipes with medicine in them, such as adult wet wipes with aloe and vitamin E. A product to soothe skin, such as petroleum jelly. If you vomit while drinking the bowel prep: Take a break for up to 60 minutes. Begin the bowel prep again. Call your health care provider if you keep vomiting or you cannot take the bowel prep without vomiting. To clean out your colon, you may also be given: Laxative medicines. These help you have a bowel movement. Instructions for enema use. An enema is liquid medicine injected into your rectum. Medicines Ask your health care provider about: Changing or stopping your regular medicines or supplements. This is especially important if you are taking iron supplements, diabetes medicines, or blood thinners. Taking medicines such as aspirin and ibuprofen. These medicines can thin your blood. Do not take these medicines unless your health care provider tells you to take them. Taking over-the-counter medicines, vitamins, herbs, and supplements. General instructions Ask your health care provider what steps will be taken to help prevent infection. These may include washing skin with a germ-killing soap. If you will be going home right after the procedure, plan to have a responsible adult: Take you home from the hospital or clinic. You will not be allowed to drive. Care for you for the time you are told. What happens during the procedure?  An IV will be inserted into one of your veins. You will be given a medicine to make you fall asleep (general anesthetic). You will lie on your side with your knees bent. A lubricant will be put on the tube. Then the tube will be: Inserted into your anus. Gently eased through all parts of your large intestine. Air will be sent into your colon to keep it open.  This may cause some pressure or cramping. Images will be taken with the camera and will appear on a screen. A small tissue sample may be removed to be looked at under a microscope (biopsy). The tissue may be sent to a lab for testing if any signs of problems are found. If small polyps are found, they may be removed and checked for cancer cells. When the procedure is finished, the tube will be removed. The procedure may vary among health care providers and hospitals. What happens after the procedure? Your blood pressure, heart rate, breathing rate, and blood oxygen level will be monitored until you leave the hospital or clinic. You may have a small amount of blood in your stool. You may pass gas and have mild cramping or bloating in your abdomen. This is caused by the air that was used to open your colon during the exam. If you were given a sedative during the procedure, it can affect you for several hours. Do not drive or operate machinery until your health care provider says that it is safe. It is up to you to get the results of your procedure. Ask your  health care provider, or the department that is doing the procedure, when your results will be ready. Summary A colonoscopy is a procedure to look at the entire large intestine. Follow instructions from your health care provider about eating and drinking before the procedure. If you were prescribed an oral bowel prep to clean out your colon, take it as told by your health care provider. During the colonoscopy, a flexible tube with a camera on its end is inserted into the anus and then passed into all parts of the large intestine. This information is not intended to replace advice given to you by your health care provider. Make sure you discuss any questions you have with your health care provider. Document Revised: 06/25/2021 Document Reviewed: 02/21/2021 Elsevier Patient Education  [place] [facility], Adult Upper endoscopy is  a procedure to look inside the upper GI (gastrointestinal) tract. The upper GI tract is made up of: The esophagus. This is the part of the body that moves food from your mouth to your stomach. The stomach. The duodenum. This is the first part of your small intestine. This procedure is also called esophagogastroduodenoscopy (EGD) or gastroscopy. In this procedure, your health care provider passes a thin, flexible tube (endoscope) through your mouth and down your esophagus into your stomach and into your duodenum. A small camera is attached to the end of the tube. Images from the camera appear on a monitor in the exam room. During this procedure, your health care provider may also remove a small piece of tissue to be sent to a lab and examined under a microscope (biopsy). Your health care provider may do an upper endoscopy to diagnose cancers of the upper GI tract. You may also have this procedure to find the cause of other conditions, such as: Stomach pain. Heartburn. Pain or problems when swallowing. Nausea and vomiting. Stomach bleeding. Stomach ulcers. Tell a health care provider about: Any allergies you have. All medicines you are taking, including vitamins, herbs, eye drops, creams, and over-the-counter medicines. Any problems you or family members have had with anesthetic medicines. Any bleeding problems you have. Any surgeries you have had. Any medical conditions you have. Whether you are pregnant or may be pregnant. What are the risks? Your healthcare provider will talk with you about risks. These may include: Infection. Bleeding. Allergic reactions to medicines. A tear or hole (perforation) in the esophagus, stomach, or duodenum. What happens before the procedure? When to stop eating and drinking Follow instructions from your health care provider about what you may eat and drink. These may include: 8 hours before your procedure Stop eating most foods. Do not eat meat, fried  foods, or fatty foods. Eat only light foods, such as toast or crackers. All liquids are okay except energy drinks and alcohol. 6 hours before your procedure Stop eating. Drink only clear liquids, such as water, clear fruit juice, black coffee, plain tea, and sports drinks. Do not drink energy drinks or alcohol. 2 hours before your procedure Stop drinking all liquids. You may be allowed to take medicines with small sips of water. If you do not follow your health care provider's instructions, your procedure may be delayed or canceled. Medicines Ask your health care provider about: Changing or stopping your regular medicines. This is especially important if you are taking diabetes medicines or blood thinners. Taking medicines such as aspirin and ibuprofen. These medicines can thin your blood. Do not take these medicines unless your health care provider tells  you to take them. Taking over-the-counter medicines, vitamins, herbs, and supplements. General instructions If you will be going home right after the procedure, plan to have a responsible adult: Take you home from the hospital or clinic. You will not be allowed to drive. Care for you for the time you are told. What happens during the procedure?  An IV will be inserted into one of your veins. You may be given one or more of the following: A medicine to help you relax (sedative). A medicine to numb the throat (local anesthetic). You will lie on your left side on an exam table. Your health care provider will pass the endoscope through your mouth and down your esophagus. Your health care provider will use the scope to check the inside of your esophagus, stomach, and duodenum. Biopsies may be taken. The endoscope will be removed. The procedure may vary among health care providers and hospitals. What happens after the procedure? Your blood pressure, heart rate, breathing rate, and blood oxygen level will be monitored until you leave the  hospital or clinic. When your throat is no longer numb, you may be given some fluids to drink. If you were given a sedative during the procedure, it can affect you for several hours. Do not drive or operate machinery until your health care provider says that it is safe. It is up to you to get the results of your procedure. Ask your health care provider, or the department that is doing the procedure, when your results will be ready. Contact a health care provider if you: Have a sore throat that lasts longer than 1 day. Have a fever. Get help right away if you: Vomit blood or your vomit looks like coffee grounds. Have bloody, black, or tarry stools. Have a very bad sore throat or you cannot swallow. Have difficulty breathing or very bad pain in your chest or abdomen. These symptoms may be an emergency. Get help right away. Call 911. Do not wait to see if the symptoms will go away. Do not drive yourself to the hospital. Summary Upper endoscopy is a procedure to look inside the upper GI tract. During the procedure, an IV will be inserted into one of your veins. You may be given a medicine to help you relax. The endoscope will be passed through your mouth and down your esophagus. Follow instructions from your health care provider about what you can eat and drink. This information is not intended to replace advice given to you by your health care provider. Make sure you discuss any questions you have with your health care provider. Document Revised: 10/10/2021 Document Reviewed: 10/10/2021 Elsevier Patient Education  [place] Anesthesia refers to the techniques, procedures, and medicines that help a person stay safe and comfortable during surgery. Monitored anesthesia care, or sedation, is one type of anesthesia. You may have sedation if you do not need to be asleep for your procedure. Procedures that use sedation may include: Surgery to remove cataracts from  your eyes. A dental procedure. A biopsy. This is when a tissue sample is removed and looked at under a microscope. You will be watched closely during your procedure. Your level of sedation or type of anesthesia may be changed to fit your needs. Tell a health care provider about: Any allergies you have. All medicines you are taking, including vitamins, herbs, eye drops, creams, and over-the-counter medicines. Any problems you or family members have had with anesthesia. Any bleeding problems you  have. Any surgeries you have had. Any medical conditions or illnesses you have. This includes sleep apnea, cough, fever, or the flu. Whether you are pregnant or may be pregnant. Whether you use cigarettes, alcohol, or drugs. Any use of steroids, whether by mouth or as a cream. What are the risks? Your health care provider will talk with you about risks. These may include: Getting too much medicine (oversedation). Nausea. Allergic reactions to medicines. Trouble breathing. If this happens, a breathing tube may be used to help you breathe. It will be removed when you are awake and breathing on your own. Heart trouble. Lung trouble. Confusion that gets better with time (emergence delirium). What happens before the procedure? When to stop eating and drinking Follow instructions from your health care provider about what you may eat and drink. These may include: 8 hours before your procedure Stop eating most foods. Do not eat meat, fried foods, or fatty foods. Eat only light foods, such as toast or crackers. All liquids are okay except energy drinks and alcohol. 6 hours before your procedure Stop eating. Drink only clear liquids, such as water, clear fruit juice, black coffee, plain tea, and sports drinks. Do not drink energy drinks or alcohol. 2 hours before your procedure Stop drinking all liquids. You may be allowed to take medicines with small sips of water. If you do not follow your health  care provider's instructions, your procedure may be delayed or canceled. Medicines Ask your health care provider about: Changing or stopping your regular medicines. These include any diabetes medicines or blood thinners you take. Taking medicines such as aspirin and ibuprofen. These medicines can thin your blood. Do not take them unless your health care provider tells you to. Taking over-the-counter medicines, vitamins, herbs, and supplements. Testing You may have an exam or testing. You may have a blood or urine sample taken. General instructions Do not use any products that contain nicotine or tobacco for at least 4 weeks before the procedure. These products include cigarettes, chewing tobacco, and vaping devices, such as e-cigarettes. If you need help quitting, ask your health care provider. If you will be going home right after the procedure, plan to have a responsible adult: Take you home from the hospital or clinic. You will not be allowed to drive. Care for you for the time you are told. What happens during the procedure?  Your blood pressure, heart rate, breathing, level of pain, and blood oxygen level will be monitored. An IV will be inserted into one of your veins. You may be given: A sedative. This helps you relax. Anesthesia. This will: Numb certain areas of your body. Make you fall asleep for surgery. You will be given medicines as needed to keep you comfortable. The more medicine you are given, the deeper your level of sedation will be. Your level of sedation may be changed to fit your needs. There are three levels of sedation: Mild sedation. At this level, you may feel awake and relaxed. You will be able to follow directions. Moderate sedation. At this level, you will be sleepy. You may not remember the procedure. Deep sedation. At this level, you will be asleep. You will not remember the procedure. How you get the medicines will depend on your age and the procedure. They  may be given as: A pill. This may be taken by mouth (orally) or inserted into the rectum. An injection. This may be into a vein or muscle. A spray through the nose. After your  procedure is over, the medicine will be stopped. The procedure may vary among health care providers and hospitals. What happens after the procedure? Your blood pressure, heart rate, breathing rate, and blood oxygen level will be monitored until you leave the hospital or clinic. You may feel sleepy, clumsy, or nauseous. You may not remember what happened during or after the procedure. Sedation can affect you for several hours. Do not drive or use machinery until your health care provider says that it is safe. This information is not intended to replace advice given to you by your health care provider. Make sure you discuss any questions you have with your health care provider. Document Revised: 11/26/2021 Document Reviewed: 11/26/2021 Elsevier Patient Education  [place].

## 2022-08-16 ENCOUNTER — Other Ambulatory Visit: Payer: Self-pay | Admitting: *Deleted

## 2022-08-16 MED ORDER — PEG 3350-KCL-NA BICARB-NACL 420 G PO SOLR: 4000.0000 mL | Freq: Once | ORAL | 0 refills | Status: DC

## 2022-08-16 MED ORDER — PEG 3350-KCL-NA BICARB-NACL 420 G PO SOLR: 4000.0000 mL | Freq: Once | ORAL | 0 refills | Status: AC

## 2022-08-18 IMAGING — CT CT ABD-PELV W/ CM
2 of 5 series · 16 of 46 positions shown, 18 images · IV contrast (Omnipaque or Isovue)
Comparison: 08/15/2021

CLINICAL DATA: Pain right lower quadrant

EXAM:
CT ABDOMEN AND PELVIS WITH CONTRAST
TECHNIQUE: Multidetector CT imaging of the abdomen and pelvis was performed
using the standard protocol following bolus administration of
intravenous contrast.

[Series 2: axial st · axial · 0.80mm/px · z∈[+432,+867]mm · 13 of 99 slices shown, 15 images]
[im 6/99  soft-tissue]
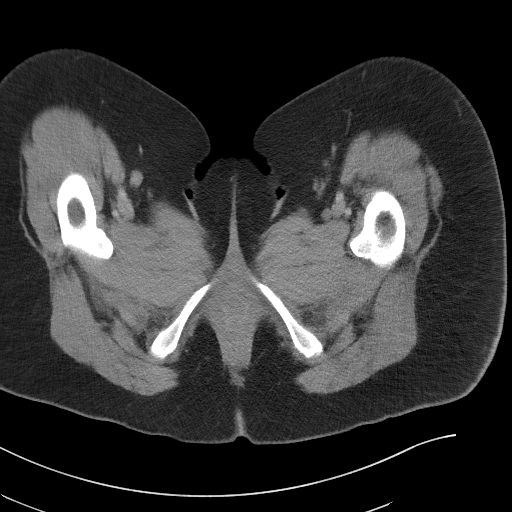
[im 6/99  bone]
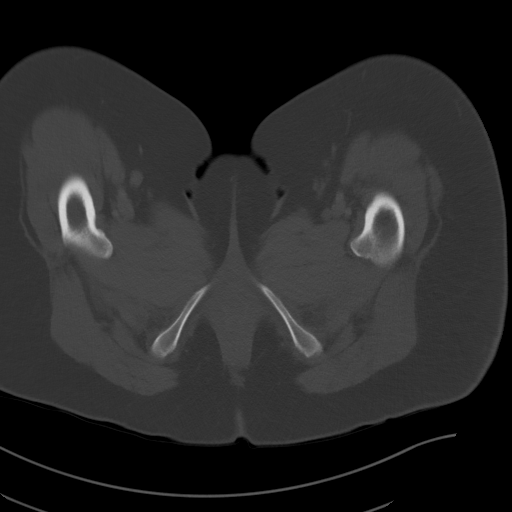
[im 11/99  soft-tissue]
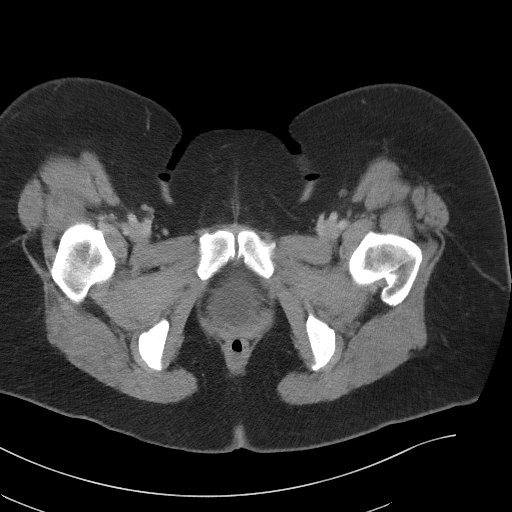
[im 22/99  soft-tissue]
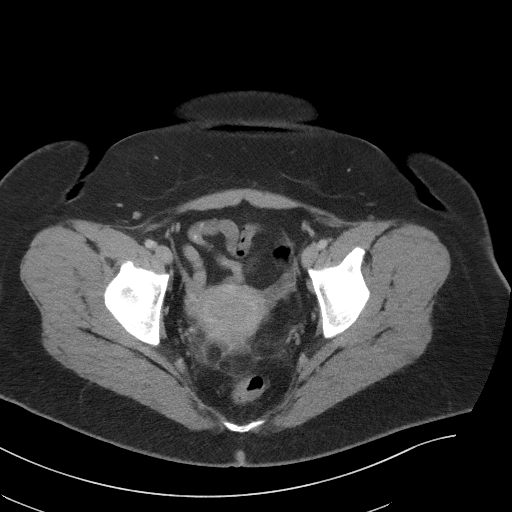
[im 28/99  soft-tissue]
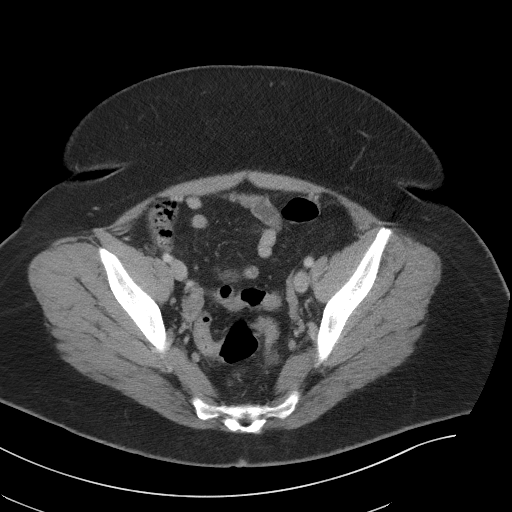
[im 33/99  soft-tissue]
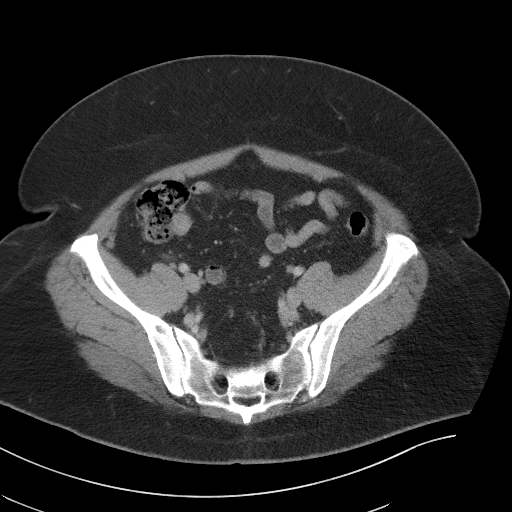
[im 44/99  soft-tissue]
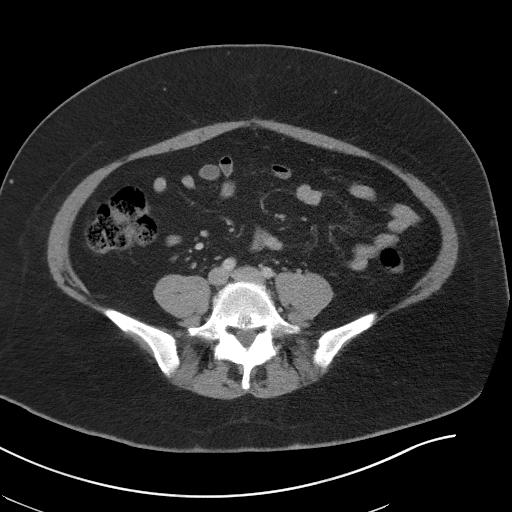
[im 50/99  soft-tissue]
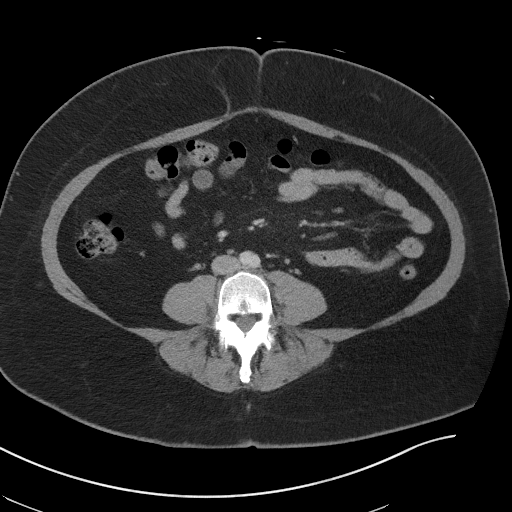
[im 55/99  soft-tissue]
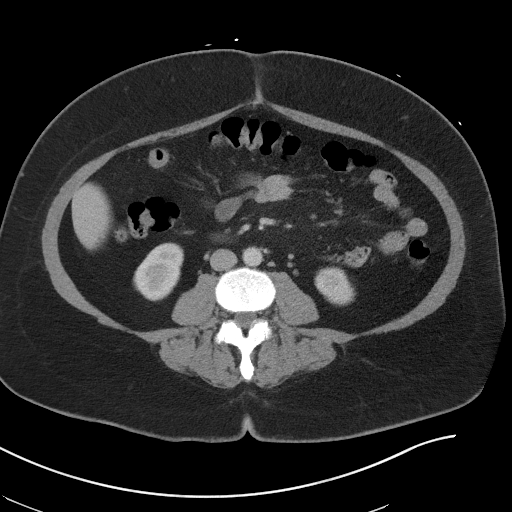
[im 66/99  soft-tissue]
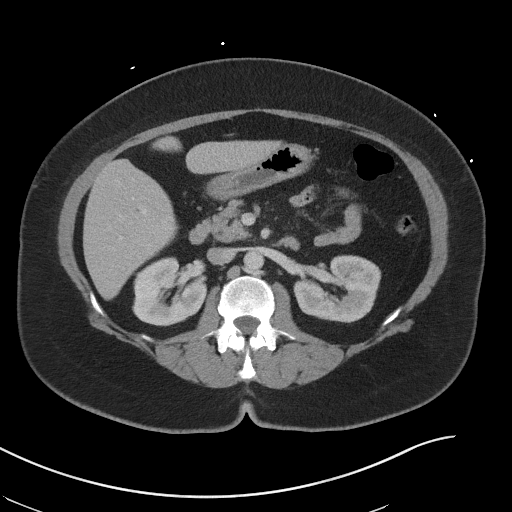
[im 66/99  bone]
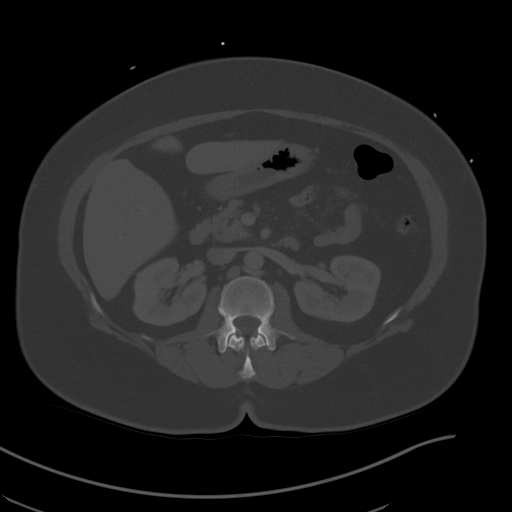
[im 71/99  soft-tissue]
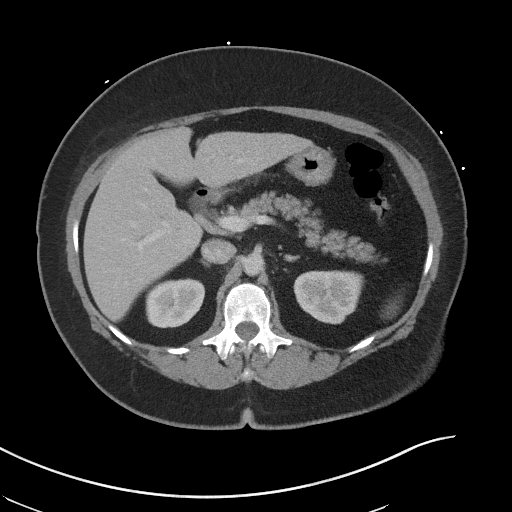
[im 77/99  soft-tissue]
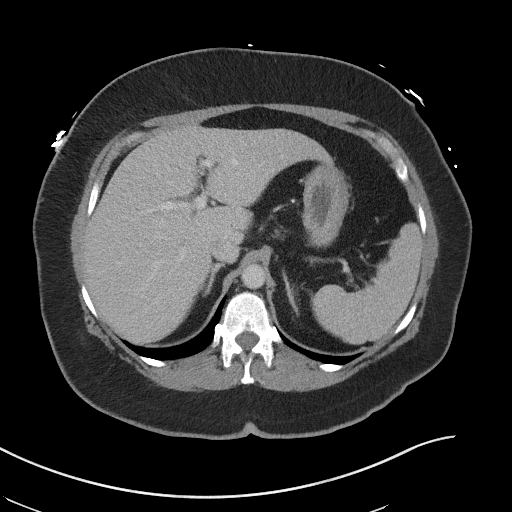
[im 88/99  soft-tissue]
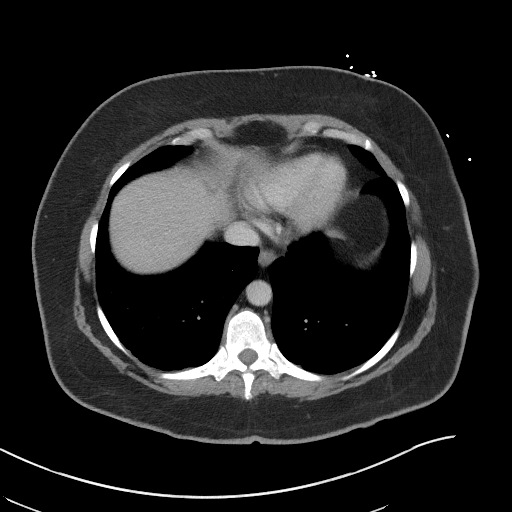
[im 93/99  soft-tissue]
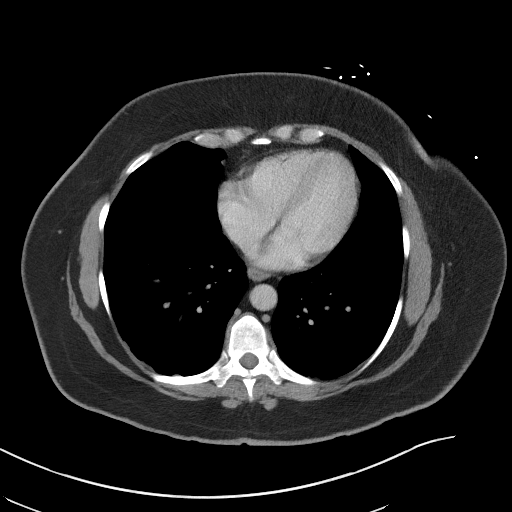

[Series 6: coronal st · coronal · 0.94mm/px · 3 of 117 slices shown]
[im 39/117  soft-tissue]
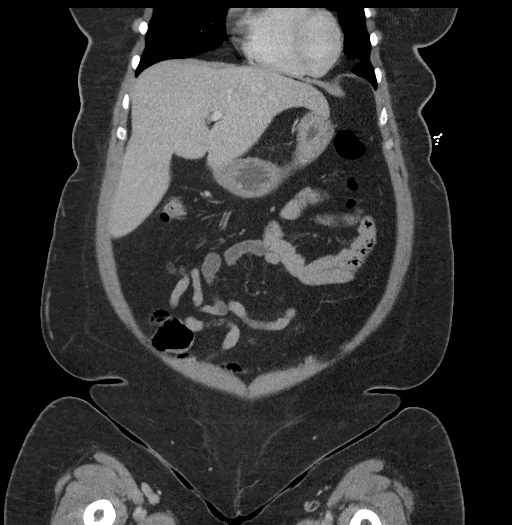
[im 52/117  soft-tissue]
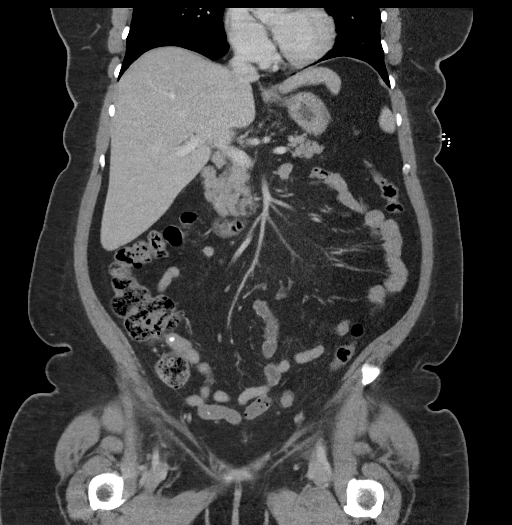
[im 65/117  soft-tissue]
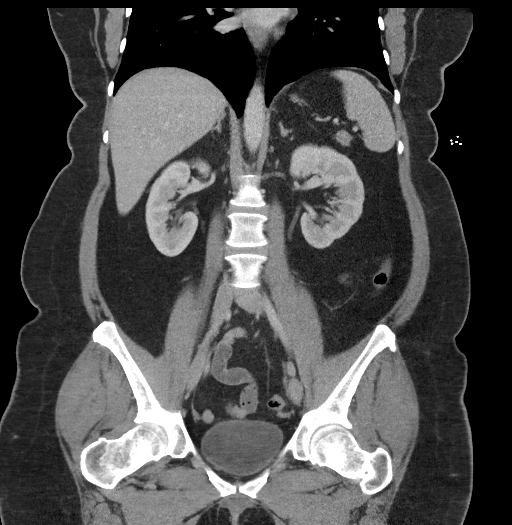

[16 of 46 positions shown; findings below may reference images not displayed]

RADIATION DOSE REDUCTION: This exam was performed according to the
departmental dose-optimization program which includes automated
exposure control, adjustment of the mA and/or kV according to
patient size and/or use of iterative reconstruction technique.

CONTRAST:  100mL OMNIPAQUE IOHEXOL 300 MG/ML  SOLN
FINDINGS: Lower chest: Unremarkable.

Hepatobiliary: Liver measures 20.3 cm in length. There is slight
prominence of bile ducts which may be due to previous
cholecystectomy.

Pancreas: No focal abnormality is seen.

Spleen: Unremarkable.

Adrenals/Urinary Tract: Adrenals are unremarkable. There is no
hydronephrosis. There are no renal or ureteral stones. Urinary
bladder is not distended.

Stomach/Bowel: Stomach is unremarkable. Small bowel loops are not
dilated. Appendix is unremarkable. There is no significant wall
thickening in colon. There is mild stranding adjacent to the
inferior margin sigmoid colon in the right side of pelvis. There is
no definite demonstrable diverticula in colon. There is no loculated
pericolic fluid collection.

Vascular/Lymphatic: Unremarkable.

Reproductive: There is slightly inhomogeneous attenuation in the
myometrium. There is small exophytic nodule in the fundus of the
uterus. Findings suggest possible uterine fibroids. There are no
adnexal masses. There is no definite inflammatory stranding adjacent
to the ovaries.

Other: There is no significant ascites or pneumoperitoneum. There is
tiny umbilical hernia containing fat.

Musculoskeletal: Unremarkable.
IMPRESSION: There is no evidence of intestinal obstruction or pneumoperitoneum.
Appendix is not dilated. There is no hydronephrosis.

There is interval appearance of inflammatory stranding in the fat
planes in the right side of pelvis adjacent to sigmoid colon.
Findings suggest possible mild acute diverticulitis or inflammation
of appendix epiploica or nonspecific colitis. There is no loculated
pericolic fluid collection.

Other findings as described in the body of the report.

## 2022-08-19 ENCOUNTER — Ambulatory Visit (HOSPITAL_COMMUNITY)
Admission: RE | Admit: 2022-08-19 | Discharge: 2022-08-19 | Disposition: A | Discharge status: Home / Self Care | Payer: 59 | Attending: Internal Medicine | Admitting: Internal Medicine

## 2022-08-19 ENCOUNTER — Ambulatory Visit (HOSPITAL_COMMUNITY): Payer: 59 | Admitting: Anesthesiology

## 2022-08-19 ENCOUNTER — Encounter (HOSPITAL_COMMUNITY)
Admission: RE | Disposition: A | Discharge status: Home / Self Care | Payer: Self-pay | Source: Home / Self Care | Attending: Internal Medicine

## 2022-08-19 ENCOUNTER — Ambulatory Visit (HOSPITAL_BASED_OUTPATIENT_CLINIC_OR_DEPARTMENT_OTHER): Payer: 59 | Admitting: Anesthesiology

## 2022-08-19 ENCOUNTER — Encounter (HOSPITAL_COMMUNITY): Payer: Self-pay

## 2022-08-19 DIAGNOSIS — K5900 Constipation, unspecified: Secondary | ICD-10-CM | POA: Insufficient documentation

## 2022-08-19 DIAGNOSIS — Z8673 Personal history of transient ischemic attack (TIA), and cerebral infarction without residual deficits: Secondary | ICD-10-CM | POA: Insufficient documentation

## 2022-08-19 DIAGNOSIS — K2970 Gastritis, unspecified, without bleeding: Secondary | ICD-10-CM

## 2022-08-19 DIAGNOSIS — Z7982 Long term (current) use of aspirin: Secondary | ICD-10-CM | POA: Insufficient documentation

## 2022-08-19 DIAGNOSIS — R569 Unspecified convulsions: Secondary | ICD-10-CM | POA: Diagnosis not present

## 2022-08-19 DIAGNOSIS — D125 Benign neoplasm of sigmoid colon: Secondary | ICD-10-CM | POA: Insufficient documentation

## 2022-08-19 DIAGNOSIS — K635 Polyp of colon: Secondary | ICD-10-CM

## 2022-08-19 DIAGNOSIS — R1012 Left upper quadrant pain: Secondary | ICD-10-CM | POA: Diagnosis not present

## 2022-08-19 DIAGNOSIS — K219 Gastro-esophageal reflux disease without esophagitis: Secondary | ICD-10-CM | POA: Insufficient documentation

## 2022-08-19 DIAGNOSIS — K2950 Unspecified chronic gastritis without bleeding: Secondary | ICD-10-CM | POA: Insufficient documentation

## 2022-08-19 DIAGNOSIS — G629 Polyneuropathy, unspecified: Secondary | ICD-10-CM | POA: Diagnosis not present

## 2022-08-19 DIAGNOSIS — Z09 Encounter for follow-up examination after completed treatment for conditions other than malignant neoplasm: Secondary | ICD-10-CM | POA: Insufficient documentation

## 2022-08-19 DIAGNOSIS — D122 Benign neoplasm of ascending colon: Secondary | ICD-10-CM | POA: Diagnosis not present

## 2022-08-19 DIAGNOSIS — Z79899 Other long term (current) drug therapy: Secondary | ICD-10-CM | POA: Diagnosis not present

## 2022-08-19 DIAGNOSIS — K317 Polyp of stomach and duodenum: Secondary | ICD-10-CM | POA: Insufficient documentation

## 2022-08-19 DIAGNOSIS — I10 Essential (primary) hypertension: Secondary | ICD-10-CM | POA: Diagnosis not present

## 2022-08-19 DIAGNOSIS — Z9049 Acquired absence of other specified parts of digestive tract: Secondary | ICD-10-CM | POA: Diagnosis not present

## 2022-08-19 DIAGNOSIS — Z1211 Encounter for screening for malignant neoplasm of colon: Secondary | ICD-10-CM | POA: Insufficient documentation

## 2022-08-19 DIAGNOSIS — Z800 Family history of malignant neoplasm of digestive organs: Secondary | ICD-10-CM | POA: Diagnosis not present

## 2022-08-19 HISTORY — PX: BIOPSY: SHX5522

## 2022-08-19 HISTORY — PX: POLYPECTOMY: SHX5525

## 2022-08-19 HISTORY — PX: COLONOSCOPY WITH PROPOFOL: SHX5780

## 2022-08-19 HISTORY — PX: ESOPHAGOGASTRODUODENOSCOPY (EGD) WITH PROPOFOL: SHX5813

## 2022-08-19 SURGERY — COLONOSCOPY WITH PROPOFOL
Anesthesia: General

## 2022-08-19 MED ORDER — PHENYLEPHRINE 80 MCG/ML (10ML) SYRINGE FOR IV PUSH (FOR BLOOD PRESSURE SUPPORT)
PREFILLED_SYRINGE | INTRAVENOUS | Status: DC | PRN
  Administered 2022-08-19: 160 ug via INTRAVENOUS
  Administered 2022-08-19 (×2): 80 ug via INTRAVENOUS

## 2022-08-19 MED ORDER — LIDOCAINE HCL (CARDIAC) PF 100 MG/5ML IV SOSY
PREFILLED_SYRINGE | INTRAVENOUS | Status: DC | PRN
  Administered 2022-08-19: 50 mg via INTRAVENOUS

## 2022-08-19 MED ORDER — LACTATED RINGERS IV SOLN: INTRAVENOUS | Status: DC

## 2022-08-19 MED ORDER — PROPOFOL 500 MG/50ML IV EMUL
INTRAVENOUS | Status: DC | PRN
  Administered 2022-08-19: 200 ug/kg/min via INTRAVENOUS

## 2022-08-19 MED ORDER — PROPOFOL 10 MG/ML IV BOLUS
INTRAVENOUS | Status: DC | PRN
  Administered 2022-08-19: 40 mg via INTRAVENOUS
  Administered 2022-08-19: 100 mg via INTRAVENOUS

## 2022-08-19 NOTE — Interval H&P Note (Signed)
History and Physical Interval Note:  08/19/2022 9:15 AM  Erin Murray  has presented today for surgery, with the diagnosis of FHX CRC, LUQ pain, gerd.  The various methods of treatment have been discussed with the patient and family. After consideration of risks, benefits and other options for treatment, the patient has consented to  Procedure(s) with comments: COLONOSCOPY WITH PROPOFOL (N/A) - 10:15am, asa 3 ESOPHAGOGASTRODUODENOSCOPY (EGD) WITH PROPOFOL (N/A) as a surgical intervention.  The patient's history has been reviewed, patient examined, no change in status, stable for surgery.  I have reviewed the patient's chart and labs.  Questions were answered to the patient's satisfaction.     Eloise Harman

## 2022-08-19 NOTE — Discharge Instructions (Addendum)
EGD Discharge instructions Please read the instructions outlined below and refer to this sheet in the next few weeks. These discharge instructions provide you with general information on caring for yourself after you leave the hospital. Your doctor may also give you specific instructions. While your treatment has been planned according to the most current medical practices available, unavoidable complications occasionally occur. If you have any problems or questions after discharge, please call your doctor. ACTIVITY You may resume your regular activity but move at a slower pace for the next 24 hours.  Take frequent rest periods for the next 24 hours.  Walking will help expel (get rid of) the air and reduce the bloated feeling in your abdomen.  No driving for 24 hours (because of the anesthesia (medicine) used during the test).  You may shower.  Do not sign any important legal documents or operate any machinery for 24 hours (because of the anesthesia used during the test).  NUTRITION Drink plenty of fluids.  You may resume your normal diet.  Begin with a light meal and progress to your normal diet.  Avoid alcoholic beverages for 24 hours or as instructed by your caregiver.  MEDICATIONS You may resume your normal medications unless your caregiver tells you otherwise.  WHAT YOU CAN EXPECT TODAY You may experience abdominal discomfort such as a feeling of fullness or "gas" pains.  FOLLOW-UP Your doctor will discuss the results of your test with you.  SEEK IMMEDIATE MEDICAL ATTENTION IF ANY OF THE FOLLOWING OCCUR: Excessive nausea (feeling sick to your stomach) and/or vomiting.  Severe abdominal pain and distention (swelling).  Trouble swallowing.  Temperature over 101 F (37.8 C).  Rectal bleeding or vomiting of blood.    Colonoscopy Discharge Instructions  Read the instructions outlined below and refer to this sheet in the next few weeks. These discharge instructions provide you with  general information on caring for yourself after you leave the hospital. Your doctor may also give you specific instructions. While your treatment has been planned according to the most current medical practices available, unavoidable complications occasionally occur.   ACTIVITY You may resume your regular activity, but move at a slower pace for the next 24 hours.  Take frequent rest periods for the next 24 hours.  Walking will help get rid of the air and reduce the bloated feeling in your belly (abdomen).  No driving for 24 hours (because of the medicine (anesthesia) used during the test).   Do not sign any important legal documents or operate any machinery for 24 hours (because of the anesthesia used during the test).  NUTRITION Drink plenty of fluids.  You may resume your normal diet as instructed by your doctor.  Begin with a light meal and progress to your normal diet. Heavy or fried foods are harder to digest and may make you feel sick to your stomach (nauseated).  Avoid alcoholic beverages for 24 hours or as instructed.  MEDICATIONS You may resume your normal medications unless your doctor tells you otherwise.  WHAT YOU CAN EXPECT TODAY Some feelings of bloating in the abdomen.  Passage of more gas than usual.  Spotting of blood in your stool or on the toilet paper.  IF YOU HAD POLYPS REMOVED DURING THE COLONOSCOPY: No aspirin products for 7 days or as instructed.  No alcohol for 7 days or as instructed.  Eat a soft diet for the next 24 hours.  FINDING OUT THE RESULTS OF YOUR TEST Not all test results are available  during your visit. If your test results are not back during the visit, make an appointment with your caregiver to find out the results. Do not assume everything is normal if you have not heard from your caregiver or the medical facility. It is important for you to follow up on all of your test results.  SEEK IMMEDIATE MEDICAL ATTENTION IF: You have more than a spotting of  blood in your stool.  Your belly is swollen (abdominal distention).  You are nauseated or vomiting.  You have a temperature over 101.  You have abdominal pain or discomfort that is severe or gets worse throughout the day.   Your EGD revealed mild amount inflammation in your stomach.  I took biopsies of this to rule out infection with a bacteria called H. pylori.  Await pathology results, my office will contact you.  Esophagus and small bowel appeared normal.  Your colonoscopy revealed 2 polyp(s) which I removed successfully. Await pathology results, my office will contact you. I recommend repeating colonoscopy in 5 years for surveillance purposes.   Follow up with GI in 2-3  months    OFFICE WILL CONTACT OR CHECK MYCHART   I hope you have a great rest of your week!  Elon Alas. Abbey Chatters, D.O. Gastroenterology and Hepatology [facility]

## 2022-08-19 NOTE — Transfer of Care (Signed)
Immediate Anesthesia Transfer of Care Note  Patient: Erin Murray  Procedure(s) Performed: COLONOSCOPY WITH PROPOFOL ESOPHAGOGASTRODUODENOSCOPY (EGD) WITH PROPOFOL BIOPSY POLYPECTOMY  Patient Location: Short Stay  Anesthesia Type:General  Level of Consciousness: awake  Airway & Oxygen Therapy: Patient Spontanous Breathing  Post-op Assessment: Report given to RN and Post -op Vital signs reviewed and stable  Post vital signs: Reviewed and stable  Last Vitals:  Vitals Value Taken Time  BP 91/38 08/19/22 1019  Temp 36.5 C 08/19/22 1019  Pulse 72 08/19/22 1019  Resp 17 08/19/22 1019  SpO2 98 % 08/19/22 1019    Last Pain:  Vitals:   08/19/22 0934  PainSc: 4       Patients Stated Pain Goal: 6 (47/07/61 5183)  Complications: No notable events documented.

## 2022-08-19 NOTE — Anesthesia Preprocedure Evaluation (Signed)
Anesthesia Evaluation  Patient identified by MRN, date of birth, ID band Patient awake    Reviewed: Allergy & Precautions, H&P , NPO status , Patient's Chart, lab work & pertinent test results, reviewed documented beta blocker date and time   Airway Mallampati: II  TM Distance: >3 FB Neck ROM: full    Dental no notable dental hx.    Pulmonary neg pulmonary ROS   Pulmonary exam normal breath sounds clear to auscultation       Cardiovascular Exercise Tolerance: Good hypertension, negative cardio ROS + dysrhythmias  Rhythm:regular Rate:Normal     Neuro/Psych  Headaches, Seizures -,  TIA Neuromuscular disease negative neurological ROS  negative psych ROS   GI/Hepatic negative GI ROS, Neg liver ROS,GERD  ,,  Endo/Other  negative endocrine ROS    Renal/GU negative Renal ROS  negative genitourinary   Musculoskeletal   Abdominal   Peds  Hematology negative hematology ROS (+)   Anesthesia Other Findings   Reproductive/Obstetrics negative OB ROS                             Anesthesia Physical Anesthesia Plan  ASA: 2  Anesthesia Plan: General   Post-op Pain Management:    Induction:   PONV Risk Score and Plan: Propofol infusion  Airway Management Planned:   Additional Equipment:   Intra-op Plan:   Post-operative Plan:   Informed Consent: I have reviewed the patients History and Physical, chart, labs and discussed the procedure including the risks, benefits and alternatives for the proposed anesthesia with the patient or authorized representative who has indicated his/her understanding and acceptance.     Dental Advisory Given  Plan Discussed with: CRNA  Anesthesia Plan Comments:        Anesthesia Quick Evaluation

## 2022-08-19 NOTE — Progress Notes (Signed)
Please excuse Erin Murray from work on Monday 08/19/2022 to after 11:00am on 08/20/2022.  She cannot drive, operate machinery or sign legal documents for 24 hours. SHe can return to work after 11:00am on Tuesday August 20, 2022.

## 2022-08-21 LAB — SURGICAL PATHOLOGY

## 2022-08-21 NOTE — Anesthesia Postprocedure Evaluation (Signed)
Anesthesia Post Note  Patient: Erin Murray  Procedure(s) Performed: COLONOSCOPY WITH PROPOFOL ESOPHAGOGASTRODUODENOSCOPY (EGD) WITH PROPOFOL BIOPSY POLYPECTOMY  Patient location during evaluation: Phase II Anesthesia Type: General Level of consciousness: awake Pain management: pain level controlled Vital Signs Assessment: post-procedure vital signs reviewed and stable Respiratory status: spontaneous breathing and respiratory function stable Cardiovascular status: blood pressure returned to baseline and stable Postop Assessment: no headache and no apparent nausea or vomiting Anesthetic complications: no Comments: Late entry   No notable events documented.   Last Vitals:  Vitals:   08/19/22 1019 08/19/22 1024  BP: (!) 91/38 90/76  Pulse: 72   Resp: 17   Temp: 36.5 C   SpO2: 98%     Last Pain:  Vitals:   08/19/22 0934  PainSc: [place]

## 2022-08-23 NOTE — Op Note (Signed)
[facility] Patient Name: Erin Murray Procedure Date: 08/22/2022 12:46 PM MRN: [id number] Date of Birth: [birth year] Attending MD: Elon Alas. Edgar Frisk, [id number] CSN: [id number] Age: 52 Admit Type: Outpatient Procedure:                Colonoscopy Indications:              Colon cancer screening in patient at increased                            risk: Colorectal cancer in father, Colon cancer                            screening in patient at increased risk: Colorectal                            cancer in sister Providers:                Elon Alas. Abbey Chatters, DO, Caprice Kluver, Thomas Hoff., Technician Referring MD:              Medicines:                See the Anesthesia note for documentation of the                            administered medications Complications:            No immediate complications. Estimated Blood Loss:     Estimated blood loss was minimal. Procedure:                Pre-Anesthesia Assessment:                           - The anesthesia plan was to use monitored                            anesthesia care (MAC).                           After obtaining informed consent, the colonoscope                            was passed under direct vision. Throughout the                            procedure, the patient's blood pressure, pulse, and                            oxygen saturations were monitored continuously. The                            [id number] scope was introduced through                            the anus and advanced to the the cecum,  identified                            by appendiceal orifice and ileocecal valve. The                            colonoscopy was performed without difficulty. The                            patient tolerated the procedure well. The quality                            of the bowel preparation was evaluated using the                            BBPS [facility] Bowel Preparation Scale)  with scores                            of: Right Colon = 3, Transverse Colon = 3 and Left                            Colon = 3 (entire mucosa seen well with no residual                            staining, small fragments of stool or opaque                            liquid). The total BBPS score equals 9. Findings:      The perianal and digital rectal examinations were normal.      A 5 mm polyp was found in the sigmoid colon. The polyp was sessile. The       polyp was removed with a cold snare. Resection and retrieval were       complete.      A 4 mm polyp was found in the ascending colon. The polyp was sessile.       The polyp was removed with a cold snare. Resection and retrieval were       complete.      The exam was otherwise without abnormality. Impression:               - One 5 mm polyp in the sigmoid colon, removed with                            a cold snare. Resected and retrieved.                           - One 4 mm polyp in the ascending colon, removed                            with a cold snare. Resected and retrieved.                           - The examination was otherwise normal. Moderate Sedation:      Per Anesthesia Care Recommendation:           -  Patient has a contact number available for                            emergencies. The signs and symptoms of potential                            delayed complications were discussed with the                            patient. Return to normal activities tomorrow.                            Written discharge instructions were provided to the                            patient.                           - Resume previous diet.                           - Continue present medications.                           - Await pathology results.                           - Repeat colonoscopy in 5 years for surveillance                            and family history of colon cancer.                           - Return to GI clinic  in 3 months. Procedure Code(s):        --- Professional ---                           [phone], Colonoscopy, flexible; with removal of                            tumor(s), polyp(s), or other lesion(s) by snare                            technique Diagnosis Code(s):        --- Professional ---                           D12.5, Benign neoplasm of sigmoid colon                           D12.2, Benign neoplasm of ascending colon                           Z80.0, Family history of malignant neoplasm of  digestive organs CPT copyright 2022 American [employer] Association. All rights reserved. The codes documented in this report are preliminary and upon coder review may  be revised to meet current compliance requirements. Elon Alas. Abbey Chatters, DO [place] Abbey Chatters, DO 08/23/2022 10:40:20 AM This report has been signed electronically. Number of Addenda: 0

## 2022-08-23 NOTE — Op Note (Signed)
[facility] Patient Name: Erin Murray Procedure Date: 08/22/2022 12:45 PM MRN: [id number] Date of Birth: 11/19/1970 Attending MD: Elon Alas. Abbey Chatters , [place], [id number] CSN: [id number] Age: 52 Admit Type: Outpatient Procedure:                Upper GI endoscopy Indications:              Abdominal pain in the left upper quadrant Providers:                Elon Alas. Abbey Chatters, DO, Caprice Kluver, Thomas Hoff., Technician Referring MD:              Medicines:                Monitored Anesthesia Care Complications:            No immediate complications. Estimated Blood Loss:     Estimated blood loss was minimal. Procedure:                Pre-Anesthesia Assessment:                           - The anesthesia plan was to use monitored                            anesthesia care (MAC).                           After obtaining informed consent, the endoscope was                            passed under direct vision. Throughout the                            procedure, the patient's blood pressure, pulse, and                            oxygen saturations were monitored continuously. The                            [id number]) scope was introduced through the                            mouth, and advanced to the second part of duodenum.                            The upper GI endoscopy was accomplished without                            difficulty. The patient tolerated the procedure                            well. Findings:      The Z-line was regular and was found 36 cm from the incisors.      There is no endoscopic evidence of areas of erosion, esophagitis  or       ulcerations in the entire esophagus.      Patchy mild inflammation characterized by erythema was found in the       gastric body. Biopsies were taken with a cold forceps for Helicobacter       pylori testing.      Multiple small fundic gland polyps with no bleeding and no stigmata of        recent bleeding were found in the gastric fundus and in the gastric body.      The duodenal bulb, first portion of the duodenum and second portion of       the duodenum were normal. Impression:               - Z-line regular, 36 cm from the incisors.                           - Gastritis. Biopsied.                           - Multiple gastric polyps.                           - Normal duodenal bulb, first portion of the                            duodenum and second portion of the duodenum. Moderate Sedation:      Per Anesthesia Care Recommendation:           - Patient has a contact number available for                            emergencies. The signs and symptoms of potential                            delayed complications were discussed with the                            patient. Return to normal activities tomorrow.                            Written discharge instructions were provided to the                            patient.                           - Resume previous diet.                           - Continue present medications.                           - Await pathology results.                           - Return to GI clinic in 3 months.                           -  Use Pepcid (famotidine) 20 mg PO daily. Procedure Code(s):        --- Professional ---                           [phone], Esophagogastroduodenoscopy, flexible,                            transoral; with biopsy, single or multiple Diagnosis Code(s):        --- Professional ---                           K29.70, Gastritis, unspecified, without bleeding                           K31.7, Polyp of stomach and duodenum                           R10.12, Left upper quadrant pain CPT copyright 2022 American Medical Association. All rights reserved. The codes documented in this report are preliminary and upon coder review may  be revised to meet current compliance requirements. Elon Alas. Abbey Chatters, DO [place] Abbey Chatters,  DO 08/23/2022 10:37:50 AM This report has been signed electronically. Number of Addenda: 0

## 2022-08-26 ENCOUNTER — Encounter (HOSPITAL_COMMUNITY): Payer: Self-pay | Admitting: Internal Medicine

## 2022-09-02 ENCOUNTER — Other Ambulatory Visit: Payer: Self-pay | Admitting: Internal Medicine

## 2022-09-02 DIAGNOSIS — R000 Tachycardia, unspecified: Secondary | ICD-10-CM

## 2022-09-04 ENCOUNTER — Ambulatory Visit: Payer: 59 | Admitting: Internal Medicine

## 2022-09-04 ENCOUNTER — Encounter: Payer: Self-pay | Admitting: Internal Medicine

## 2022-09-12 ENCOUNTER — Encounter: Payer: Self-pay | Admitting: Radiology

## 2022-09-17 ENCOUNTER — Other Ambulatory Visit: Payer: Self-pay | Admitting: Internal Medicine

## 2022-09-17 DIAGNOSIS — R1012 Left upper quadrant pain: Secondary | ICD-10-CM

## 2022-09-18 ENCOUNTER — Encounter (HOSPITAL_COMMUNITY): Payer: 59

## 2022-09-18 DIAGNOSIS — Z1231 Encounter for screening mammogram for malignant neoplasm of breast: Secondary | ICD-10-CM

## 2022-09-19 DIAGNOSIS — J020 Streptococcal pharyngitis: Secondary | ICD-10-CM | POA: Diagnosis not present

## 2022-09-19 DIAGNOSIS — R030 Elevated blood-pressure reading, without diagnosis of hypertension: Secondary | ICD-10-CM | POA: Diagnosis not present

## 2022-09-23 ENCOUNTER — Encounter: Payer: Self-pay | Admitting: Internal Medicine

## 2022-09-23 ENCOUNTER — Ambulatory Visit (INDEPENDENT_AMBULATORY_CARE_PROVIDER_SITE_OTHER): Payer: 59 | Admitting: Internal Medicine

## 2022-09-23 VITALS — BP 126/78 | HR 93 | Ht 64.0 in | Wt 225.4 lb

## 2022-09-23 DIAGNOSIS — M48061 Spinal stenosis, lumbar region without neurogenic claudication: Secondary | ICD-10-CM

## 2022-09-23 DIAGNOSIS — M5416 Radiculopathy, lumbar region: Secondary | ICD-10-CM | POA: Diagnosis not present

## 2022-09-23 DIAGNOSIS — M47816 Spondylosis without myelopathy or radiculopathy, lumbar region: Secondary | ICD-10-CM

## 2022-09-23 NOTE — Progress Notes (Signed)
Established Patient Office Visit  Subjective   Patient ID: Erin Murray, female    DOB: [birth year]  Age: 52 y.o. MRN: [id number]  Chief Complaint  Patient presents with   Hip Injury    Follow up from hip and low back pain.    Fall    Been falling at least once a week   Erin Murray returns to care today for follow-up.  She was last evaluated by me through acute video encounter on 1/15 for right hip/knee pain.  In the interim she underwent EGD and screening colonoscopy in early February.  2 polyps were removed, negative for concerning features. Mild gastritis noted on EGD.  Repeat colonoscopy recommended for 5 years.  There have otherwise been no acute interval events.  Today Erin Murray's chief concern is persistent, worsening lower back and right hip pain.  She states that she has been falling recently as her legs give out on her.  She endorses increased weakness and numbness/tingling in her lower extremities.  She notes a chronic history of neuropathy in her feet, however she states that she has had symptoms radiating proximally up to her knees.  She denies fever/chills, unintentional weight loss, night sweats, saddle anesthesia, and incontinence of bowel/bladder.  She is interested in a referral to orthopedic surgery for further evaluation.   Past Medical History:  Diagnosis Date   Complicated migraine    includes: visual changes, left sided weakness, confusion, weakness, dizziness   Dysrhythmia    Fibromyalgia    Fibromyalgia    GERD (gastroesophageal reflux disease)    Headache(784.0)    Hypertension    Neurological disorder    unnamed neurological disorder   Reflux    Seizures ([place])    Tachycardia    TIA (transient ischemic attack) 07/15/2008   Past Surgical History:  Procedure Laterality Date   BIOPSY  08/19/2022   Procedure: BIOPSY;  Surgeon: Eloise Harman, DO;  [facility];  Service: Endoscopy;;   CHOLECYSTECTOMY     COLONOSCOPY     2015 per patient. done  in [place], [place].   COLONOSCOPY WITH PROPOFOL N/A 08/19/2022   Procedure: COLONOSCOPY WITH PROPOFOL;  Surgeon: Eloise Harman, DO;  [facility];  Service: Endoscopy;  Laterality: N/A;  10:15am, asa 3   ESOPHAGOGASTRODUODENOSCOPY N/A 10/14/2014   Procedure: ESOPHAGOGASTRODUODENOSCOPY (EGD);  Surgeon: Rogene Houston, MD;  [facility];  Service: Endoscopy;  Laterality: N/A;  210 - moved to 12:45 - Ann to notify pt   ESOPHAGOGASTRODUODENOSCOPY (EGD) WITH PROPOFOL N/A 08/19/2022   Procedure: ESOPHAGOGASTRODUODENOSCOPY (EGD) WITH PROPOFOL;  Surgeon: Eloise Harman, DO;  [facility];  Service: Endoscopy;  Laterality: N/A;   OVARIAN CYST DRAINAGE     POLYPECTOMY  08/19/2022   Procedure: POLYPECTOMY;  Surgeon: Eloise Harman, DO;  [facility];  Service: Endoscopy;;   TUBAL LIGATION     Social History   Tobacco Use   Smoking status: Never    Passive exposure: Never   Smokeless tobacco: Never  Vaping Use   Vaping Use: Never used  Substance Use Topics   Alcohol use: No   Drug use: No   Family History  Problem Relation Age of Onset   High Cholesterol Mother    Asthma Mother    High Cholesterol Father    Heart disease Father    Colon cancer Father 41   Colon cancer Sister    COPD Maternal Grandfather  Allergies  Allergen Reactions   Sulfa Antibiotics Nausea And Vomiting and Other (See Comments)    REACTION: Family members "almost died" taking this medication   Penicillins Hives   Review of Systems  Musculoskeletal:  Positive for back pain (Chronic lumbar back pain) and joint pain (Right hip).  Neurological:  Positive for weakness (Lower extremities).  All other systems reviewed and are negative.     Objective:     BP 126/78   Pulse 93   Ht '5\' 4"'$  ([id number] m)   Wt 225 lb 6.4 oz (102.2 kg)   LMP 08/24/2019   SpO2 94%   BMI 38.69 kg/m  BP Readings from Last 3 Encounters:  09/23/22 126/78  08/19/22 90/76  08/08/22 115/76       Physical Exam Vitals reviewed.  Constitutional:      General: She is not in acute distress.    Appearance: Normal appearance. She is obese. She is not toxic-appearing.  HENT:     Head: Normocephalic and atraumatic.     Right Ear: External ear normal.     Left Ear: External ear normal.     Nose: Nose normal. No congestion or rhinorrhea.     Mouth/Throat:     Mouth: Mucous membranes are moist.     Pharynx: Oropharynx is clear. No oropharyngeal exudate or posterior oropharyngeal erythema.  Eyes:     General: No scleral icterus.    Extraocular Movements: Extraocular movements intact.     Conjunctiva/sclera: Conjunctivae normal.     Pupils: Pupils are equal, round, and reactive to light.  Cardiovascular:     Rate and Rhythm: Normal rate and regular rhythm.     Pulses: Normal pulses.     Heart sounds: Normal heart sounds. No murmur heard.    No friction rub. No gallop.  Pulmonary:     Effort: Pulmonary effort is normal.     Breath sounds: Normal breath sounds. No wheezing, rhonchi or rales.  Abdominal:     General: Abdomen is flat. Bowel sounds are normal. There is no distension.     Palpations: Abdomen is soft.     Tenderness: There is no abdominal tenderness.  Musculoskeletal:        General: Tenderness (TTP over midline aspect of lumbar spine) present. No swelling. Normal range of motion.     Cervical back: Normal range of motion.     Right lower leg: No edema.     Left lower leg: No edema.  Lymphadenopathy:     Cervical: No cervical adenopathy.  Skin:    General: Skin is warm and dry.     Capillary Refill: Capillary refill takes less than 2 seconds.     Coloration: Skin is not jaundiced.  Neurological:     General: No focal deficit present.     Mental Status: She is alert and oriented to person, place, and time.     Sensory: No sensory deficit.     Motor: Weakness (3/5 weakness appreciated in the lower extremities bilaterally) present.     Gait: Gait abnormal.      Deep Tendon Reflexes: Reflexes normal.  Psychiatric:        Mood and Affect: Mood normal.        Behavior: Behavior normal.   Last CBC Lab Results  Component Value Date   WBC 9.0 06/04/2022   HGB 13.0 06/04/2022   HCT 40.5 06/04/2022   MCV 79 06/04/2022   MCH 25.4 (L) 06/04/2022   RDW 14.1 06/04/2022  PLT 381 [id number]   Last metabolic panel Lab Results  Component Value Date   GLUCOSE 94 06/04/2022   NA 139 06/04/2022   K 4.2 06/04/2022   CL 107 (H) 06/04/2022   CO2 18 (L) 06/04/2022   BUN 14 06/04/2022   CREATININE 0.74 06/04/2022   EGFR 98 06/04/2022   CALCIUM 9.5 06/04/2022   PROT 7.4 06/04/2022   ALBUMIN 4.4 06/04/2022   LABGLOB 3.0 06/04/2022   AGRATIO 1.5 06/04/2022   BILITOT <0.2 06/04/2022   ALKPHOS 150 (H) 06/04/2022   AST 16 06/04/2022   ALT 16 06/04/2022   ANIONGAP 6 05/12/2022   Last lipids Lab Results  Component Value Date   CHOL 186 06/04/2022   HDL 35 (L) 06/04/2022   LDLCALC 123 (H) 06/04/2022   TRIG 157 (H) 06/04/2022   CHOLHDL 5.3 (H) 06/04/2022   Last hemoglobin A1c Lab Results  Component Value Date   HGBA1C 6.4 (H) 06/04/2022   Last thyroid functions Lab Results  Component Value Date   TSH 3.860 06/04/2022   Last vitamin D Lab Results  Component Value Date   VD25OH 23.5 (L) 06/04/2022   Last vitamin B12 and Folate Lab Results  Component Value Date   VITAMINB12 335 06/04/2022   FOLATE 9.5 06/04/2022   The 10-year ASCVD risk score (Arnett DK, et al., 2019) is: 2.8%    Assessment & Plan:   Problem List Items Addressed This Visit       Lumbar back pain with radiculopathy affecting lower extremity    She continues to endorse midline lumbar back pain with radicular symptoms present in her lower extremities.  She has been falling recently and has weakness present in both lower extremities on exam.  Of note, she has previously undergone CT of the lumbar spine that showed severe facet arthropathy at L4-L5 with subtle underlying  spondylolisthesis.  Mild spinal stenosis and mild right L4 foraminal stenosis or noted as well.   -Given persistent symptoms in the setting of worsening weakness with recent falls, MRI of the lumbar spine has been ordered today.  I have also placed a referral to orthopedic surgery for further evaluation and management.      Return in about 4 weeks (around 10/21/2022).   Johnette Abraham, MD

## 2022-09-23 NOTE — Patient Instructions (Signed)
It was a pleasure to see you today.  Thank you for giving [place] the opportunity to be involved in your care.  Below is a brief recap of your visit and next steps.  We will plan to see you again in 4 weeks.  Summary MRI lumbar spine ordered today We will follow up in 1 month

## 2022-09-23 NOTE — Assessment & Plan Note (Signed)
She continues to endorse midline lumbar back pain with radicular symptoms present in her lower extremities.  She has been falling recently and has weakness present in both legs on exam.  Of note, she has previously undergone CT of the lumbar spine that showed severe facet arthropathy at L4-L5 with subtle underlying spondylolisthesis.  Mild spinal stenosis and mild right L4 foraminal stenosis or noted as well.   -Given persistent symptoms in the setting of worsening weakness with recent falls, MRI of the lumbar spine has been ordered today.  I have also placed a referral to orthopedic surgery for further evaluation and management.

## 2022-09-24 ENCOUNTER — Other Ambulatory Visit (HOSPITAL_COMMUNITY): Payer: Self-pay | Admitting: Internal Medicine

## 2022-09-24 DIAGNOSIS — Z1231 Encounter for screening mammogram for malignant neoplasm of breast: Secondary | ICD-10-CM

## 2022-09-26 ENCOUNTER — Inpatient Hospital Stay (HOSPITAL_COMMUNITY): Admission: RE | Admit: 2022-09-26 | Payer: 59 | Source: Ambulatory Visit

## 2022-09-26 DIAGNOSIS — Z1231 Encounter for screening mammogram for malignant neoplasm of breast: Secondary | ICD-10-CM

## 2022-10-02 ENCOUNTER — Other Ambulatory Visit (HOSPITAL_COMMUNITY): Payer: Self-pay | Admitting: Internal Medicine

## 2022-10-02 DIAGNOSIS — Z1231 Encounter for screening mammogram for malignant neoplasm of breast: Secondary | ICD-10-CM

## 2022-10-07 ENCOUNTER — Ambulatory Visit (HOSPITAL_COMMUNITY)
Admission: RE | Admit: 2022-10-07 | Discharge: 2022-10-07 | Disposition: A | Discharge status: Home / Self Care | Payer: 59 | Source: Ambulatory Visit | Attending: Internal Medicine | Admitting: Internal Medicine

## 2022-10-07 ENCOUNTER — Encounter (HOSPITAL_COMMUNITY): Payer: Self-pay

## 2022-10-07 DIAGNOSIS — Z1231 Encounter for screening mammogram for malignant neoplasm of breast: Secondary | ICD-10-CM | POA: Diagnosis not present

## 2022-10-08 ENCOUNTER — Other Ambulatory Visit (HOSPITAL_COMMUNITY): Payer: Self-pay | Admitting: Internal Medicine

## 2022-10-08 ENCOUNTER — Inpatient Hospital Stay
Admission: RE | Admit: 2022-10-08 | Discharge: 2022-10-08 | Disposition: A | Discharge status: Home / Self Care | Payer: Self-pay | Source: Ambulatory Visit | Attending: Internal Medicine | Admitting: Internal Medicine

## 2022-10-08 DIAGNOSIS — Z1231 Encounter for screening mammogram for malignant neoplasm of breast: Secondary | ICD-10-CM

## 2022-10-10 ENCOUNTER — Ambulatory Visit (HOSPITAL_COMMUNITY): Payer: 59

## 2022-10-15 DIAGNOSIS — M7500 Adhesive capsulitis of unspecified shoulder: Secondary | ICD-10-CM | POA: Insufficient documentation

## 2022-10-15 DIAGNOSIS — M19011 Primary osteoarthritis, right shoulder: Secondary | ICD-10-CM | POA: Insufficient documentation

## 2022-10-15 DIAGNOSIS — M797 Fibromyalgia: Secondary | ICD-10-CM | POA: Insufficient documentation

## 2022-10-15 DIAGNOSIS — M12519 Traumatic arthropathy, unspecified shoulder: Secondary | ICD-10-CM | POA: Insufficient documentation

## 2022-10-15 DIAGNOSIS — N852 Hypertrophy of uterus: Secondary | ICD-10-CM | POA: Insufficient documentation

## 2022-10-15 DIAGNOSIS — N83201 Unspecified ovarian cyst, right side: Secondary | ICD-10-CM | POA: Insufficient documentation

## 2022-10-15 DIAGNOSIS — D500 Iron deficiency anemia secondary to blood loss (chronic): Secondary | ICD-10-CM | POA: Insufficient documentation

## 2022-10-15 DIAGNOSIS — L680 Hirsutism: Secondary | ICD-10-CM | POA: Insufficient documentation

## 2022-10-17 ENCOUNTER — Encounter: Payer: Self-pay | Admitting: Orthopedic Surgery

## 2022-10-17 ENCOUNTER — Ambulatory Visit: Payer: 59 | Admitting: Orthopedic Surgery

## 2022-10-17 ENCOUNTER — Other Ambulatory Visit (INDEPENDENT_AMBULATORY_CARE_PROVIDER_SITE_OTHER): Payer: 59

## 2022-10-17 VITALS — BP 130/87 | HR 98 | Ht 64.0 in | Wt 230.0 lb

## 2022-10-17 DIAGNOSIS — Z9181 History of falling: Secondary | ICD-10-CM

## 2022-10-17 DIAGNOSIS — M545 Low back pain, unspecified: Secondary | ICD-10-CM | POA: Diagnosis not present

## 2022-10-17 MED ORDER — MELOXICAM 7.5 MG PO TABS: 7.5000 mg | ORAL_TABLET | Freq: Every day | ORAL | 0 refills | Status: DC

## 2022-10-17 NOTE — Progress Notes (Signed)
Office Visit Note   Patient: Erin Murray           Date of Birth: 05/07/1971           MRN: [id number] Visit Date: 10/17/2022 Requested by: Johnette Abraham, MD [address] [place],  [place] [id number] PCP: Johnette Abraham, MD  Subjective: Chief Complaint  Patient presents with   Back Pain    Pt had a fall DOI 06/20/22, where she landed on her backside. Since she has been seen by her PCP, but pain stays in lower back and both hips.     HPI: 52 year old female seen by primary care MRI ordered but not done yet presents with back pain.  She has always had some back pain but nothing too serious but after a fall recently the back pain increased and it is interfering with her ability to do her job.  Most of the pain is in the lower back across the waistline radiates to both hips and she is getting some new numbness in the left foot  Dr. Doren Custard ordered an MRI but it was canceled and ordered at another facility not completed as of this time              Review of Systems  Constitutional:  Negative for chills, fever and weight loss.  Respiratory:  Negative for shortness of breath.   Cardiovascular:  Negative for chest pain.  Neurological:  Negative for tingling.     Assessment & Plan:  Images personally read and my interpretation : Lumbar spine imaging in the office facet arthritis spondylosis moderate L4-5 L5-1 may be even a little at L3-4  Visit Diagnoses:  1. Lumbar back pain   2. History of recent fall     Plan: Continue meloxicam, the patient is already on Robaxin and Flexeril for tremors.  The patient will call us when she gets her MRI done and we can make recommendations after that  Follow-Up Instructions: Return for Call the office after the MRI then Dr. Aline Brochure;  will read it and call you.   Orders:  Meds ordered this encounter  Medications   meloxicam (MOBIC) 7.5 MG tablet    Sig: Take 1 tablet (7.5 mg total) by mouth daily.    Dispense:  30 tablet    Refill:   0      Objective: Vital Signs: BP 130/87   Pulse 98   Ht 5\' 4"  (1.626 m)   Wt 230 lb (104.3 kg)   LMP 08/24/2019   BMI 39.48 kg/m   Physical Exam Vitals and nursing note reviewed.  Constitutional:      Appearance: Normal appearance.  HENT:     Head: Normocephalic and atraumatic.  Eyes:     General: No scleral icterus.       Right eye: No discharge.        Left eye: No discharge.     Extraocular Movements: Extraocular movements intact.     Conjunctiva/sclera: Conjunctivae normal.     Pupils: Pupils are equal, round, and reactive to light.  Cardiovascular:     Rate and Rhythm: Normal rate.     Pulses: Normal pulses.  Musculoskeletal:     Lumbar back: Positive left straight leg raise test.  Skin:    General: Skin is warm and dry.     Capillary Refill: Capillary refill takes less than 2 seconds.  Neurological:     General: No focal deficit present.  Mental Status: She is alert and oriented to person, place, and time.  Psychiatric:        Mood and Affect: Mood normal.        Behavior: Behavior normal.        Thought Content: Thought content normal.        Judgment: Judgment normal.      Back Exam   Tenderness  The patient is experiencing tenderness in the lumbar (Medial lateral and midline along the lower segments of the spine).  Tests  Straight leg raise left: positiveLeft straight leg raise test: Seated position.  Other  Gait: normal  Erythema: no back redness Scars: absent  Comments:  Dorsiflexion plantarflexion normal toe extension normal both sides in terms of strength       Specialty Comments:  No specialty comments available.  Imaging: DG Lumbar Spine 2-3 Views  Result Date: 10/17/2022 X-ray report Chief complaint pain Images spinal views x 3 Reading: Mild coronal plane malalignment without scoliosis facet arthritis L4-5, L5-S1 may be some at L3-4 Impression: Spondylosis    PMFS History: Patient Active Problem List   Diagnosis Date  Noted   Fibromyalgia 10/15/2022   Iron deficiency anemia due to chronic blood loss 10/15/2022   Arthritis of right acromioclavicular joint 10/15/2022   Adhesive capsulitis of shoulder 10/15/2022   Cyst of right ovary 10/15/2022   Enlarged uterus 10/15/2022   Hirsutism 10/15/2022   Traumatic arthropathy of the shoulder region 10/15/2022   Lumbar back pain with radiculopathy affecting lower extremity 09/23/2022   History of recent fall 06/26/2022   Tachycardia 06/04/2022   Bilateral leg paresthesia 06/04/2022   Encounter for general adult medical examination with abnormal findings 06/04/2022   Left upper quadrant abdominal pain 06/04/2022   Sprain of wrist joint, left, subsequent encounter 10/11/2021   Hand trauma, left, subsequent encounter 10/11/2021   Acid reflux 12/27/2020   Insomnia 12/27/2020   Peripheral nerve disease 12/27/2020   Hypertension 02/06/2019   Abnormal uterine bleeding (AUB) 04/01/2018   Reflux    Headache(784.0)    TIA (transient ischemic attack)    Seizures    Migraine    Past Medical History:  Diagnosis Date   Complicated migraine    includes: visual changes, left sided weakness, confusion, weakness, dizziness   Dysrhythmia    Fibromyalgia    Fibromyalgia    GERD (gastroesophageal reflux disease)    Headache(784.0)    Hypertension    Neurological disorder    unnamed neurological disorder   Reflux    Seizures    Tachycardia    TIA (transient ischemic attack) 07/15/2008    Family History  Problem Relation Age of Onset   High Cholesterol Mother    Asthma Mother    High Cholesterol Father    Heart disease Father    Colon cancer Father 5   Colon cancer Sister    Breast cancer Sister    COPD Maternal Grandfather     Past Surgical History:  Procedure Laterality Date   BIOPSY  08/19/2022   Procedure: BIOPSY;  Surgeon: Eloise Harman, DO;  [facility];  Service: Endoscopy;;   CHOLECYSTECTOMY     COLONOSCOPY     2015 per patient.  done in [place], [place].   COLONOSCOPY WITH PROPOFOL N/A 08/19/2022   Procedure: COLONOSCOPY WITH PROPOFOL;  Surgeon: Eloise Harman, DO;  [facility];  Service: Endoscopy;  Laterality: N/A;  10:15am, asa 3   ESOPHAGOGASTRODUODENOSCOPY N/A 10/14/2014   Procedure: ESOPHAGOGASTRODUODENOSCOPY (  EGD);  Surgeon: Rogene Houston, MD;  [facility];  Service: Endoscopy;  Laterality: N/A;  210 - moved to 12:45 - Ann to notify pt   ESOPHAGOGASTRODUODENOSCOPY (EGD) WITH PROPOFOL N/A 08/19/2022   Procedure: ESOPHAGOGASTRODUODENOSCOPY (EGD) WITH PROPOFOL;  Surgeon: Eloise Harman, DO;  [facility];  Service: Endoscopy;  Laterality: N/A;   OVARIAN CYST DRAINAGE     POLYPECTOMY  08/19/2022   Procedure: POLYPECTOMY;  Surgeon: Eloise Harman, DO;  [facility];  Service: Endoscopy;;   TUBAL LIGATION     Social History   Occupational History   Occupation: day care  Tobacco Use   Smoking status: Never    Passive exposure: Never   Smokeless tobacco: Never  Vaping Use   Vaping Use: Never used  Substance and Sexual Activity   Alcohol use: No   Drug use: No   Sexual activity: Yes    Birth control/protection: Surgical

## 2022-10-21 ENCOUNTER — Ambulatory Visit: Payer: 59 | Admitting: Internal Medicine

## 2022-10-24 ENCOUNTER — Telehealth: Payer: Self-pay | Admitting: Internal Medicine

## 2022-10-24 NOTE — Telephone Encounter (Signed)
Pt wants call back in regard to MRI referral

## 2022-10-24 NOTE — Telephone Encounter (Signed)
Tried calling pt and no answer. 

## 2022-10-25 NOTE — Telephone Encounter (Signed)
Patient can not have MRI done at a hospital has to be done somewhere else.

## 2022-10-25 NOTE — Telephone Encounter (Signed)
LVM

## 2022-11-12 ENCOUNTER — Other Ambulatory Visit: Payer: Self-pay | Admitting: Orthopedic Surgery

## 2022-11-12 DIAGNOSIS — Z9181 History of falling: Secondary | ICD-10-CM

## 2022-11-17 NOTE — Progress Notes (Deleted)
GI Office Note    Referring Provider: Billie Lade, MD Primary Care Physician:  Billie Lade, MD Primary Gastroenterologist: Hennie Duos. Marletta Lor, DO  Date:  11/17/2022  ID:  Erin Murray, DOB 1971-07-07, MRN [id number]   Chief Complaint   No chief complaint on file.  History of Present Illness  Erin Murray is a 52 y.o. female with a history of *** presenting today for follow up post procedures.   Colonoscopy 2011: -normal colonoscopy -advised repeat in 5 years   CT A/P with contrast 02/17/2022: -Stable mild biliary prominence status post cholecystectomy -Normal pancreas, spleen, adrenal glands -No bowel wall thickening or inflammatory changes -Small uterine fundal fibroid no adnexal mass  Last office visit 08/08/22. *** Scheduled for EGD and colonoscopy. PPI BID, famotidine as needed. Start fiber supplement and miralax every 2-3 days.   EGD 08/23/22: -***  Colonoscopy 08/23/22: -  Today:     Current Outpatient Medications  Medication Sig Dispense Refill   aspirin EC 81 MG tablet Take 81 mg by mouth daily.     cyclobenzaprine (FLEXERIL) 10 MG tablet Take 1 tablet (10 mg total) by mouth 3 (three) times daily as needed for muscle spasms. Do not drink alcohol or drive while taking this medication.  May cause drowsiness. 15 tablet 0   fluticasone (FLONASE) 50 MCG/ACT nasal spray Place 1 spray into both nostrils 2 (two) times daily. 16 g 2   melatonin 5 MG TABS Take 20 mg by mouth at bedtime as needed.     meloxicam (MOBIC) 7.5 MG tablet Take 1 tablet by mouth once daily 30 tablet 0   metoprolol tartrate (LOPRESSOR) 50 MG tablet Take 1 tablet by mouth twice daily 60 tablet 0   nitroGLYCERIN (NITROSTAT) 0.4 MG SL tablet Place 1 tablet (0.4 mg total) under the tongue every 5 (five) minutes as needed for chest pain. 25 tablet 3   pantoprazole (PROTONIX) 40 MG tablet Take 1 tablet by mouth twice daily 60 tablet 0   rizatriptan (MAXALT-MLT) 10 MG disintegrating tablet Take 1  tablet (10 mg total) by mouth as needed for migraine. May repeat in 2 hours if needed 15 tablet 6   topiramate (TOPAMAX) 50 MG tablet Take 2 tablets (100 mg total) by mouth 2 (two) times daily. One po bid xone week, then 2 tabs po bid 120 tablet 12   vitamin B-12 (CYANOCOBALAMIN) 1000 MCG tablet Take 5,000 mcg by mouth daily.     No current facility-administered medications for this visit.    Past Medical History:  Diagnosis Date   Complicated migraine    includes: visual changes, left sided weakness, confusion, weakness, dizziness   Dysrhythmia    Fibromyalgia    Fibromyalgia    GERD (gastroesophageal reflux disease)    Headache(784.0)    Hypertension    Neurological disorder    unnamed neurological disorder   Reflux    Seizures ([facility])    Tachycardia    TIA (transient ischemic attack) 07/15/2008    Past Surgical History:  Procedure Laterality Date   BIOPSY  08/19/2022   Procedure: BIOPSY;  Surgeon: Lanelle Bal, DO;  [facility];  Service: Endoscopy;;   CHOLECYSTECTOMY     COLONOSCOPY     2015 per patient. done in [place], [place].   COLONOSCOPY WITH PROPOFOL N/A 08/19/2022   Procedure: COLONOSCOPY WITH PROPOFOL;  Surgeon: Lanelle Bal, DO;  [facility];  Service: Endoscopy;  Laterality: N/A;  10:15am, asa 3  ESOPHAGOGASTRODUODENOSCOPY N/A 10/14/2014   Procedure: ESOPHAGOGASTRODUODENOSCOPY (EGD);  Surgeon: Malissa Hippo, MD;  [facility];  Service: Endoscopy;  Laterality: N/A;  210 - moved to 12:45 - Ann to notify pt   ESOPHAGOGASTRODUODENOSCOPY (EGD) WITH PROPOFOL N/A 08/19/2022   Procedure: ESOPHAGOGASTRODUODENOSCOPY (EGD) WITH PROPOFOL;  Surgeon: Lanelle Bal, DO;  [facility];  Service: Endoscopy;  Laterality: N/A;   OVARIAN CYST DRAINAGE     POLYPECTOMY  08/19/2022   Procedure: POLYPECTOMY;  Surgeon: Lanelle Bal, DO;  [facility];  Service: Endoscopy;;   TUBAL LIGATION      Family History  Problem  Relation Age of Onset   High Cholesterol Mother    Asthma Mother    High Cholesterol Father    Heart disease Father    Colon cancer Father 58   Colon cancer Sister    Breast cancer Sister    COPD Maternal Grandfather     Allergies as of 11/18/2022 - Review Complete 10/17/2022  Allergen Reaction Noted   Sulfa antibiotics Nausea And Vomiting and Other (See Comments) 02/20/2012   Penicillins Hives 02/20/2012    Social History   Socioeconomic History   Marital status: Married    Spouse name: Dulce Sellar   Number of children: 3   Years of education: Not on file   Highest education level: Not on file  Occupational History   Occupation: day care  Tobacco Use   Smoking status: Never    Passive exposure: Never   Smokeless tobacco: Never  Vaping Use   Vaping Use: Never used  Substance and Sexual Activity   Alcohol use: No   Drug use: No   Sexual activity: Yes    Birth control/protection: Surgical  Other Topics Concern   Not on file  Social History Narrative   Patient lives at home with her husband Dulce Sellar), three children. Patient works at at a day care. Patient has three children. 29 years old.   Left handed.   Caffeine two cups of coffee daily   Social Determinants of Health   Financial Resource Strain: Not on file  Food Insecurity: Not on file  Transportation Needs: Not on file  Physical Activity: Not on file  Stress: Not on file  Social Connections: Not on file     Review of Systems   Gen: Denies fever, chills, anorexia. Denies fatigue, weakness, weight loss.  CV: Denies chest pain, palpitations, syncope, peripheral edema, and claudication. Resp: Denies dyspnea at rest, cough, wheezing, coughing up blood, and pleurisy. GI: See HPI Derm: Denies rash, itching, dry skin Psych: Denies depression, anxiety, memory loss, confusion. No homicidal or suicidal ideation.  Heme: Denies bruising, bleeding, and enlarged lymph nodes.   Physical Exam   LMP 08/24/2019    General:   Alert and oriented. No distress noted. Pleasant and cooperative.  Head:  Normocephalic and atraumatic. Eyes:  Conjuctiva clear without scleral icterus. Mouth:  Oral mucosa pink and moist. Good dentition. No lesions. Lungs:  Clear to auscultation bilaterally. No wheezes, rales, or rhonchi. No distress.  Heart:  S1, S2 present without murmurs appreciated.  Abdomen:  +BS, soft, non-tender and non-distended. No rebound or guarding. No HSM or masses noted. Rectal: *** Msk:  Symmetrical without gross deformities. Normal posture. Extremities:  Without edema. Neurologic:  Alert and  oriented x4 Psych:  Alert and cooperative. Normal mood and affect.   Assessment  Erin Murray is a 52 y.o. female with a history of *** presenting today  with   History of colon polyps, family history of colon cancer:   LUQ pain, gastritis:   Constipation:   PLAN   ***     Brooke Bonito, MSN, FNP-BC, AGACNP-BC [facility]

## 2022-11-18 ENCOUNTER — Ambulatory Visit: Payer: 59 | Admitting: Gastroenterology

## 2022-11-18 ENCOUNTER — Encounter: Payer: Self-pay | Admitting: Gastroenterology

## 2022-12-23 ENCOUNTER — Other Ambulatory Visit: Payer: Self-pay

## 2022-12-23 ENCOUNTER — Emergency Department (HOSPITAL_COMMUNITY)
Admission: EM | Admit: 2022-12-23 | Discharge: 2022-12-23 | Disposition: A | Discharge status: Home / Self Care | Payer: 59 | Attending: Emergency Medicine | Admitting: Emergency Medicine

## 2022-12-23 ENCOUNTER — Emergency Department (HOSPITAL_COMMUNITY): Payer: 59

## 2022-12-23 DIAGNOSIS — S8264XA Nondisplaced fracture of lateral malleolus of right fibula, initial encounter for closed fracture: Secondary | ICD-10-CM | POA: Insufficient documentation

## 2022-12-23 DIAGNOSIS — X501XXA Overexertion from prolonged static or awkward postures, initial encounter: Secondary | ICD-10-CM | POA: Insufficient documentation

## 2022-12-23 MED ORDER — HYDROCODONE-ACETAMINOPHEN 5-325 MG PO TABS: 1.0000 | ORAL_TABLET | ORAL | 0 refills | Status: DC | PRN

## 2022-12-23 MED ORDER — IBUPROFEN 800 MG PO TABS: 800.0000 mg | ORAL_TABLET | Freq: Three times a day (TID) | ORAL | 0 refills | Status: DC

## 2022-12-23 NOTE — ED Provider Notes (Signed)
[place] EMERGENCY DEPARTMENT AT [facility] Provider Note   CSN: [id number] Arrival date & time: 12/23/22  1930     History  Chief Complaint  Patient presents with   Foot Pain    Erin Murray is a 52 y.o. female.   Foot Pain Pertinent negatives include no chest pain and no abdominal pain.       Erin Murray is a 52 y.o. female who presents to the Emergency Department complaining of pain and swelling of her lateral right ankle.  States that several days ago she stepped in a hole causing a twisting injury of her ankle.  She did not fall.  She has continued to walk on the ankle at her job.  She wore heels last evening to a wedding and after returning home felt a pop in her ankle with increased pain and swelling since that time.  Describes pain as throbbing.  Denies any numbness of her toes or pain radiating up her leg.  Home Medications Prior to Admission medications   Medication Sig Start Date End Date Taking? Authorizing Provider  HYDROcodone-acetaminophen (NORCO/VICODIN) 5-325 MG tablet Take 1 tablet by mouth every 4 (four) hours as needed. 12/23/22  Yes Arya Boxley, PA-C  ibuprofen (ADVIL) 800 MG tablet Take 1 tablet (800 mg total) by mouth 3 (three) times daily. Take with food 12/23/22  Yes Janaia Kozel, PA-C  aspirin EC 81 MG tablet Take 81 mg by mouth daily.    [provider]  cyclobenzaprine (FLEXERIL) 10 MG tablet Take 1 tablet (10 mg total) by mouth 3 (three) times daily as needed for muscle spasms. Do not drink alcohol or drive while taking this medication.  May cause drowsiness. 06/20/22   Particia Nearing, PA-C  fluticasone [facility]) 50 MCG/ACT nasal spray Place 1 spray into both nostrils 2 (two) times daily. 07/23/22   Particia Nearing, PA-C  melatonin 5 MG TABS Take 20 mg by mouth at bedtime as needed.    [provider]  metoprolol tartrate (LOPRESSOR) 50 MG tablet Take 1 tablet by mouth twice daily 09/02/22   Billie Lade, MD  nitroGLYCERIN (NITROSTAT) 0.4 MG SL tablet Place 1 tablet (0.4 mg total) under the tongue every 5 (five) minutes as needed for chest pain. 08/26/19 09/23/22  Laqueta Linden, MD  pantoprazole (PROTONIX) 40 MG tablet Take 1 tablet by mouth twice daily 09/17/22   Billie Lade, MD  rizatriptan (MAXALT-MLT) 10 MG disintegrating tablet Take 1 tablet (10 mg total) by mouth as needed for migraine. May repeat in 2 hours if needed 06/04/22   Billie Lade, MD  topiramate (TOPAMAX) 50 MG tablet Take 2 tablets (100 mg total) by mouth 2 (two) times daily. One po bid xone week, then 2 tabs po bid 06/04/22   Billie Lade, MD  vitamin B-12 (CYANOCOBALAMIN) 1000 MCG tablet Take 5,000 mcg by mouth daily.    [provider]      Allergies    Sulfa antibiotics and Penicillins    Review of Systems   Review of Systems  Constitutional:  Negative for appetite change, chills and fever.  Cardiovascular:  Negative for chest pain.  Gastrointestinal:  Negative for abdominal pain, diarrhea and vomiting.  Musculoskeletal:  Positive for arthralgias (Right ankle pain and swelling) and joint swelling.  Skin:  Negative for color change and wound.  Neurological:  Negative for weakness and numbness.    Physical Exam Updated Vital Signs BP 128/78 (BP  Location: Right Arm)   Pulse 93   Temp 98.5 F (36.9 C) (Oral)   Resp 20   LMP 08/24/2019   SpO2 94%  Physical Exam Vitals and nursing note reviewed.  Constitutional:      General: She is not in acute distress.    Appearance: Normal appearance. She is not toxic-appearing.  Cardiovascular:     Rate and Rhythm: Normal rate and regular rhythm.     Pulses: Normal pulses.  Pulmonary:     Effort: Pulmonary effort is normal.  Musculoskeletal:        General: Swelling, tenderness and signs of injury present.     Right ankle: Swelling present. Tenderness present over the lateral malleolus. Decreased range of motion.     Comments: Focal  tenderness to palpation over the lateral malleolus.  Mild to moderate edema noted.  No bony deformity noted.  No proximal tenderness compartments are soft.  Skin:    General: Skin is warm.     Capillary Refill: Capillary refill takes less than 2 seconds.  Neurological:     General: No focal deficit present.     Mental Status: She is alert.     Sensory: No sensory deficit.     Motor: No weakness.     ED Results / Procedures / Treatments   Labs (all labs ordered are listed, but only abnormal results are displayed) Labs Reviewed - No data to display  EKG None  Radiology DG Ankle Complete Right  Result Date: 12/23/2022 CLINICAL DATA:  Rolled right ankle.  Worsening pain. EXAM: RIGHT ANKLE - COMPLETE 3+ VIEW COMPARISON:  None Available. FINDINGS: There is subtle cortical irregularity along the lateral aspect of the lateral malleolar cortex suspicious for nondisplaced fracture. Bony alignment is normal. The ankle mortise is intact. The joint spaces are preserved. There is no erosive change. There is mild inferior calcaneal spurring and Achilles enthesopathy. There is soft tissue swelling over the lateral malleolus. IMPRESSION: Suspected nondisplaced fracture of the lateral malleolus with overlying soft tissue swelling. Electronically Signed   By: Lesia Hausen M.D.   On: 12/23/2022 20:32    Procedures Procedures    Medications Ordered in ED Medications - No data to display  ED Course/ Medical Decision Making/ A&P                             Medical Decision Making Patient here for evaluation of right ankle pain after a mechanical injury.  I suspect musculoskeletal injury, fracture and dislocation also considered but felt less likely.  Amount and/or Complexity of Data Reviewed Radiology: ordered.    Details: X-ray of the ankle shows suspected nondisplaced fracture over the lateral malleolus Discussion of management or test interpretation with external provider(s): Patient placed  in short cam boot as she is employed at a daycare and cannot use crutches.  Agreeable to symptomatic treatment with elevation and ice, prescription written for ibuprofen and short course of pain medication also provided.  Will follow-up locally with orthopedics.  Risk Prescription drug management.           Final Clinical Impression(s) / ED Diagnoses Final diagnoses:  Closed nondisplaced fracture of lateral malleolus of right fibula, initial encounter    Rx / DC Orders ED Discharge Orders          Ordered    ibuprofen (ADVIL) 800 MG tablet  3 times daily        12/23/22 2308  HYDROcodone-acetaminophen (NORCO/VICODIN) 5-325 MG tablet  Every 4 hours PRN        12/23/22 2309              Pauline Aus, PA-C 12/23/22 2331    Bethann Berkshire, MD 12/24/22 1129

## 2022-12-23 NOTE — Discharge Instructions (Signed)
Elevate and apply ice packs on and off to ankle for swelling.  Use the boot for walking or standing.  You may remove at bedtime and for bathing.  Call the orthopedic provider listed to arrange follow-up appointment.

## 2022-12-23 NOTE — ED Triage Notes (Signed)
Twisted her right ankle walking her dog a few days ago.  Has been progressively gotten worse.  Went to her son's wedding yesterday and wore dress shoes.  States she felt a pop when she got into bed last night and has now began swelling.

## 2022-12-24 ENCOUNTER — Telehealth: Payer: Self-pay

## 2022-12-24 MED FILL — Hydrocodone-Acetaminophen Tab 5-325 MG: ORAL | Qty: 6 | Status: AC

## 2022-12-24 NOTE — Transitions of Care (Post Inpatient/ED Visit) (Signed)
12/24/2022  Name: Erin Murray MRN: [id number] DOB: Nov 13, 1970  Today's TOC FU Call Status: Today's TOC FU Call Status:: Successful TOC FU Call Competed TOC FU Call Complete Date: 12/24/22  Transition Care Management Follow-up Telephone Call Date of Discharge: 12/23/22 [facility] Type of Discharge: Emergency Department Reason for ED Visit: Other:, Orthopedic Conditions Orthopedic/Injury Diagnosis: Sprain or Strain How have you been since you were released from the hospital?: Better Any questions or concerns?: No  Items Reviewed: Did you receive and understand the discharge instructions provided?: Yes Medications obtained,verified, and reconciled?: Yes (Medications Reviewed) Any new allergies since your discharge?: No Dietary orders reviewed?: NA Do you have support at home?: Yes People in Home: spouse  Medications Reviewed Today: Medications Reviewed Today     Reviewed by Baird Kay, LPN (Licensed Practical Nurse) on 10/17/22 at 1628  Med List Status: <None>   Medication Order Taking? Sig Documenting Provider Last Dose Status Informant  aspirin EC 81 MG tablet [id number] No Take 81 mg by mouth daily. [provider] Taking Active   cyclobenzaprine (FLEXERIL) 10 MG tablet [id number] No Take 1 tablet (10 mg total) by mouth 3 (three) times daily as needed for muscle spasms. Do not drink alcohol or drive while taking this medication.  May cause drowsiness. Particia Nearing, [place] Taking Active   fluticasone [facility]) 50 MCG/ACT nasal spray [id number] No Place 1 spray into both nostrils 2 (two) times daily. Particia Nearing, [place] Taking Active   melatonin 5 MG TABS [id number] No Take 20 mg by mouth at bedtime as needed. [provider] Taking Active   meloxicam (MOBIC) 7.5 MG tablet [id number] No Take 1 tablet by mouth once daily Billie Lade, MD Taking Active   metoprolol tartrate (LOPRESSOR) 50 MG tablet [id number] No Take  1 tablet by mouth twice daily Billie Lade, MD Taking Active   nitroGLYCERIN (NITROSTAT) 0.4 MG SL tablet [id number] No Place 1 tablet (0.4 mg total) under the tongue every 5 (five) minutes as needed for chest pain. Laqueta Linden, MD Taking Expired 09/23/22 2359   pantoprazole (PROTONIX) 40 MG tablet [id number] No Take 1 tablet by mouth twice daily Billie Lade, MD Taking Active   rizatriptan (MAXALT-MLT) 10 MG disintegrating tablet [id number] No Take 1 tablet (10 mg total) by mouth as needed for migraine. May repeat in 2 hours if needed Billie Lade, MD Taking Active   topiramate (TOPAMAX) 50 MG tablet [id number] No Take 2 tablets (100 mg total) by mouth 2 (two) times daily. One po bid xone week, then 2 tabs po bid Billie Lade, MD Taking Active   vitamin B-12 (CYANOCOBALAMIN) 1000 MCG tablet [id number] No Take 5,000 mcg by mouth daily. [provider] Taking Active             Home Care and Equipment/Supplies: Were Home Health Services Ordered?: No Any new equipment or medical supplies ordered?: Yes Name of Medical supply agency?: hospital Do you have any questions related to the use of the equipment/supplies?: No  Functional Questionnaire: Do you need assistance with bathing/showering or dressing?: No Do you need assistance with meal preparation?: No Do you need assistance with eating?: No Do you have difficulty maintaining continence: No Do you need assistance with getting out of bed/getting out of a chair/moving?: No Do you have difficulty managing or taking your medications?: No  Follow up appointments reviewed: PCP Follow-up appointment confirmed?: NA Specialist Hospital Follow-up appointment confirmed?: No Reason  Specialist Follow-Up Not Confirmed: Patient has Specialist Provider Number and will Call for Appointment Do you need transportation to your follow-up appointment?: No Do you understand care options if your condition(s) worsen?: Yes-patient  verbalized understanding    SIGNATURE Fredirick Maudlin

## 2023-01-01 ENCOUNTER — Encounter: Payer: Self-pay | Admitting: Internal Medicine

## 2023-01-01 ENCOUNTER — Ambulatory Visit (INDEPENDENT_AMBULATORY_CARE_PROVIDER_SITE_OTHER): Payer: Self-pay | Admitting: Internal Medicine

## 2023-01-01 ENCOUNTER — Telehealth: Payer: Self-pay | Admitting: Internal Medicine

## 2023-01-01 VITALS — BP 131/84 | HR 123 | Ht 66.0 in | Wt 229.6 lb

## 2023-01-01 DIAGNOSIS — S8264XA Nondisplaced fracture of lateral malleolus of right fibula, initial encounter for closed fracture: Secondary | ICD-10-CM | POA: Insufficient documentation

## 2023-01-01 DIAGNOSIS — S8264XD Nondisplaced fracture of lateral malleolus of right fibula, subsequent encounter for closed fracture with routine healing: Secondary | ICD-10-CM

## 2023-01-01 DIAGNOSIS — R7303 Prediabetes: Secondary | ICD-10-CM

## 2023-01-01 DIAGNOSIS — M5416 Radiculopathy, lumbar region: Secondary | ICD-10-CM

## 2023-01-01 DIAGNOSIS — E669 Obesity, unspecified: Secondary | ICD-10-CM

## 2023-01-01 DIAGNOSIS — Z111 Encounter for screening for respiratory tuberculosis: Secondary | ICD-10-CM

## 2023-01-01 DIAGNOSIS — R569 Unspecified convulsions: Secondary | ICD-10-CM

## 2023-01-01 DIAGNOSIS — E559 Vitamin D deficiency, unspecified: Secondary | ICD-10-CM

## 2023-01-01 MED ORDER — MELOXICAM 7.5 MG PO TABS: 7.5000 mg | ORAL_TABLET | Freq: Every day | ORAL | 0 refills | Status: AC

## 2023-01-01 NOTE — Assessment & Plan Note (Signed)
A1c 6.4 on labs from November.  She is not currently taking any medication for this. -Repeat A1c ordered today.  Start appropriate medication pending results.

## 2023-01-01 NOTE — Telephone Encounter (Signed)
Fitness Duty certification form  Copied Noted sleeved

## 2023-01-01 NOTE — Assessment & Plan Note (Signed)
She suffered a fall on 6/10, resulting in fracture of the right lateral malleolus.  Currently wearing a cam walking boot. -Meloxicam refilled today -I have recommended that she contact orthopedic surgery to arrange follow-up

## 2023-01-01 NOTE — Assessment & Plan Note (Signed)
Noted on previous labs.  Not currently on vitamin D supplementation.  Repeat vitamin D level ordered today.

## 2023-01-01 NOTE — Assessment & Plan Note (Addendum)
She has established care with orthopedic surgery.  MRI of the lumbar spine is still pending.  Symptoms are largely unchanged.  She is managing her pain with Flexeril and meloxicam.  Meloxicam has been refilled today.  She will follow-up with orthopedic surgery upon completion of MRI.

## 2023-01-01 NOTE — Assessment & Plan Note (Signed)
She endorses a history of seizures.  Not currently taking any antiepileptic medications.  Previously referred to neurology for evaluation of seizures, migraines, and a history of TIA but has not establish care.  She is now interested in establishing care with neurology.  Last referred in November 2023. -We will follow-up on the status of previously placed neurology referral

## 2023-01-01 NOTE — Assessment & Plan Note (Signed)
BMI 37.  She is potentially interested in starting medication for weight loss. -Repeat labs ordered today -Lifestyle modifications aimed at weight loss were reviewed -Consider appropriate medication based on lab results from today.

## 2023-01-01 NOTE — Patient Instructions (Signed)
It was a pleasure to see you today.  Thank you for giving [place] the opportunity to be involved in your care.  Below is a brief recap of your visit and next steps.  We will plan to see you again in 3 months.  Summary No medication changes today Repeat labs Follow up in 3 months

## 2023-01-01 NOTE — Progress Notes (Signed)
Established Patient Office Visit  Subjective   Patient ID: Erin Murray, female    DOB: 1970/07/30  Age: 52 y.o. MRN: [id number]  Chief Complaint  Patient presents with   Follow-up    Follow up, Paperwork and tb skin test   Erin Murray returns to care today for routine follow-up.  She was last evaluated by me on 3/11.  At that time her acute concern was lumbar back pain.  An MRI of the lumbar spine was ordered due to persistent pain.  She was also referred to orthopedic surgery for further evaluation.  In the interim she has establish care with orthopedic surgery.  MRI is still pending.  She presented to the emergency department on 6/10 endorsing pain and swelling of her right lateral ankle.  X-rays revealed a nondisplaced fracture of the right lateral malleolus.  She was placed in a short cam boot and instructed to follow-up with orthopedic surgery.  There have otherwise been no acute interval events.  Erin Murray reports feeling well today.  She does not have any additional concerns to discuss.  She will be starting a new job soon and has paperwork to be completed as well is needing a TB skin test.   Past Medical History:  Diagnosis Date   Complicated migraine    includes: visual changes, left sided weakness, confusion, weakness, dizziness   Dysrhythmia    Fibromyalgia    Fibromyalgia    GERD (gastroesophageal reflux disease)    Headache(784.0)    Hypertension    Neurological disorder    unnamed neurological disorder   Reflux    Seizures (HCC)    Tachycardia    TIA (transient ischemic attack) 07/15/2008   Past Surgical History:  Procedure Laterality Date   BIOPSY  08/19/2022   Procedure: BIOPSY;  Surgeon: Lanelle Bal, DO;  Location: [facility] ENDO SUITE;  Service: Endoscopy;;   CHOLECYSTECTOMY     COLONOSCOPY     2015 per patient. done in [place], [place].   COLONOSCOPY WITH PROPOFOL N/A 08/19/2022   Procedure: COLONOSCOPY WITH PROPOFOL;  Surgeon: Lanelle Bal, DO;  Location: [facility] ENDO  SUITE;  Service: Endoscopy;  Laterality: N/A;  10:15am, asa 3   ESOPHAGOGASTRODUODENOSCOPY N/A 10/14/2014   Procedure: ESOPHAGOGASTRODUODENOSCOPY (EGD);  Surgeon: Malissa Hippo, MD;  Location: [facility] ENDO SUITE;  Service: Endoscopy;  Laterality: N/A;  210 - moved to 12:45 - Ann to notify pt   ESOPHAGOGASTRODUODENOSCOPY (EGD) WITH PROPOFOL N/A 08/19/2022   Procedure: ESOPHAGOGASTRODUODENOSCOPY (EGD) WITH PROPOFOL;  Surgeon: Lanelle Bal, DO;  Location: [facility];  Service: Endoscopy;  Laterality: N/A;   OVARIAN CYST DRAINAGE     POLYPECTOMY  08/19/2022   Procedure: POLYPECTOMY;  Surgeon: Lanelle Bal, DO;  Location: [facility];  Service: Endoscopy;;   TUBAL LIGATION     Social History   Tobacco Use   Smoking status: Never    Passive exposure: Never   Smokeless tobacco: Never  Vaping Use   Vaping Use: Never used  Substance Use Topics   Alcohol use: No   Drug use: No   Family History  Problem Relation Age of Onset   High Cholesterol Mother    Asthma Mother    High Cholesterol Father    Heart disease Father    Colon cancer Father 29   Colon cancer Sister    Breast cancer Sister    COPD Maternal Grandfather    Allergies  Allergen Reactions   Sulfa Antibiotics Nausea And  Vomiting and Other (See Comments)    REACTION: Family members "almost died" taking this medication   Penicillins Hives   Review of Systems  Musculoskeletal:  Positive for back pain (Chronic lumbar back pain) and joint pain (Right lateral ankle pain).  All other systems reviewed and are negative.    Objective:     BP 131/84   Pulse (!) 123   Ht 5\' 6"  (1.676 m)   Wt 229 lb 9.6 oz (104.1 kg)   LMP 08/24/2019   SpO2 96%   BMI 37.06 kg/m  BP Readings from Last 3 Encounters:  01/01/23 131/84  12/23/22 123/79  10/17/22 130/87   Physical Exam Vitals reviewed.  Constitutional:      General: She is not in acute distress.    Appearance: Normal appearance. She is obese. She is not  toxic-appearing.  HENT:     Head: Normocephalic and atraumatic.     Right Ear: External ear normal.     Left Ear: External ear normal.     Nose: Nose normal. No congestion or rhinorrhea.     Mouth/Throat:     Mouth: Mucous membranes are moist.     Pharynx: Oropharynx is clear. No oropharyngeal exudate or posterior oropharyngeal erythema.  Eyes:     General: No scleral icterus.    Extraocular Movements: Extraocular movements intact.     Conjunctiva/sclera: Conjunctivae normal.     Pupils: Pupils are equal, round, and reactive to light.  Cardiovascular:     Rate and Rhythm: Normal rate and regular rhythm.     Pulses: Normal pulses.     Heart sounds: Normal heart sounds. No murmur heard.    No friction rub. No gallop.  Pulmonary:     Effort: Pulmonary effort is normal.     Breath sounds: Normal breath sounds. No wheezing, rhonchi or rales.  Abdominal:     General: Abdomen is flat. Bowel sounds are normal. There is no distension.     Palpations: Abdomen is soft.     Tenderness: There is no abdominal tenderness.  Musculoskeletal:        General: Signs of injury (Short cam boot on right lower extremity) present. No swelling.     Cervical back: Normal range of motion.     Right lower leg: No edema.     Left lower leg: No edema.  Lymphadenopathy:     Cervical: No cervical adenopathy.  Skin:    General: Skin is warm and dry.     Capillary Refill: Capillary refill takes less than 2 seconds.     Coloration: Skin is not jaundiced.  Neurological:     General: No focal deficit present.     Mental Status: She is alert and oriented to person, place, and time.  Psychiatric:        Mood and Affect: Mood normal.        Behavior: Behavior normal.   Last CBC Lab Results  Component Value Date   WBC 9.0 06/04/2022   HGB 13.0 06/04/2022   HCT 40.5 06/04/2022   MCV 79 06/04/2022   MCH 25.4 (L) 06/04/2022   RDW 14.1 06/04/2022   PLT 381 06/04/2022   Last metabolic panel Lab Results   Component Value Date   GLUCOSE 94 06/04/2022   NA 139 06/04/2022   K 4.2 06/04/2022   CL 107 (H) 06/04/2022   CO2 18 (L) 06/04/2022   BUN 14 06/04/2022   CREATININE 0.74 06/04/2022   EGFR 98 06/04/2022  CALCIUM 9.5 06/04/2022   PROT 7.4 06/04/2022   ALBUMIN 4.4 06/04/2022   LABGLOB 3.0 06/04/2022   AGRATIO 1.5 06/04/2022   BILITOT <0.2 06/04/2022   ALKPHOS 150 (H) 06/04/2022   AST 16 06/04/2022   ALT 16 06/04/2022   ANIONGAP 6 05/12/2022   Last lipids Lab Results  Component Value Date   CHOL 186 06/04/2022   HDL 35 (L) 06/04/2022   LDLCALC 123 (H) 06/04/2022   TRIG 157 (H) 06/04/2022   CHOLHDL 5.3 (H) 06/04/2022   Last hemoglobin A1c Lab Results  Component Value Date   HGBA1C 6.4 (H) 06/04/2022   Last thyroid functions Lab Results  Component Value Date   TSH 3.860 06/04/2022   Last vitamin D Lab Results  Component Value Date   VD25OH 23.5 (L) 06/04/2022   Last vitamin B12 and Folate Lab Results  Component Value Date   VITAMINB12 335 06/04/2022   FOLATE 9.5 06/04/2022   The 10-year ASCVD risk score (Arnett DK, et al., 2019) is: 3.2%    Assessment & Plan:   Problem List Items Addressed This Visit       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  MRI of the lumbar spine is still pending.  Symptoms are largely unchanged.  She is managing her pain with Flexeril and meloxicam.  Meloxicam has been refilled today.  She will follow-up with orthopedic surgery upon completion of MRI.      Closed nondisplaced fracture of lateral malleolus of right fibula    She suffered a fall on 6/10, resulting in fracture of the right lateral malleolus.  Currently wearing a cam walking boot. -Meloxicam refilled today -I have recommended that she contact orthopedic surgery to arrange follow-up      Seizures [facility])    She endorses a history of seizures.  Not currently taking any antiepileptic  medications.  Previously referred to neurology for evaluation of seizures, migraines, and a history of TIA but has not establish care.  She is now interested in establishing care with neurology.  Last referred in November 2023. -We will follow-up on the status of previously placed neurology referral      Prediabetes - Primary    A1c 6.4 on labs from November.  She is not currently taking any medication for this. -Repeat A1c ordered today.  Start appropriate medication pending results.      Vitamin D insufficiency    Noted on previous labs.  Not currently on vitamin D supplementation.  Repeat vitamin D level ordered today.      Obesity (BMI 30-39.9)    BMI 37.  She is potentially interested in starting medication for weight loss. -Repeat labs ordered today -Lifestyle modifications aimed at weight loss were reviewed -Consider appropriate medication based on lab results from today.      Return in about 3 months (around 04/03/2023).   Billie Lade, MD

## 2023-01-02 LAB — HEMOGLOBIN A1C
Est. average glucose Bld gHb Est-mCnc: 140 mg/dL
Hgb A1c MFr Bld: 6.5 % — ABNORMAL HIGH (ref 4.8–5.6)

## 2023-01-02 LAB — VITAMIN D 25 HYDROXY (VIT D DEFICIENCY, FRACTURES): Vit D, 25-Hydroxy: 28.1 ng/mL — ABNORMAL LOW (ref 30.0–100.0)

## 2023-01-03 ENCOUNTER — Other Ambulatory Visit: Payer: Self-pay | Admitting: Internal Medicine

## 2023-01-03 DIAGNOSIS — G43709 Chronic migraine without aura, not intractable, without status migrainosus: Secondary | ICD-10-CM

## 2023-01-03 DIAGNOSIS — G459 Transient cerebral ischemic attack, unspecified: Secondary | ICD-10-CM

## 2023-01-03 DIAGNOSIS — R569 Unspecified convulsions: Secondary | ICD-10-CM

## 2023-01-03 DIAGNOSIS — R202 Paresthesia of skin: Secondary | ICD-10-CM

## 2023-01-03 LAB — TB SKIN TEST
Induration: 0.1 mm
TB Skin Test: NEGATIVE

## 2023-01-06 ENCOUNTER — Encounter: Payer: Self-pay | Admitting: Internal Medicine

## 2023-01-06 ENCOUNTER — Telehealth: Payer: Self-pay | Admitting: Internal Medicine

## 2023-01-06 NOTE — Telephone Encounter (Signed)
Patient called to follow up on recent appointment. Stated the Fit For Duty form doesn't specify she needs a boot which was discussed during visit last week.  Form needs to specify time frame she'll need the boot, and the length of time she'll need to use the chair which should be 1 month (long enough for her to get to the appointment with the specialist).   Patient unable to start new job until this information is received; stated form was sent via MyChart already.  Please advise patient at [phone].

## 2023-01-06 NOTE — Telephone Encounter (Signed)
Sent patient message back on mychart. Also added needed dates to form when patient brought them by.

## 2023-01-23 ENCOUNTER — Other Ambulatory Visit: Payer: Self-pay | Admitting: Internal Medicine

## 2023-01-23 DIAGNOSIS — R1012 Left upper quadrant pain: Secondary | ICD-10-CM

## 2023-01-26 DIAGNOSIS — R300 Dysuria: Secondary | ICD-10-CM | POA: Diagnosis not present

## 2023-01-26 DIAGNOSIS — N3001 Acute cystitis with hematuria: Secondary | ICD-10-CM | POA: Diagnosis not present

## 2023-01-26 DIAGNOSIS — I10 Essential (primary) hypertension: Secondary | ICD-10-CM | POA: Diagnosis not present

## 2023-01-26 DIAGNOSIS — R3589 Other polyuria: Secondary | ICD-10-CM | POA: Diagnosis not present

## 2023-01-26 DIAGNOSIS — Z760 Encounter for issue of repeat prescription: Secondary | ICD-10-CM | POA: Diagnosis not present

## 2023-01-29 ENCOUNTER — Ambulatory Visit (INDEPENDENT_AMBULATORY_CARE_PROVIDER_SITE_OTHER): Payer: 59 | Admitting: Internal Medicine

## 2023-01-29 ENCOUNTER — Encounter: Payer: Self-pay | Admitting: Orthopedic Surgery

## 2023-01-29 ENCOUNTER — Ambulatory Visit (INDEPENDENT_AMBULATORY_CARE_PROVIDER_SITE_OTHER): Payer: 59 | Admitting: Orthopedic Surgery

## 2023-01-29 ENCOUNTER — Encounter: Payer: Self-pay | Admitting: Internal Medicine

## 2023-01-29 VITALS — BP 136/87 | HR 118 | Ht 66.0 in | Wt 227.0 lb

## 2023-01-29 VITALS — BP 154/112 | HR 108 | Ht 66.0 in | Wt 225.0 lb

## 2023-01-29 DIAGNOSIS — S99911A Unspecified injury of right ankle, initial encounter: Secondary | ICD-10-CM | POA: Diagnosis not present

## 2023-01-29 DIAGNOSIS — I10 Essential (primary) hypertension: Secondary | ICD-10-CM | POA: Diagnosis not present

## 2023-01-29 DIAGNOSIS — E119 Type 2 diabetes mellitus without complications: Secondary | ICD-10-CM

## 2023-01-29 DIAGNOSIS — E1169 Type 2 diabetes mellitus with other specified complication: Secondary | ICD-10-CM | POA: Diagnosis not present

## 2023-01-29 DIAGNOSIS — E785 Hyperlipidemia, unspecified: Secondary | ICD-10-CM

## 2023-01-29 MED ORDER — TIRZEPATIDE 2.5 MG/0.5ML ~~LOC~~ SOAJ
2.5000 mg | SUBCUTANEOUS | 0 refills | Status: DC
Start: 2023-01-29 — End: 2023-02-07

## 2023-01-29 MED ORDER — ATORVASTATIN CALCIUM 20 MG PO TABS
20.0000 mg | ORAL_TABLET | Freq: Every day | ORAL | 3 refills | Status: DC
Start: 2023-01-29 — End: 2023-12-04

## 2023-01-29 MED ORDER — LISINOPRIL 5 MG PO TABS
5.0000 mg | ORAL_TABLET | Freq: Every day | ORAL | 3 refills | Status: DC
Start: 2023-01-29 — End: 2023-12-04

## 2023-01-29 NOTE — Assessment & Plan Note (Signed)
New diagnosis.  Meeting criteria with A1c 6.5 on labs from last month. -Through shared decision making, Mounjaro 2.5 mg weekly has been started today.  She is interested in losing weight, and Mounjaro should help in this regard in addition to improving glycemic control. -Atorvastatin 20 mg daily prescribed today for treatment of hyperlipidemia in the setting of T2DM -Lisinopril 5 mg daily has been added for renal protection -Urine microalbumin/creatinine ratio ordered today -Diabetic foot exam deferred until next appointment -She will follow-up with her optometrist for a diabetic eye exam

## 2023-01-29 NOTE — Patient Instructions (Addendum)
Give work note ok to d/c the boot can work full duty out of [employer]

## 2023-01-29 NOTE — Assessment & Plan Note (Addendum)
Lipid panel updated in November.  LDL 123.  Her 10-year ASCVD risk today is 6.6%.  Atorvastatin 20 mg daily has been started.

## 2023-01-29 NOTE — Patient Instructions (Signed)
It was a pleasure to see you today.  Thank you for giving [place] the opportunity to be involved in your care.  Below is a brief recap of your visit and next steps.  We will plan to see you again in September.  Summary Start Mounjaro 2.5 mg weekly Start atorvastatin and lisinopril Urine study ordered today Follow up in September as scheduled

## 2023-01-29 NOTE — Assessment & Plan Note (Signed)
Previously documented history of essential hypertension.  This is controlled with metoprolol tartrate.  Lisinopril 5 mg daily has been added for renal protection in the setting of diabetes mellitus.

## 2023-01-29 NOTE — Progress Notes (Signed)
Chief Complaint  Patient presents with   Ankle Injury    Right injury June 5th or 6th went to ER, states ready to d/c the voot    52 year old female twisted her right foot and ankle walking her dog.  She was seen in the emergency room x-rays showed a questionable fracture of the fibula she was told to wear cam walker for 4 weeks.  She did not have insurance at the time and so did not see a doctor for evaluation.  She has recovered nicely she only wears the boot at work because she is mandated by the employer she takes the boot off as soon as she gets in her car and walks without it.  She has regained normal function  Physical Exam Vitals and nursing note reviewed.  Constitutional:      Appearance: Normal appearance.  HENT:     Head: Normocephalic and atraumatic.  Eyes:     General: No scleral icterus.       Right eye: No discharge.        Left eye: No discharge.     Extraocular Movements: Extraocular movements intact.     Conjunctiva/sclera: Conjunctivae normal.     Pupils: Pupils are equal, round, and reactive to light.  Cardiovascular:     Rate and Rhythm: Normal rate.     Pulses: Normal pulses.  Skin:    General: Skin is warm and dry.     Capillary Refill: Capillary refill takes less than 2 seconds.  Neurological:     General: No focal deficit present.     Mental Status: She is alert and oriented to person, place, and time.  Psychiatric:        Mood and Affect: Mood normal.        Behavior: Behavior normal.        Thought Content: Thought content normal.        Judgment: Judgment normal.    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  Is okay to wear ankle sleeve as  needed  Follow-up as needed

## 2023-01-29 NOTE — Progress Notes (Signed)
Established Patient Office Visit  Subjective   Patient ID: Erin Murray, female    DOB: 1971/05/17  Age: 52 y.o. MRN: [id number]  Chief Complaint  Patient presents with   Diabetes    Follow up     Erin Murray returns to care today for follow-up.  She was last evaluated by me on 6/19.  Repeat labs were obtained at that time and showed HgbA1c of 6.5, meeting criteria for diabetes mellitus.  This is a new diagnosis.  4-week follow-up was arranged for diabetes management.  In the interim she was seen by orthopedic surgery earlier today.  There have otherwise been no acute interval events. Erin Murray reports feeling fairly well today.  She is asymptomatic and has no additional concerns to discuss.  Past Medical History:  Diagnosis Date   Complicated migraine    includes: visual changes, left sided weakness, confusion, weakness, dizziness   Dysrhythmia    Fibromyalgia    Fibromyalgia    GERD (gastroesophageal reflux disease)    Headache(784.0)    Hypertension    Neurological disorder    unnamed neurological disorder   Reflux    Seizures (HCC)    Tachycardia    TIA (transient ischemic attack) 07/15/2008   Past Surgical History:  Procedure Laterality Date   BIOPSY  08/19/2022   Procedure: BIOPSY;  Surgeon: Lanelle Bal, DO;  Location: [facility];  Service: Endoscopy;;   CHOLECYSTECTOMY     COLONOSCOPY     2015 per patient. done in [place], [place].   COLONOSCOPY WITH PROPOFOL N/A 08/19/2022   Procedure: COLONOSCOPY WITH PROPOFOL;  Surgeon: Lanelle Bal, DO;  Location: [facility];  Service: Endoscopy;  Laterality: N/A;  10:15am, asa 3   ESOPHAGOGASTRODUODENOSCOPY N/A 10/14/2014   Procedure: ESOPHAGOGASTRODUODENOSCOPY (EGD);  Surgeon: Malissa Hippo, MD;  Location: [facility];  Service: Endoscopy;  Laterality: N/A;  210 - moved to 12:45 - Ann to notify pt   ESOPHAGOGASTRODUODENOSCOPY (EGD) WITH PROPOFOL N/A 08/19/2022   Procedure: ESOPHAGOGASTRODUODENOSCOPY (EGD) WITH  PROPOFOL;  Surgeon: Lanelle Bal, DO;  Location: [facility];  Service: Endoscopy;  Laterality: N/A;   OVARIAN CYST DRAINAGE     POLYPECTOMY  08/19/2022   Procedure: POLYPECTOMY;  Surgeon: Lanelle Bal, DO;  Location: [facility];  Service: Endoscopy;;   TUBAL LIGATION     Social History   Tobacco Use   Smoking status: Never    Passive exposure: Never   Smokeless tobacco: Never  Vaping Use   Vaping status: Never Used  Substance Use Topics   Alcohol use: No   Drug use: No   Family History  Problem Relation Age of Onset   High Cholesterol Mother    Asthma Mother    High Cholesterol Father    Heart disease Father    Colon cancer Father 102   Colon cancer Sister    Breast cancer Sister    COPD Maternal Grandfather    Allergies  Allergen Reactions   Sulfa Antibiotics Nausea And Vomiting and Other (See Comments)    REACTION: Family members "almost died" taking this medication   Penicillins Hives   Iodinated Contrast Media Other (See Comments)      Review of Systems  Constitutional:  Negative for chills and fever.  HENT:  Negative for sore throat.   Respiratory:  Negative for cough and shortness of breath.   Cardiovascular:  Negative for chest pain, palpitations and leg swelling.  Gastrointestinal:  Negative for  abdominal pain, blood in stool, constipation, diarrhea, nausea and vomiting.  Genitourinary:  Negative for dysuria and hematuria.  Musculoskeletal:  Negative for myalgias.  Skin:  Negative for itching and rash.  Neurological:  Negative for dizziness and headaches.  Psychiatric/Behavioral:  Negative for depression and suicidal ideas.       Objective:     BP 136/87   Pulse (!) 118   Ht 5\' 6"  (1.676 m)   Wt 227 lb (103 kg)   LMP 08/24/2019   SpO2 94%   BMI 36.64 kg/m  BP Readings from Last 3 Encounters:  01/29/23 136/87  01/29/23 (!) 154/112  01/01/23 131/84      Physical Exam Vitals reviewed.  Constitutional:      General: She is not  in acute distress.    Appearance: Normal appearance. She is obese. She is not toxic-appearing.  HENT:     Head: Normocephalic and atraumatic.     Right Ear: External ear normal.     Left Ear: External ear normal.     Nose: Nose normal. No congestion or rhinorrhea.     Mouth/Throat:     Mouth: Mucous membranes are moist.     Pharynx: Oropharynx is clear. No oropharyngeal exudate or posterior oropharyngeal erythema.  Eyes:     General: No scleral icterus.    Extraocular Movements: Extraocular movements intact.     Conjunctiva/sclera: Conjunctivae normal.     Pupils: Pupils are equal, round, and reactive to light.  Cardiovascular:     Rate and Rhythm: Normal rate and regular rhythm.     Pulses: Normal pulses.     Heart sounds: Normal heart sounds. No murmur heard.    No friction rub. No gallop.  Pulmonary:     Effort: Pulmonary effort is normal.     Breath sounds: Normal breath sounds. No wheezing, rhonchi or rales.  Abdominal:     General: Abdomen is flat. Bowel sounds are normal. There is no distension.     Palpations: Abdomen is soft.     Tenderness: There is no abdominal tenderness.  Musculoskeletal:        General: No swelling. Normal range of motion.     Cervical back: Normal range of motion.     Right lower leg: No edema.     Left lower leg: No edema.  Lymphadenopathy:     Cervical: No cervical adenopathy.  Skin:    General: Skin is warm and dry.     Capillary Refill: Capillary refill takes less than 2 seconds.     Coloration: Skin is not jaundiced.  Neurological:     General: No focal deficit present.     Mental Status: She is alert and oriented to person, place, and time.  Psychiatric:        Mood and Affect: Mood normal.        Behavior: Behavior normal.   Last CBC Lab Results  Component Value Date   WBC 9.0 06/04/2022   HGB 13.0 06/04/2022   HCT 40.5 06/04/2022   MCV 79 06/04/2022   MCH 25.4 (L) 06/04/2022   RDW 14.1 06/04/2022   PLT 381 06/04/2022    Last metabolic panel Lab Results  Component Value Date   GLUCOSE 94 06/04/2022   NA 139 06/04/2022   K 4.2 06/04/2022   CL 107 (H) 06/04/2022   CO2 18 (L) 06/04/2022   BUN 14 06/04/2022   CREATININE 0.74 06/04/2022   EGFR 98 06/04/2022   CALCIUM 9.5 06/04/2022  PROT 7.4 06/04/2022   ALBUMIN 4.4 06/04/2022   LABGLOB 3.0 06/04/2022   AGRATIO 1.5 06/04/2022   BILITOT <0.2 06/04/2022   ALKPHOS 150 (H) 06/04/2022   AST 16 06/04/2022   ALT 16 06/04/2022   ANIONGAP 6 05/12/2022   Last lipids Lab Results  Component Value Date   CHOL 186 06/04/2022   HDL 35 (L) 06/04/2022   LDLCALC 123 (H) 06/04/2022   TRIG 157 (H) 06/04/2022   CHOLHDL 5.3 (H) 06/04/2022   Last hemoglobin A1c Lab Results  Component Value Date   HGBA1C 6.5 (H) 01/01/2023   Last thyroid functions Lab Results  Component Value Date   TSH 3.860 06/04/2022   Last vitamin D Lab Results  Component Value Date   VD25OH 28.1 (L) 01/01/2023   Last vitamin B12 and Folate Lab Results  Component Value Date   VITAMINB12 335 06/04/2022   FOLATE 9.5 06/04/2022   The 10-year ASCVD risk score (Arnett DK, et al., 2019) is: 6.6%    Assessment & Plan:   Problem List Items Addressed This Visit       Hypertension    Previously documented history of essential hypertension.  This is controlled with metoprolol tartrate.  Lisinopril 5 mg daily has been added for renal protection in the setting of diabetes mellitus.      Type 2 diabetes mellitus without complications (HCC) - Primary    New diagnosis.  Meeting criteria with A1c 6.5 on labs from last month. -Through shared decision making, Mounjaro 2.5 mg weekly has been started today.  She is interested in losing weight, and Mounjaro should help in this regard in addition to improving glycemic control. -Atorvastatin 20 mg daily prescribed today for treatment of hyperlipidemia in the setting of T2DM -Lisinopril 5 mg daily has been added for renal protection -Urine  microalbumin/creatinine ratio ordered today -Diabetic foot exam deferred until next appointment -She will follow-up with her optometrist for a diabetic eye exam      Hyperlipidemia associated with type 2 diabetes mellitus (HCC)    Lipid panel updated in November.  LDL 123.  Her 10-year ASCVD risk today is 6.6%.  Atorvastatin 20 mg daily has been started.      Return in about 2 months (around 04/08/2023).   Billie Lade, MD

## 2023-01-31 ENCOUNTER — Encounter: Payer: Self-pay | Admitting: Internal Medicine

## 2023-02-01 LAB — MICROALBUMIN / CREATININE URINE RATIO
Creatinine, Urine: 110 mg/dL
Microalb/Creat Ratio: 12 mg/g creat (ref 0–29)
Microalbumin, Urine: 12.9 ug/mL

## 2023-02-03 MED ORDER — ONDANSETRON HCL 4 MG PO TABS: 4.0000 mg | ORAL_TABLET | Freq: Three times a day (TID) | ORAL | 0 refills | Status: DC | PRN

## 2023-02-06 ENCOUNTER — Ambulatory Visit
Admission: RE | Admit: 2023-02-06 | Discharge: 2023-02-06 | Disposition: A | Discharge status: Home / Self Care | Payer: 59 | Source: Ambulatory Visit | Attending: Internal Medicine | Admitting: Internal Medicine

## 2023-02-06 DIAGNOSIS — M545 Low back pain, unspecified: Secondary | ICD-10-CM | POA: Diagnosis not present

## 2023-02-06 DIAGNOSIS — M47816 Spondylosis without myelopathy or radiculopathy, lumbar region: Secondary | ICD-10-CM

## 2023-02-06 DIAGNOSIS — M48061 Spinal stenosis, lumbar region without neurogenic claudication: Secondary | ICD-10-CM

## 2023-02-07 ENCOUNTER — Other Ambulatory Visit: Payer: Self-pay

## 2023-02-07 DIAGNOSIS — E119 Type 2 diabetes mellitus without complications: Secondary | ICD-10-CM

## 2023-02-07 MED ORDER — TRULICITY 0.75 MG/0.5ML ~~LOC~~ SOAJ
0.7500 mg | SUBCUTANEOUS | 0 refills | Status: DC
Start: 2023-02-07 — End: 2023-05-19

## 2023-02-23 DIAGNOSIS — R030 Elevated blood-pressure reading, without diagnosis of hypertension: Secondary | ICD-10-CM | POA: Diagnosis not present

## 2023-02-23 DIAGNOSIS — Z6835 Body mass index (BMI) 35.0-35.9, adult: Secondary | ICD-10-CM | POA: Diagnosis not present

## 2023-02-23 DIAGNOSIS — E669 Obesity, unspecified: Secondary | ICD-10-CM | POA: Diagnosis not present

## 2023-02-23 DIAGNOSIS — M25462 Effusion, left knee: Secondary | ICD-10-CM | POA: Diagnosis not present

## 2023-03-06 ENCOUNTER — Emergency Department (HOSPITAL_COMMUNITY): Payer: 59

## 2023-03-06 ENCOUNTER — Emergency Department (HOSPITAL_COMMUNITY)
Admission: EM | Admit: 2023-03-06 | Discharge: 2023-03-06 | Discharge status: Against Medical Advice | Payer: 59 | Attending: Emergency Medicine | Admitting: Emergency Medicine

## 2023-03-06 ENCOUNTER — Other Ambulatory Visit: Payer: Self-pay

## 2023-03-06 ENCOUNTER — Encounter (HOSPITAL_COMMUNITY): Payer: Self-pay

## 2023-03-06 DIAGNOSIS — R0789 Other chest pain: Secondary | ICD-10-CM | POA: Diagnosis not present

## 2023-03-06 DIAGNOSIS — Z5321 Procedure and treatment not carried out due to patient leaving prior to being seen by health care provider: Secondary | ICD-10-CM | POA: Diagnosis not present

## 2023-03-06 DIAGNOSIS — R079 Chest pain, unspecified: Secondary | ICD-10-CM | POA: Diagnosis not present

## 2023-03-06 LAB — BASIC METABOLIC PANEL
Anion gap: 9 (ref 5–15)
BUN: 19 mg/dL (ref 6–20)
CO2: 25 mmol/L (ref 22–32)
Calcium: 9 mg/dL (ref 8.9–10.3)
Chloride: 103 mmol/L (ref 98–111)
Creatinine, Ser: 0.78 mg/dL (ref 0.44–1.00)
GFR, Estimated: >60 mL/min (ref >60)
Glucose, Bld: 97 mg/dL (ref 70–99)
Potassium: 3.4 mmol/L — ABNORMAL LOW (ref 3.5–5.1)
Sodium: 137 mmol/L (ref 135–145)

## 2023-03-06 LAB — CBC
HCT: 44.8 % (ref 36.0–46.0)
Hemoglobin: 14.2 g/dL (ref 12.0–15.0)
MCH: 26.2 pg (ref 26.0–34.0)
MCHC: 31.7 g/dL (ref 30.0–36.0)
MCV: 82.5 fL (ref 80.0–100.0)
Platelets: 284 10*3/uL (ref 150–400)
RBC: 5.43 MIL/uL — ABNORMAL HIGH (ref 3.87–5.11)
RDW: 13.8 % (ref 11.5–15.5)
WBC: 6.9 10*3/uL (ref 4.0–10.5)
nRBC: 0 % (ref 0.0–0.2)

## 2023-03-06 LAB — TROPONIN I (HIGH SENSITIVITY): Troponin I (High Sensitivity): <2 ng/L (ref <18)

## 2023-03-06 NOTE — ED Triage Notes (Signed)
Pt reports left side chest pain x 1 hour.  Pt denies any N/V or shob.

## 2023-03-11 NOTE — Telephone Encounter (Signed)
We filled out the paperwork around 01/01/2023. I will also send you a copy of the TB results as well.

## 2023-03-20 NOTE — Telephone Encounter (Signed)
Pt calling to follow up on staff medical form. Please advise Thank you

## 2023-03-24 ENCOUNTER — Other Ambulatory Visit: Payer: Self-pay | Admitting: Internal Medicine

## 2023-03-24 DIAGNOSIS — R000 Tachycardia, unspecified: Secondary | ICD-10-CM

## 2023-04-01 ENCOUNTER — Encounter: Payer: Self-pay | Admitting: Internal Medicine

## 2023-04-01 ENCOUNTER — Ambulatory Visit: Payer: Self-pay

## 2023-04-01 ENCOUNTER — Telehealth (INDEPENDENT_AMBULATORY_CARE_PROVIDER_SITE_OTHER): Payer: 59 | Admitting: Internal Medicine

## 2023-04-01 VITALS — Ht 66.0 in | Wt 205.0 lb

## 2023-04-01 DIAGNOSIS — R197 Diarrhea, unspecified: Secondary | ICD-10-CM

## 2023-04-01 NOTE — Assessment & Plan Note (Signed)
Evaluated today through virtual encounter endorsing intermittent diarrhea as described above.  Etiology of her symptoms is unclear.  Potentially related to medications such as Mounjaro.  She reports that she has been taking her husband's Mounjaro as she awaits prior authorization.  She has not been able to identify any foods triggering her symptoms.  Further workup is indicated given the duration of symptoms. -CBC, CMP, and inflammatory markers ordered today -I have also ordered stool studies (GI PCR panel, stool culture, and H. pylori stool antigen) -Recommend adequate hydration and supportive care measures.  Brat diet reviewed. -Further recommendations pending lab results. -She has previously scheduled follow-up on 10/3

## 2023-04-01 NOTE — Progress Notes (Signed)
Virtual Visit via Video Note  I connected with Erin Murray on 04/01/23 at  1:40 PM EDT by a video enabled telemedicine application and verified that I am speaking with the correct person using two identifiers.  Patient Location: Home Provider Location: Office/Clinic  I discussed the limitations, risks, security, and privacy concerns of performing an evaluation and management service by video and the availability of in person appointments. I also discussed with the patient that there may be a patient responsible charge related to this service. The patient expressed understanding and agreed to proceed.  Subjective: PCP: Erin Lade, MD  Chief Complaint  Patient presents with   Diarrhea    Consistent Diarrhea for over a month. Comes and goes every 2-3 days. Has been steady since 5 am this morning.    Erin Murray has been evaluated today for an acute visit through virtual encounter reporting a 1 month history of intermittent diarrhea.  She states that she experiences watery diarrhea every 2-3 days.  Today diarrhea has occurred more frequently and kept her home from work.  She largely denies additional symptoms, including fever/chills and significant abdominal pain.  She has not appreciated any blood in her stool.  Erin Murray has not been able to identify any foods that trigger diarrhea.  There have been no recent medication changes and she has not been prescribed antibiotics recently.  No family members in her home are experiencing diarrhea.  She has tried taking Imodium for symptom relief but states that it has not been helpful.  She has also tried using Pepto-Bismol.  ROS: Per HPI  Current Outpatient Medications:    aspirin EC 81 MG tablet, Take 81 mg by mouth daily., Disp: , Rfl:    atorvastatin (LIPITOR) 20 MG tablet, Take 1 tablet (20 mg total) by mouth daily., Disp: 90 tablet, Rfl: 3   cyclobenzaprine (FLEXERIL) 10 MG tablet, Take 1 tablet (10 mg total) by mouth 3 (three) times  daily as needed for muscle spasms. Do not drink alcohol or drive while taking this medication.  May cause drowsiness., Disp: 15 tablet, Rfl: 0   Dulaglutide (TRULICITY) 0.75 MG/0.5ML SOPN, Inject 0.75 mg into the skin once a week., Disp: 2 mL, Rfl: 0   fluticasone (FLONASE) 50 MCG/ACT nasal spray, Place 1 spray into both nostrils 2 (two) times daily., Disp: 16 g, Rfl: 2   HYDROcodone-acetaminophen (NORCO/VICODIN) 5-325 MG tablet, Take 1 tablet by mouth every 4 (four) hours as needed., Disp: 6 tablet, Rfl: 0   lisinopril (ZESTRIL) 5 MG tablet, Take 1 tablet (5 mg total) by mouth daily., Disp: 90 tablet, Rfl: 3   melatonin 5 MG TABS, Take 20 mg by mouth at bedtime as needed., Disp: , Rfl:    metoprolol tartrate (LOPRESSOR) 50 MG tablet, Take 1 tablet by mouth twice daily, Disp: 60 tablet, Rfl: 0   nitrofurantoin, macrocrystal-monohydrate, (MACROBID) 100 MG capsule, Take 100 mg by mouth 2 (two) times daily., Disp: , Rfl:    nitroGLYCERIN (NITROSTAT) 0.4 MG SL tablet, Place 1 tablet (0.4 mg total) under the tongue every 5 (five) minutes as needed for chest pain., Disp: 25 tablet, Rfl: 3   ondansetron (ZOFRAN) 4 MG tablet, Take 1 tablet (4 mg total) by mouth every 8 (eight) hours as needed for nausea or vomiting., Disp: 20 tablet, Rfl: 0   pantoprazole (PROTONIX) 40 MG tablet, Take 1 tablet by mouth twice daily, Disp: 60 tablet, Rfl: 0   rizatriptan (MAXALT-MLT) 10 MG disintegrating tablet, Take  1 tablet (10 mg total) by mouth as needed for migraine. May repeat in 2 hours if needed, Disp: 15 tablet, Rfl: 6   topiramate (TOPAMAX) 50 MG tablet, Take 2 tablets (100 mg total) by mouth 2 (two) times daily. One po bid xone week, then 2 tabs po bid, Disp: 120 tablet, Rfl: 12   vitamin B-12 (CYANOCOBALAMIN) 1000 MCG tablet, Take 5,000 mcg by mouth daily., Disp: , Rfl:   Observations/Objective: Today's Vitals   04/01/23 1338  Weight: 205 lb (93 kg)  Height: 5\' 6"  (1.676 m)  PainSc: 0-No pain   Assessment and  Plan:  Intermittent diarrhea Assessment & Plan: Evaluated today through virtual encounter endorsing intermittent diarrhea as described above.  Etiology of her symptoms is unclear.  Potentially related to medications such as Mounjaro.  She reports that she has been taking her husband's Mounjaro as she awaits prior authorization.  She has not been able to identify any foods triggering her symptoms.  Further workup is indicated given the duration of symptoms. -CBC, CMP, and inflammatory markers ordered today -I have also ordered stool studies (GI PCR panel, stool culture, and H. pylori stool antigen) -Recommend adequate hydration and supportive care measures.  Brat diet reviewed. -Further recommendations pending lab results. -She has previously scheduled follow-up on 10/3  Follow Up Instructions: Return if symptoms worsen or fail to improve.   I discussed the assessment and treatment plan with the patient. The patient was provided an opportunity to ask questions, and all were answered. The patient agreed with the plan and demonstrated an understanding of the instructions.   The patient was advised to call back or seek an in-person evaluation if the symptoms worsen or if the condition fails to improve as anticipated.  The above assessment and management plan was discussed with the patient. The patient verbalized understanding of and has agreed to the management plan.   Erin Lade, MD

## 2023-04-04 ENCOUNTER — Encounter (HOSPITAL_COMMUNITY): Payer: Self-pay | Admitting: Emergency Medicine

## 2023-04-04 ENCOUNTER — Emergency Department (HOSPITAL_COMMUNITY)
Admission: EM | Admit: 2023-04-04 | Discharge: 2023-04-04 | Disposition: A | Discharge status: Home / Self Care | Payer: 59 | Attending: Emergency Medicine | Admitting: Emergency Medicine

## 2023-04-04 ENCOUNTER — Other Ambulatory Visit: Payer: Self-pay

## 2023-04-04 DIAGNOSIS — Z7982 Long term (current) use of aspirin: Secondary | ICD-10-CM | POA: Diagnosis not present

## 2023-04-04 DIAGNOSIS — Z1152 Encounter for screening for COVID-19: Secondary | ICD-10-CM | POA: Diagnosis not present

## 2023-04-04 DIAGNOSIS — I10 Essential (primary) hypertension: Secondary | ICD-10-CM | POA: Insufficient documentation

## 2023-04-04 DIAGNOSIS — Z7984 Long term (current) use of oral hypoglycemic drugs: Secondary | ICD-10-CM | POA: Insufficient documentation

## 2023-04-04 DIAGNOSIS — R7309 Other abnormal glucose: Secondary | ICD-10-CM

## 2023-04-04 DIAGNOSIS — E876 Hypokalemia: Secondary | ICD-10-CM | POA: Insufficient documentation

## 2023-04-04 DIAGNOSIS — Z79899 Other long term (current) drug therapy: Secondary | ICD-10-CM | POA: Insufficient documentation

## 2023-04-04 DIAGNOSIS — E1165 Type 2 diabetes mellitus with hyperglycemia: Secondary | ICD-10-CM | POA: Diagnosis not present

## 2023-04-04 DIAGNOSIS — Z8673 Personal history of transient ischemic attack (TIA), and cerebral infarction without residual deficits: Secondary | ICD-10-CM | POA: Insufficient documentation

## 2023-04-04 DIAGNOSIS — R197 Diarrhea, unspecified: Secondary | ICD-10-CM | POA: Insufficient documentation

## 2023-04-04 LAB — CBC WITH DIFFERENTIAL/PLATELET
Abs Immature Granulocytes: 0.02 10*3/uL (ref 0.00–0.07)
Basophils Absolute: 0 10*3/uL (ref 0.0–0.1)
Basophils Relative: 0 %
Eosinophils Absolute: 0.3 10*3/uL (ref 0.0–0.5)
Eosinophils Relative: 4 %
HCT: 40.4 % (ref 36.0–46.0)
Hemoglobin: 12.7 g/dL (ref 12.0–15.0)
Immature Granulocytes: 0 %
Lymphocytes Relative: 32 %
Lymphs Abs: 2.3 10*3/uL (ref 0.7–4.0)
MCH: 27 pg (ref 26.0–34.0)
MCHC: 31.4 g/dL (ref 30.0–36.0)
MCV: 86 fL (ref 80.0–100.0)
Monocytes Absolute: 0.5 10*3/uL (ref 0.1–1.0)
Monocytes Relative: 6 %
Neutro Abs: 4.1 10*3/uL (ref 1.7–7.7)
Neutrophils Relative %: 58 %
Platelets: 242 10*3/uL (ref 150–400)
RBC: 4.7 MIL/uL (ref 3.87–5.11)
RDW: 14 % (ref 11.5–15.5)
WBC: 7.2 10*3/uL (ref 4.0–10.5)
nRBC: 0 % (ref 0.0–0.2)

## 2023-04-04 LAB — SEDIMENTATION RATE: Sed Rate: 12 mm/hr (ref 0–22)

## 2023-04-04 LAB — C DIFFICILE QUICK SCREEN W PCR REFLEX
C Diff antigen: NEGATIVE
C Diff interpretation: NOT DETECTED
C Diff toxin: NEGATIVE

## 2023-04-04 LAB — COMPREHENSIVE METABOLIC PANEL
ALT: 23 U/L (ref 0–44)
AST: 16 U/L (ref 15–41)
Albumin: 3.8 g/dL (ref 3.5–5.0)
Alkaline Phosphatase: 103 U/L (ref 38–126)
Anion gap: 11 (ref 5–15)
BUN: 15 mg/dL (ref 6–20)
CO2: 28 mmol/L (ref 22–32)
Calcium: 9 mg/dL (ref 8.9–10.3)
Chloride: 102 mmol/L (ref 98–111)
Creatinine, Ser: 0.69 mg/dL (ref 0.44–1.00)
GFR, Estimated: >60 mL/min (ref >60)
Glucose, Bld: 102 mg/dL — ABNORMAL HIGH (ref 70–99)
Potassium: 3.4 mmol/L — ABNORMAL LOW (ref 3.5–5.1)
Sodium: 141 mmol/L (ref 135–145)
Total Bilirubin: 0.5 mg/dL (ref 0.3–1.2)
Total Protein: 7.1 g/dL (ref 6.5–8.1)

## 2023-04-04 LAB — SARS CORONAVIRUS 2 BY RT PCR: SARS Coronavirus 2 by RT PCR: NEGATIVE

## 2023-04-04 LAB — C-REACTIVE PROTEIN: CRP: 1.4 mg/dL — ABNORMAL HIGH (ref <1.0)

## 2023-04-04 LAB — LIPASE, BLOOD: Lipase: 27 U/L (ref 11–51)

## 2023-04-04 MED ORDER — KETOROLAC TROMETHAMINE 30 MG/ML IJ SOLN
30.0000 mg | Freq: Once | INTRAMUSCULAR | Status: AC
  Administered 2023-04-04: 30 mg via INTRAVENOUS
  Filled 2023-04-04: qty 1

## 2023-04-04 MED ORDER — SODIUM CHLORIDE 0.9 % IV BOLUS
1000.0000 mL | Freq: Once | INTRAVENOUS | Status: AC
  Administered 2023-04-04: 1000 mL via INTRAVENOUS

## 2023-04-04 NOTE — ED Provider Notes (Signed)
[facility] Provider Note   CSN: [id number] Arrival date & time: 04/04/23  0046     History  Chief Complaint  Patient presents with   Diarrhea    Erin Murray is a 52 y.o. female.  The history is provided by the patient.  Diarrhea She has a history of hypertension, diabetes, hyperlipidemia, GERD, seizures, transient ischemic attack and comes in complaining of diarrhea.  She has been having diarrhea intermittently over the last month.  3 days ago, she had severe diarrhea as often as 3 times in an hour.  She did have a video visit with her primary care provider who wanted her to have lab work and stool testing done, but she was not able to go into the office to have the lab work ordered.  She was doing better 2 days ago, but then had recurrence of diarrhea last night.  There is some crampy lower abdominal pain.  She denies any nausea or vomiting.  She denies fever or chills.  There has not been any blood or mucus in the stool.  She has taken Pepto-Bismol, Mylanta, loperamide without any improvement.  She denies recent antibiotic use.  She denies any sick contacts.  Currently, she is also complaining of a generalized headache.   Home Medications Prior to Admission medications   Medication Sig Start Date End Date Taking? Authorizing Provider  aspirin EC 81 MG tablet Take 81 mg by mouth daily.    [provider]  atorvastatin (LIPITOR) 20 MG tablet Take 1 tablet (20 mg total) by mouth daily. 01/29/23   Billie Lade, MD  cyclobenzaprine (FLEXERIL) 10 MG tablet Take 1 tablet (10 mg total) by mouth 3 (three) times daily as needed for muscle spasms. Do not drink alcohol or drive while taking this medication.  May cause drowsiness. 06/20/22   Particia Nearing, PA-C  Dulaglutide (TRULICITY) 0.75 MG/0.5ML SOPN Inject 0.75 mg into the skin once a week. 02/07/23   Billie Lade, MD  fluticasone (FLONASE) 50 MCG/ACT nasal spray Place 1  spray into both nostrils 2 (two) times daily. 07/23/22   Particia Nearing, PA-C  HYDROcodone-acetaminophen (NORCO/VICODIN) 5-325 MG tablet Take 1 tablet by mouth every 4 (four) hours as needed. 12/23/22   Triplett, Tammy, PA-C  lisinopril (ZESTRIL) 5 MG tablet Take 1 tablet (5 mg total) by mouth daily. 01/29/23   Billie Lade, MD  melatonin 5 MG TABS Take 20 mg by mouth at bedtime as needed.    [provider]  metoprolol tartrate (LOPRESSOR) 50 MG tablet Take 1 tablet by mouth twice daily 03/24/23   Billie Lade, MD  nitrofurantoin, macrocrystal-monohydrate, (MACROBID) 100 MG capsule Take 100 mg by mouth 2 (two) times daily. 01/26/23   [provider]  nitroGLYCERIN (NITROSTAT) 0.4 MG SL tablet Place 1 tablet (0.4 mg total) under the tongue every 5 (five) minutes as needed for chest pain. 08/26/19 01/01/23  Laqueta Linden, MD  ondansetron (ZOFRAN) 4 MG tablet Take 1 tablet (4 mg total) by mouth every 8 (eight) hours as needed for nausea or vomiting. 02/03/23   Billie Lade, MD  pantoprazole (PROTONIX) 40 MG tablet Take 1 tablet by mouth twice daily 01/24/23   Billie Lade, MD  rizatriptan (MAXALT-MLT) 10 MG disintegrating tablet Take 1 tablet (10 mg total) by mouth as needed for migraine. May repeat in 2 hours if needed 06/04/22   Billie Lade, MD  topiramate (TOPAMAX)  50 MG tablet Take 2 tablets (100 mg total) by mouth 2 (two) times daily. One po bid xone week, then 2 tabs po bid 06/04/22   Billie Lade, MD  vitamin B-12 (CYANOCOBALAMIN) 1000 MCG tablet Take 5,000 mcg by mouth daily.    [provider]      Allergies    Sulfa antibiotics and Penicillins    Review of Systems   Review of Systems  Gastrointestinal:  Positive for diarrhea.  All other systems reviewed and are negative.   Physical Exam Updated Vital Signs BP 118/85 (BP Location: Right Arm)   Pulse 89   Temp 98.7 F (37.1 C) (Oral)   Resp 16   Ht 5\' 6"  (1.676 m)   Wt 93 kg    LMP 08/24/2019   SpO2 98%   BMI 33.09 kg/m  Physical Exam Vitals and nursing note reviewed.   52 year old female, resting comfortably and in no acute distress. Vital signs are normal. Oxygen saturation is 98%, which is normal. Head is normocephalic and atraumatic. PERRLA, EOMI. Oropharynx is clear. Neck is nontender and supple without adenopathy. Lungs are clear without rales, wheezes, or rhonchi. Chest is nontender. Heart has regular rate and rhythm without murmur. Abdomen is soft, flat, nontender. Extremities have no cyanosis or edema. Skin is warm and dry without rash. Neurologic: Mental status is normal, cranial nerves are intact, moves all extremities equally.  ED Results / Procedures / Treatments   Labs (all labs ordered are listed, but only abnormal results are displayed) Labs Reviewed  COMPREHENSIVE METABOLIC PANEL - Abnormal; Notable for the following components:      Result Value   Potassium 3.4 (*)    Glucose, Bld 102 (*)    All other components within normal limits  SARS CORONAVIRUS 2 BY RT PCR  C DIFFICILE QUICK SCREEN W PCR REFLEX    GASTROINTESTINAL PANEL BY PCR, STOOL (REPLACES STOOL CULTURE)  CBC WITH DIFFERENTIAL/PLATELET  LIPASE, BLOOD  SEDIMENTATION RATE  C-REACTIVE PROTEIN   Procedures Procedures    Medications Ordered in ED Medications - No data to display  ED Course/ Medical Decision Making/ A&P                                 Medical Decision Making Amount and/or Complexity of Data Reviewed Labs: ordered.  Risk Prescription drug management.   Diarrhea, etiology unclear.  Consider infectious causes such as viral gastroenteritis, C. difficile infection.  Doubt secretory diarrhea.  Doubt malabsorption.  I have reviewed her past records, and note video visit with primary care provider on 04/01/2023 with orders placed for CBC, comprehensive metabolic panel, CRP, sedimentation rate and stool samples including H. pylori, stool culture.  I have  ordered his lab tests, but I actually suspect that he meant C. difficile rather than H. pylori and I have ordered C. difficile stool sample.  I have ordered IV fluids and IV ketorolac.  I have reviewed her laboratory tests, and my interpretation is normal CBC including normal WBC differential, mild hypokalemia likely secondary to gastrointestinal losses, mild elevation of random glucose which will need to be followed as an outpatient.  Unfortunately, patient left the emergency department before I could speak with her.  I had wanted to prescribe cholestyramine to try to help control her diarrhea.  A stool sample was never obtained.  Final Clinical Impression(s) / ED Diagnoses Final diagnoses:  Diarrhea, unspecified type  Elevated  random blood glucose level  Hypokalemia    Rx / DC Orders ED Discharge Orders     None         Dione Booze, MD 04/04/23 [phone]

## 2023-04-04 NOTE — ED Triage Notes (Signed)
Pt reports diarrhea x 2 days with cold symptoms, denies fevers

## 2023-04-05 LAB — GASTROINTESTINAL PANEL BY PCR, STOOL (REPLACES STOOL CULTURE)

## 2023-04-08 ENCOUNTER — Telehealth: Payer: Self-pay | Admitting: Internal Medicine

## 2023-04-17 ENCOUNTER — Ambulatory Visit: Payer: Self-pay | Admitting: Internal Medicine

## 2023-05-13 ENCOUNTER — Ambulatory Visit: Payer: Self-pay | Admitting: Internal Medicine

## 2023-05-15 ENCOUNTER — Emergency Department (HOSPITAL_COMMUNITY): Payer: 59

## 2023-05-15 ENCOUNTER — Encounter (HOSPITAL_COMMUNITY): Payer: Self-pay

## 2023-05-15 ENCOUNTER — Other Ambulatory Visit: Payer: Self-pay

## 2023-05-15 ENCOUNTER — Emergency Department (HOSPITAL_COMMUNITY)
Admission: EM | Admit: 2023-05-15 | Discharge: 2023-05-16 | Disposition: A | Discharge status: Home / Self Care | Payer: 59 | Attending: Student | Admitting: Student

## 2023-05-15 DIAGNOSIS — Z7984 Long term (current) use of oral hypoglycemic drugs: Secondary | ICD-10-CM | POA: Insufficient documentation

## 2023-05-15 DIAGNOSIS — Z79899 Other long term (current) drug therapy: Secondary | ICD-10-CM | POA: Insufficient documentation

## 2023-05-15 DIAGNOSIS — M549 Dorsalgia, unspecified: Secondary | ICD-10-CM | POA: Diagnosis not present

## 2023-05-15 DIAGNOSIS — M545 Low back pain, unspecified: Secondary | ICD-10-CM | POA: Diagnosis not present

## 2023-05-15 DIAGNOSIS — Z7982 Long term (current) use of aspirin: Secondary | ICD-10-CM | POA: Diagnosis not present

## 2023-05-15 DIAGNOSIS — M5432 Sciatica, left side: Secondary | ICD-10-CM | POA: Insufficient documentation

## 2023-05-15 DIAGNOSIS — M5442 Lumbago with sciatica, left side: Secondary | ICD-10-CM | POA: Diagnosis not present

## 2023-05-15 DIAGNOSIS — E119 Type 2 diabetes mellitus without complications: Secondary | ICD-10-CM | POA: Insufficient documentation

## 2023-05-15 DIAGNOSIS — I10 Essential (primary) hypertension: Secondary | ICD-10-CM | POA: Diagnosis not present

## 2023-05-15 DIAGNOSIS — Z8673 Personal history of transient ischemic attack (TIA), and cerebral infarction without residual deficits: Secondary | ICD-10-CM | POA: Diagnosis not present

## 2023-05-15 LAB — CBC WITH DIFFERENTIAL/PLATELET
Abs Immature Granulocytes: 0.01 10*3/uL (ref 0.00–0.07)
Basophils Absolute: 0 10*3/uL (ref 0.0–0.1)
Basophils Relative: 0 %
Eosinophils Absolute: 0.2 10*3/uL (ref 0.0–0.5)
Eosinophils Relative: 3 %
HCT: 40.4 % (ref 36.0–46.0)
Hemoglobin: 12.9 g/dL (ref 12.0–15.0)
Immature Granulocytes: 0 %
Lymphocytes Relative: 39 %
Lymphs Abs: 2.9 10*3/uL (ref 0.7–4.0)
MCH: 27.3 pg (ref 26.0–34.0)
MCHC: 31.9 g/dL (ref 30.0–36.0)
MCV: 85.6 fL (ref 80.0–100.0)
Monocytes Absolute: 0.6 10*3/uL (ref 0.1–1.0)
Monocytes Relative: 8 %
Neutro Abs: 3.6 10*3/uL (ref 1.7–7.7)
Neutrophils Relative %: 50 %
Platelets: 283 10*3/uL (ref 150–400)
RBC: 4.72 MIL/uL (ref 3.87–5.11)
RDW: 13.7 % (ref 11.5–15.5)
WBC: 7.3 10*3/uL (ref 4.0–10.5)
nRBC: 0 % (ref 0.0–0.2)

## 2023-05-15 LAB — URINALYSIS, ROUTINE W REFLEX MICROSCOPIC
Bilirubin Urine: NEGATIVE
Glucose, UA: NEGATIVE mg/dL
Hgb urine dipstick: NEGATIVE
Ketones, ur: NEGATIVE mg/dL
Nitrite: NEGATIVE
Protein, ur: 30 mg/dL — AB
Specific Gravity, Urine: 1.029 (ref 1.005–1.030)
pH: 5 (ref 5.0–8.0)

## 2023-05-15 LAB — COMPREHENSIVE METABOLIC PANEL
ALT: 16 U/L (ref 0–44)
AST: 15 U/L (ref 15–41)
Albumin: 4 g/dL (ref 3.5–5.0)
Alkaline Phosphatase: 108 U/L (ref 38–126)
Anion gap: 11 (ref 5–15)
BUN: 21 mg/dL — ABNORMAL HIGH (ref 6–20)
CO2: 24 mmol/L (ref 22–32)
Calcium: 9.1 mg/dL (ref 8.9–10.3)
Chloride: 104 mmol/L (ref 98–111)
Creatinine, Ser: 0.69 mg/dL (ref 0.44–1.00)
GFR, Estimated: >60 mL/min (ref >60)
Glucose, Bld: 101 mg/dL — ABNORMAL HIGH (ref 70–99)
Potassium: 3.2 mmol/L — ABNORMAL LOW (ref 3.5–5.1)
Sodium: 139 mmol/L (ref 135–145)
Total Bilirubin: 0.4 mg/dL (ref 0.3–1.2)
Total Protein: 7.4 g/dL (ref 6.5–8.1)

## 2023-05-15 MED ORDER — KETOROLAC TROMETHAMINE 15 MG/ML IJ SOLN
15.0000 mg | Freq: Once | INTRAMUSCULAR | Status: AC
  Administered 2023-05-15: 15 mg via INTRAMUSCULAR
  Filled 2023-05-15: qty 1

## 2023-05-15 MED ORDER — LIDOCAINE 5 % EX PTCH
1.0000 | MEDICATED_PATCH | CUTANEOUS | Status: DC
  Administered 2023-05-15: 1 via TRANSDERMAL
  Filled 2023-05-15: qty 1

## 2023-05-15 NOTE — ED Provider Notes (Signed)
EMERGENCY DEPARTMENT AT [facility] Provider Note  CSN: [id number] Arrival date & time: 05/15/23 1715  Chief Complaint(s) Back Pain  HPI Erin Murray is a 52 y.o. female with PMH T2DM, HTN, HLD, GERD, seizures, previous TIA who presents emergency room for evaluation of back pain.  States pain is primarily on the left and radiates down into the groin and down the upper thigh on the left.  Denies associated nausea, vomiting, chest pain, shortness of breath or other systemic symptoms.  No previous trauma or heavy lifting prior to onset of pain.  Has never had this pain before.   Past Medical History Past Medical History:  Diagnosis Date   Complicated migraine    includes: visual changes, left sided weakness, confusion, weakness, dizziness   Dysrhythmia    Fibromyalgia    Fibromyalgia    GERD (gastroesophageal reflux disease)    Headache(784.0)    Hypertension    Neurological disorder    unnamed neurological disorder   Reflux    Seizures (HCC)    Tachycardia    TIA (transient ischemic attack) 07/15/2008   Patient Active Problem List   Diagnosis Date Noted   Intermittent diarrhea 04/01/2023   Type 2 diabetes mellitus without complications (HCC) 01/29/2023   Hyperlipidemia associated with type 2 diabetes mellitus (HCC) 01/29/2023   Closed nondisplaced fracture of lateral malleolus of right fibula 01/01/2023   Prediabetes 01/01/2023   Vitamin D insufficiency 01/01/2023   Obesity (BMI 30-39.9) 01/01/2023   Fibromyalgia 10/15/2022   Iron deficiency anemia due to chronic blood loss 10/15/2022   Arthritis of right acromioclavicular joint 10/15/2022   Adhesive capsulitis of shoulder 10/15/2022   Cyst of right ovary 10/15/2022   Enlarged uterus 10/15/2022   Hirsutism 10/15/2022   Traumatic arthropathy of the shoulder region 10/15/2022   Lumbar back pain with radiculopathy affecting lower extremity 09/23/2022   History of recent fall 06/26/2022   Tachycardia  06/04/2022   Bilateral leg paresthesia 06/04/2022   Encounter for general adult medical examination with abnormal findings 06/04/2022   Left upper quadrant abdominal pain 06/04/2022   Sprain of wrist joint, left, subsequent encounter 10/11/2021   Hand trauma, left, subsequent encounter 10/11/2021   Acid reflux 12/27/2020   Insomnia 12/27/2020   Peripheral nerve disease 12/27/2020   Hypertension 02/06/2019   Abnormal uterine bleeding (AUB) 04/01/2018   Reflux    Headache    TIA (transient ischemic attack)    Seizures (HCC)    Migraine    Home Medication(s) Prior to Admission medications   Medication Sig Start Date End Date Taking? Authorizing Provider  HYDROcodone-acetaminophen (NORCO) 5-325 MG tablet Take 1-2 tablets by mouth every 6 (six) hours as needed. 05/16/23  Yes Delo, Riley Lam, MD  predniSONE (DELTASONE) 10 MG tablet Take 2 tablets (20 mg total) by mouth 2 (two) times daily. 05/16/23  Yes Geoffery Lyons, MD  aspirin EC 81 MG tablet Take 81 mg by mouth daily.    [provider]  atorvastatin (LIPITOR) 20 MG tablet Take 1 tablet (20 mg total) by mouth daily. 01/29/23   Billie Lade, MD  cyclobenzaprine (FLEXERIL) 10 MG tablet Take 1 tablet (10 mg total) by mouth 3 (three) times daily as needed for muscle spasms. Do not drink alcohol or drive while taking this medication.  May cause drowsiness. 06/20/22   Particia Nearing, PA-C  Dulaglutide (TRULICITY) 0.75 MG/0.5ML SOPN Inject 0.75 mg into the skin once a week. 02/07/23   Billie Lade, MD  fluticasone (FLONASE) 50 MCG/ACT nasal spray Place 1 spray into both nostrils 2 (two) times daily. 07/23/22   Particia Nearing, PA-C  lisinopril (ZESTRIL) 5 MG tablet Take 1 tablet (5 mg total) by mouth daily. 01/29/23   Billie Lade, MD  melatonin 5 MG TABS Take 20 mg by mouth at bedtime as needed.    [provider]  metoprolol tartrate (LOPRESSOR) 50 MG tablet Take 1 tablet by mouth twice daily 03/24/23   Billie Lade, MD  nitrofurantoin, macrocrystal-monohydrate, (MACROBID) 100 MG capsule Take 100 mg by mouth 2 (two) times daily. 01/26/23   [provider]  nitroGLYCERIN (NITROSTAT) 0.4 MG SL tablet Place 1 tablet (0.4 mg total) under the tongue every 5 (five) minutes as needed for chest pain. 08/26/19 01/01/23  Laqueta Linden, MD  ondansetron (ZOFRAN) 4 MG tablet Take 1 tablet (4 mg total) by mouth every 8 (eight) hours as needed for nausea or vomiting. 02/03/23   Billie Lade, MD  pantoprazole (PROTONIX) 40 MG tablet Take 1 tablet by mouth twice daily 01/24/23   Billie Lade, MD  rizatriptan (MAXALT-MLT) 10 MG disintegrating tablet Take 1 tablet (10 mg total) by mouth as needed for migraine. May repeat in 2 hours if needed 06/04/22   Billie Lade, MD  topiramate (TOPAMAX) 50 MG tablet Take 2 tablets (100 mg total) by mouth 2 (two) times daily. One po bid xone week, then 2 tabs po bid 06/04/22   Billie Lade, MD  vitamin B-12 (CYANOCOBALAMIN) 1000 MCG tablet Take 5,000 mcg by mouth daily.    [provider]                                                                                                                                    Past Surgical History Past Surgical History:  Procedure Laterality Date   BIOPSY  08/19/2022   Procedure: BIOPSY;  Surgeon: Lanelle Bal, DO;  Location: [facility];  Service: Endoscopy;;   CHOLECYSTECTOMY     COLONOSCOPY     2015 per patient. done in [place], [place].   COLONOSCOPY WITH PROPOFOL N/A 08/19/2022   Procedure: COLONOSCOPY WITH PROPOFOL;  Surgeon: Lanelle Bal, DO;  Location: [facility];  Service: Endoscopy;  Laterality: N/A;  10:15am, asa 3   ESOPHAGOGASTRODUODENOSCOPY N/A 10/14/2014   Procedure: ESOPHAGOGASTRODUODENOSCOPY (EGD);  Surgeon: Malissa Hippo, MD;  Location: [facility];  Service: Endoscopy;  Laterality: N/A;  210 - moved to 12:45 - Ann to notify pt   ESOPHAGOGASTRODUODENOSCOPY (EGD) WITH PROPOFOL  N/A 08/19/2022   Procedure: ESOPHAGOGASTRODUODENOSCOPY (EGD) WITH PROPOFOL;  Surgeon: Lanelle Bal, DO;  Location: [facility];  Service: Endoscopy;  Laterality: N/A;   OVARIAN CYST DRAINAGE     POLYPECTOMY  08/19/2022   Procedure: POLYPECTOMY;  Surgeon: Lanelle Bal, DO;  Location: [facility];  Service: Endoscopy;;   TUBAL  LIGATION     Family History Family History  Problem Relation Age of Onset   High Cholesterol Mother    Asthma Mother    High Cholesterol Father    Heart disease Father    Colon cancer Father 42   Colon cancer Sister    Breast cancer Sister    Cancer Maternal Uncle    Lung cancer Maternal Uncle    COPD Maternal Grandfather     Social History Social History   Tobacco Use   Smoking status: Never    Passive exposure: Never   Smokeless tobacco: Never  Vaping Use   Vaping status: Never Used  Substance Use Topics   Alcohol use: No   Drug use: No   Allergies Sulfa antibiotics and Penicillins  Review of Systems Review of Systems  Genitourinary:  Positive for flank pain.  Musculoskeletal:  Positive for back pain.    Physical Exam Vital Signs  I have reviewed the triage vital signs BP (!) 103/57   Pulse 71   Temp 98.3 F (36.8 C)   Resp 17   Ht 5\' 6"  (1.676 m)   Wt 90.7 kg   LMP 08/24/2019   SpO2 98%   BMI 32.28 kg/m   Physical Exam Vitals and nursing note reviewed.  Constitutional:      General: She is not in acute distress.    Appearance: She is well-developed.  HENT:     Head: Normocephalic and atraumatic.  Eyes:     Conjunctiva/sclera: Conjunctivae normal.  Cardiovascular:     Rate and Rhythm: Normal rate and regular rhythm.     Heart sounds: No murmur heard. Pulmonary:     Effort: Pulmonary effort is normal. No respiratory distress.     Breath sounds: Normal breath sounds.  Abdominal:     Palpations: Abdomen is soft.     Tenderness: There is no abdominal tenderness. There is left CVA tenderness.  Musculoskeletal:         General: No swelling.     Cervical back: Neck supple.  Skin:    General: Skin is warm and dry.     Capillary Refill: Capillary refill takes less than 2 seconds.  Neurological:     Mental Status: She is alert.  Psychiatric:        Mood and Affect: Mood normal.     ED Results and Treatments Labs (all labs ordered are listed, but only abnormal results are displayed) Labs Reviewed  COMPREHENSIVE METABOLIC PANEL - Abnormal; Notable for the following components:      Result Value   Potassium 3.2 (*)    Glucose, Bld 101 (*)    BUN 21 (*)    All other components within normal limits  URINALYSIS, ROUTINE W REFLEX MICROSCOPIC - Abnormal; Notable for the following components:   APPearance HAZY (*)    Protein, ur 30 (*)    Leukocytes,Ua MODERATE (*)    Bacteria, UA RARE (*)    All other components within normal limits  CBC WITH DIFFERENTIAL/PLATELET  Radiology CT Renal Stone Study  Result Date: 05/16/2023 CLINICAL DATA:  Pain to left side of back, left hip radiating down the left leg. Started 3 days ago. EXAM: CT ABDOMEN AND PELVIS WITHOUT CONTRAST CT Lumbar Spine without contrast TECHNIQUE: Technique: Multiplanar CT images of the lumbar spine were reconstructed from contemporary CT of the Abdomen and Pelvis. Multidetector CT imaging of the abdomen and pelvis was performed following the standard protocol without IV contrast. RADIATION DOSE REDUCTION: This exam was performed according to the departmental dose-optimization program which includes automated exposure control, adjustment of the mA and/or kV according to patient size and/or use of iterative reconstruction technique. COMPARISON:  MRI lumbar spine 02/06/2023; lumbar spine radiographs 10/17/2022; CT abdomen and pelvis 02/17/2022 FINDINGS: ABDOMEN/PELVIS: Lower chest: No acute abnormality. Hepatobiliary:  Cholecystectomy. Unremarkable noncontrast appearance of liver and biliary tree. Pancreas: Unremarkable. Spleen: Unremarkable. Adrenals/Urinary Tract: Stable adrenal glands. No urinary calculi or hydronephrosis. Unremarkable bladder. Stomach/Bowel: No bowel obstruction or bowel wall thickening. Stomach is within normal limits. The appendix is normal. Vascular/Lymphatic: No significant vascular findings are present. No enlarged abdominal or pelvic lymph nodes. Reproductive: Uterus and bilateral adnexa are unremarkable. Right tubal ligation clips. Other: No free intraperitoneal fluid or air. Musculoskeletal: No acute fracture in the pelvis. LUMBAR SPINE: Segmentation: 5 lumbar type vertebrae. Alignment: Normal. Vertebrae: No acute fracture. Paraspinal and other soft tissues: See above. Disc levels: Intervertebral disc space height is maintained. No significant spinal canal or neural foraminal narrowing. Unchanged severe facet arthropathy at L4-L5. IMPRESSION: 1. No acute abnormality in the abdomen or pelvis. 2. No acute fracture or traumatic malalignment of the lumbar spine. Electronically Signed   By: Minerva Fester M.D.   On: 05/16/2023 01:13   CT L-SPINE NO CHARGE  Result Date: 05/16/2023 CLINICAL DATA:  Pain to left side of back, left hip radiating down the left leg. Started 3 days ago. EXAM: CT ABDOMEN AND PELVIS WITHOUT CONTRAST CT Lumbar Spine without contrast TECHNIQUE: Technique: Multiplanar CT images of the lumbar spine were reconstructed from contemporary CT of the Abdomen and Pelvis. Multidetector CT imaging of the abdomen and pelvis was performed following the standard protocol without IV contrast. RADIATION DOSE REDUCTION: This exam was performed according to the departmental dose-optimization program which includes automated exposure control, adjustment of the mA and/or kV according to patient size and/or use of iterative reconstruction technique. COMPARISON:  MRI lumbar spine 02/06/2023; lumbar  spine radiographs 10/17/2022; CT abdomen and pelvis 02/17/2022 FINDINGS: ABDOMEN/PELVIS: Lower chest: No acute abnormality. Hepatobiliary: Cholecystectomy. Unremarkable noncontrast appearance of liver and biliary tree. Pancreas: Unremarkable. Spleen: Unremarkable. Adrenals/Urinary Tract: Stable adrenal glands. No urinary calculi or hydronephrosis. Unremarkable bladder. Stomach/Bowel: No bowel obstruction or bowel wall thickening. Stomach is within normal limits. The appendix is normal. Vascular/Lymphatic: No significant vascular findings are present. No enlarged abdominal or pelvic lymph nodes. Reproductive: Uterus and bilateral adnexa are unremarkable. Right tubal ligation clips. Other: No free intraperitoneal fluid or air. Musculoskeletal: No acute fracture in the pelvis. LUMBAR SPINE: Segmentation: 5 lumbar type vertebrae. Alignment: Normal. Vertebrae: No acute fracture. Paraspinal and other soft tissues: See above. Disc levels: Intervertebral disc space height is maintained. No significant spinal canal or neural foraminal narrowing. Unchanged severe facet arthropathy at L4-L5. IMPRESSION: 1. No acute abnormality in the abdomen or pelvis. 2. No acute fracture or traumatic malalignment of the lumbar spine. Electronically Signed   By: Minerva Fester M.D.   On: 05/16/2023 01:13    Pertinent labs & imaging results that were available during my  care of the patient were reviewed by me and considered in my medical decision making (see MDM for details).  Medications Ordered in ED Medications  lidocaine (LIDODERM) 5 % 1 patch (1 patch Transdermal Patch Applied 05/15/23 2253)  ketorolac (TORADOL) 15 MG/ML injection 15 mg (15 mg Intramuscular Given 05/15/23 2253)  HYDROcodone-acetaminophen (NORCO/VICODIN) 5-325 MG per tablet 2 tablet (2 tablets Oral Given 05/16/23 0132)  predniSONE (DELTASONE) tablet 40 mg (40 mg Oral Given 05/16/23 0132)                                                                                                                                      Procedures Procedures  (including critical care time)  Medical Decision Making / ED Course   This patient presents to the ED for concern of back pain, flank pain, this involves an extensive number of treatment options, and is a complaint that carries with it a high risk of complications and morbidity.  The differential diagnosis includes nephrolithiasis, pyelonephritis, obstruction, AAA, musculoskeletal strain, vertebral fracture, intra-abdominal abscess, diverticulitis, sciatica  MDM: Patient seen emergency room for evaluation of back and flank pain.  Physical exam with tenderness over the left CVA/left paraspinal muscles of the lumbar spine.  Neurologic exam otherwise unremarkable.  At time of signout, patient pending laboratory evaluation and imaging studies.  Please see provider signout for continuation of workup.  Given CVA tenderness that radiates into the groin, will also evaluate for stone as well as sciatica.   Additional history obtained:  -External records from outside source obtained and reviewed including: Chart review including previous notes, labs, imaging, consultation notes   Lab Tests: -I ordered, reviewed, and interpreted labs.   The pertinent results include:   Labs Reviewed  COMPREHENSIVE METABOLIC PANEL - Abnormal; Notable for the following components:      Result Value   Potassium 3.2 (*)    Glucose, Bld 101 (*)    BUN 21 (*)    All other components within normal limits  URINALYSIS, ROUTINE W REFLEX MICROSCOPIC - Abnormal; Notable for the following components:   APPearance HAZY (*)    Protein, ur 30 (*)    Leukocytes,Ua MODERATE (*)    Bacteria, UA RARE (*)    All other components within normal limits  CBC WITH DIFFERENTIAL/PLATELET         Imaging Studies ordered: I ordered imaging studies including CT L-spine, CT stone and these are pending    Medicines ordered and prescription drug  management: Meds ordered this encounter  Medications   ketorolac (TORADOL) 15 MG/ML injection 15 mg   lidocaine (LIDODERM) 5 % 1 patch   HYDROcodone-acetaminophen (NORCO/VICODIN) 5-325 MG per tablet 2 tablet   predniSONE (DELTASONE) tablet 40 mg   predniSONE (DELTASONE) 10 MG tablet    Sig: Take 2 tablets (20 mg total) by mouth 2 (two) times daily.    Dispense:  20 tablet  Refill:  0   HYDROcodone-acetaminophen (NORCO) 5-325 MG tablet    Sig: Take 1-2 tablets by mouth every 6 (six) hours as needed.    Dispense:  12 tablet    Refill:  0    -I have reviewed the patients home medicines and have made adjustments as needed  Critical interventions none    Cardiac Monitoring: The patient was maintained on a cardiac monitor.  I personally viewed and interpreted the cardiac monitored which showed an underlying rhythm of: NSR  Social Determinants of Health:  Factors impacting patients care include: none   Reevaluation: After the interventions noted above, I reevaluated the patient and found that they have :improved  Co morbidities that complicate the patient evaluation  Past Medical History:  Diagnosis Date   Complicated migraine    includes: visual changes, left sided weakness, confusion, weakness, dizziness   Dysrhythmia    Fibromyalgia    Fibromyalgia    GERD (gastroesophageal reflux disease)    Headache(784.0)    Hypertension    Neurological disorder    unnamed neurological disorder   Reflux    Seizures (HCC)    Tachycardia    TIA (transient ischemic attack) 07/15/2008      Dispostion: I considered admission for this patient, and disposition pending completion of lab and imaging studies.  Please see provider signout note continuation of workup.     Final Clinical Impression(s) / ED Diagnoses Final diagnoses:  Sciatica of left side     @PCDICTATION @    Caylea Foronda, Wyn Forster, MD 05/16/23 [phone]

## 2023-05-15 NOTE — ED Triage Notes (Signed)
Pain to left side of back, left hip radiating down left left. Pain started about three days ago.

## 2023-05-16 ENCOUNTER — Encounter: Payer: Self-pay | Admitting: Internal Medicine

## 2023-05-16 DIAGNOSIS — M545 Low back pain, unspecified: Secondary | ICD-10-CM | POA: Diagnosis not present

## 2023-05-16 MED ORDER — HYDROCODONE-ACETAMINOPHEN 5-325 MG PO TABS
2.0000 | ORAL_TABLET | Freq: Once | ORAL | Status: AC
  Administered 2023-05-16: 2 via ORAL
  Filled 2023-05-16: qty 2

## 2023-05-16 MED ORDER — HYDROCODONE-ACETAMINOPHEN 5-325 MG PO TABS: 1.0000 | ORAL_TABLET | Freq: Four times a day (QID) | ORAL | 0 refills | Status: DC | PRN

## 2023-05-16 MED ORDER — PREDNISONE 20 MG PO TABS
40.0000 mg | ORAL_TABLET | Freq: Once | ORAL | Status: AC
  Administered 2023-05-16: 40 mg via ORAL
  Filled 2023-05-16: qty 2

## 2023-05-16 MED ORDER — PREDNISONE 10 MG PO TABS: 20.0000 mg | ORAL_TABLET | Freq: Two times a day (BID) | ORAL | 0 refills | Status: DC

## 2023-05-16 NOTE — ED Provider Notes (Signed)
  Physical Exam  BP 132/86 (BP Location: Left Arm)   Pulse 85   Temp 98.3 F (36.8 C)   Resp 17   Ht 5\' 6"  (1.676 m)   Wt 90.7 kg   LMP 08/24/2019   SpO2 98%   BMI 32.28 kg/m   Physical Exam Vitals and nursing note reviewed.  Constitutional:      Appearance: Normal appearance.  Pulmonary:     Effort: Pulmonary effort is normal.  Musculoskeletal:     Comments: There is tenderness to palpation in the soft tissues of the left lower lumbar region.  Skin:    General: Skin is warm and dry.  Neurological:     Mental Status: She is alert.     Procedures  Procedures  ED Course / MDM    Medical Decision Making Amount and/or Complexity of Data Reviewed Labs: ordered. Radiology: ordered.  Risk Prescription drug management.    Care assumed from Dr. Posey Rea at shift change.  Patient presenting here with complaints of low back pain radiating around into her thigh for the past 3 days.  Symptoms began in the absence of any injury or trauma.  Care was signed out to me awaiting results of the CT scan.  This has resulted and shows no acute intra-abdominal process and no spine abnormality.  Patient continues to complain of pain despite the Toradol she was given earlier.  Patient will be given a dose of prednisone along with hydrocodone.  I suspect patient may be experiencing sciatica and will have her follow-up with primary doctor if steroids and pain medications do not help.      Geoffery Lyons, MD 05/16/23 [phone]

## 2023-05-16 NOTE — Discharge Instructions (Signed)
Begin taking prednisone as prescribed.  Begin taking hydrocodone as prescribed as needed for pain.  Follow-up with your primary doctor if symptoms are not improving in the next week. 

## 2023-05-16 NOTE — ED Notes (Signed)
Patient transported to CT 

## 2023-05-19 ENCOUNTER — Encounter: Payer: Self-pay | Admitting: Family Medicine

## 2023-05-19 ENCOUNTER — Ambulatory Visit (INDEPENDENT_AMBULATORY_CARE_PROVIDER_SITE_OTHER): Payer: 59 | Admitting: Family Medicine

## 2023-05-19 VITALS — BP 109/76 | HR 77 | Ht 66.0 in | Wt 200.1 lb

## 2023-05-19 DIAGNOSIS — M5440 Lumbago with sciatica, unspecified side: Secondary | ICD-10-CM | POA: Diagnosis not present

## 2023-05-19 DIAGNOSIS — M5416 Radiculopathy, lumbar region: Secondary | ICD-10-CM | POA: Diagnosis not present

## 2023-05-19 MED ORDER — HYDROCODONE-ACETAMINOPHEN 5-325 MG PO TABS: 1.0000 | ORAL_TABLET | Freq: Four times a day (QID) | ORAL | 0 refills | Status: DC | PRN

## 2023-05-19 MED ORDER — KETOROLAC TROMETHAMINE 60 MG/2ML IM SOLN
60.0000 mg | Freq: Once | INTRAMUSCULAR | Status: AC
Start: 2023-05-19 — End: 2023-05-19
  Administered 2023-05-19: 60 mg via INTRAMUSCULAR

## 2023-05-19 NOTE — Patient Instructions (Addendum)
        Great to see you today.  I have refilled the medication(s) we provide.    Erin Murray  [facility]  Address: [address] [place], [place] [address] Hours:   Phone: [phone]  - Please take medications as prescribed. - Follow up with your primary health provider if any health concerns arises. - If symptoms worsen please contact your primary care provider and/or visit the emergency department.

## 2023-05-19 NOTE — Progress Notes (Signed)
Established Patient Office Visit   Subjective  Patient ID: Erin Murray, female    DOB: 23-Oct-1970  Age: 52 y.o. MRN: [id number]  Chief Complaint  Patient presents with   Follow-up    10/31 ED f/u for sciatica, numbness down front of lft. leg     She  has a past medical history of Complicated migraine, Dysrhythmia, Fibromyalgia, Fibromyalgia, GERD (gastroesophageal reflux disease), Headache(784.0), Hypertension, Neurological disorder, Reflux, Seizures (HCC), Tachycardia, and TIA (transient ischemic attack) (07/15/2008).  Back Pain The patient reports chronic back pain that began one week ago and has been steadily worsening. The pain is located in the lumbar spine, described as aching and burning, with radiation to both thighs and a severity of 7/10. It is more intense during the day and aggravated by standing, sitting, bending, and lying down. Associated symptoms include leg pain, numbness, and tingling, with no bladder or bowel incontinence. Lack of exercise is noted as a risk factor. Previous treatments with prednisone and Norco have provided only mild relief.    Review of Systems  Constitutional:  Negative for chills and fever.  Eyes:  Negative for blurred vision.  Respiratory:  Negative for shortness of breath.   Cardiovascular:  Negative for chest pain.  Gastrointestinal:  Negative for abdominal pain.  Musculoskeletal:  Positive for back pain and myalgias.  Neurological:  Negative for dizziness and headaches.      Objective:     BP 109/76   Pulse 77   Ht 5\' 6"  (1.676 m)   Wt 200 lb 1.3 oz (90.8 kg)   LMP 08/24/2019   SpO2 94%   BMI 32.29 kg/m  BP Readings from Last 3 Encounters:  05/19/23 109/76  05/16/23 (!) 103/57  04/04/23 132/81      Physical Exam Vitals reviewed.  Constitutional:      General: She is not in acute distress.    Appearance: Normal appearance. She is not ill-appearing, toxic-appearing or diaphoretic.  HENT:     Head: Normocephalic.   Eyes:     General:        Right eye: No discharge.        Left eye: No discharge.     Conjunctiva/sclera: Conjunctivae normal.  Cardiovascular:     Rate and Rhythm: Normal rate.     Pulses: Normal pulses.     Heart sounds: Normal heart sounds.  Pulmonary:     Effort: Pulmonary effort is normal. No respiratory distress.     Breath sounds: Normal breath sounds.  Musculoskeletal:     Cervical back: Normal range of motion.     Lumbar back: Tenderness present. Decreased range of motion. Positive right straight leg raise test and positive left straight leg raise test.  Skin:    General: Skin is warm and dry.  Neurological:     Mental Status: She is alert.  Psychiatric:        Mood and Affect: Mood normal.        Behavior: Behavior normal.      No results found for any visits on 05/19/23.  The 10-year ASCVD risk score (Arnett DK, et al., 2019) is: 4.2%    Assessment & Plan:  Back pain of lumbar region with sciatica -     Ambulatory referral to Physical Therapy -     Ketorolac Tromethamine  Lumbar back pain with radiculopathy affecting lower extremity Assessment & Plan: Toradol injection IM 60 mg given today Refilled Norco 5-325 mg PRN We discussed the desired effects  and potential side effects of the prescribed medication for back pain. Additionally, we reviewed non-pharmacological interventions, including the importance of rest, avoiding twisting, improper bending, and straining the lower back. I demonstrated proper body mechanics to prevent further injury and advised alternating between ice and heat therapy for relief. Stretching exercises for both the back and legs were recommended to improve flexibility and support recovery. The patient was advised to follow up if symptoms worsen or persist. The patient expressed understanding of the treatment plan, and all questions were thoroughly addressed.   Orders: -     HYDROcodone-Acetaminophen; Take 1 tablet by mouth every 6 (six)  hours as needed.  Dispense: 10 tablet; Refill: 0    Return if symptoms worsen or fail to improve.   Cruzita Lederer Newman Nip, FNP

## 2023-05-19 NOTE — Assessment & Plan Note (Signed)
Toradol injection IM 60 mg given today Refilled Norco 5-325 mg PRN We discussed the desired effects and potential side effects of the prescribed medication for back pain. Additionally, we reviewed non-pharmacological interventions, including the importance of rest, avoiding twisting, improper bending, and straining the lower back. I demonstrated proper body mechanics to prevent further injury and advised alternating between ice and heat therapy for relief. Stretching exercises for both the back and legs were recommended to improve flexibility and support recovery. The patient was advised to follow up if symptoms worsen or persist. The patient expressed understanding of the treatment plan, and all questions were thoroughly addressed.

## 2023-05-22 ENCOUNTER — Ambulatory Visit (HOSPITAL_COMMUNITY): Payer: 59 | Attending: Family Medicine

## 2023-05-22 ENCOUNTER — Other Ambulatory Visit: Payer: Self-pay

## 2023-05-22 DIAGNOSIS — M5416 Radiculopathy, lumbar region: Secondary | ICD-10-CM | POA: Insufficient documentation

## 2023-05-22 DIAGNOSIS — M5440 Lumbago with sciatica, unspecified side: Secondary | ICD-10-CM | POA: Diagnosis not present

## 2023-05-22 NOTE — Therapy (Signed)
OUTPATIENT PHYSICAL THERAPY THORACOLUMBAR EVALUATION   Patient Name: Erin Murray MRN: [id number] DOB:12-23-1970, 52 y.o., female Today's Date: 05/22/2023  END OF SESSION:  PT End of Session - 05/22/23 1502     Visit Number 1    Number of Visits 8    Date for PT Re-Evaluation 06/19/23    Authorization Type [employer]    PT Start Time 0845    PT Stop Time 0930    PT Time Calculation (min) 45 min    Activity Tolerance Patient tolerated treatment well    Behavior During Therapy WFL for tasks assessed/performed            Past Medical History:  Diagnosis Date   Complicated migraine    includes: visual changes, left sided weakness, confusion, weakness, dizziness   Dysrhythmia    Fibromyalgia    Fibromyalgia    GERD (gastroesophageal reflux disease)    Headache(784.0)    Hypertension    Neurological disorder    unnamed neurological disorder   Reflux    Seizures (HCC)    Tachycardia    TIA (transient ischemic attack) 07/15/2008   Past Surgical History:  Procedure Laterality Date   BIOPSY  08/19/2022   Procedure: BIOPSY;  Surgeon: Lanelle Bal, DO;  [facility];  Service: Endoscopy;;   CHOLECYSTECTOMY     COLONOSCOPY     2015 per patient. done in [place], [place].   COLONOSCOPY WITH PROPOFOL N/A 08/19/2022   Procedure: COLONOSCOPY WITH PROPOFOL;  Surgeon: Lanelle Bal, DO;  [facility]: [facility];  Service: Endoscopy;  Laterality: N/A;  10:15am, asa 3   ESOPHAGOGASTRODUODENOSCOPY N/A 10/14/2014   Procedure: ESOPHAGOGASTRODUODENOSCOPY (EGD);  Surgeon: Malissa Hippo, MD;  [facility]: [facility];  Service: Endoscopy;  Laterality: N/A;  210 - moved to 12:45 - Ann to notify pt   ESOPHAGOGASTRODUODENOSCOPY (EGD) WITH PROPOFOL N/A 08/19/2022   Procedure: ESOPHAGOGASTRODUODENOSCOPY (EGD) WITH PROPOFOL;  Surgeon: Lanelle Bal, DO;  [facility];  Service: Endoscopy;  Laterality: N/A;   OVARIAN CYST DRAINAGE     POLYPECTOMY  08/19/2022    Procedure: POLYPECTOMY;  Surgeon: Lanelle Bal, DO;  [facility]: [facility];  Service: Endoscopy;;   TUBAL LIGATION     Patient Active Problem List   Diagnosis Date Noted   Intermittent diarrhea 04/01/2023   Type 2 diabetes mellitus without complications (HCC) 01/29/2023   Hyperlipidemia associated with type 2 diabetes mellitus (HCC) 01/29/2023   Closed nondisplaced fracture of lateral malleolus of right fibula 01/01/2023   Prediabetes 01/01/2023   Vitamin D insufficiency 01/01/2023   Obesity (BMI 30-39.9) 01/01/2023   Fibromyalgia 10/15/2022   Iron deficiency anemia due to chronic blood loss 10/15/2022   Arthritis of right acromioclavicular joint 10/15/2022   Adhesive capsulitis of shoulder 10/15/2022   Cyst of right ovary 10/15/2022   Enlarged uterus 10/15/2022   Hirsutism 10/15/2022   Traumatic arthropathy of the shoulder region 10/15/2022   Lumbar back pain with radiculopathy affecting lower extremity 09/23/2022   History of recent fall 06/26/2022   Tachycardia 06/04/2022   Bilateral leg paresthesia 06/04/2022   Encounter for general adult medical examination with abnormal findings 06/04/2022   Left upper quadrant abdominal pain 06/04/2022   Sprain of wrist joint, left, subsequent encounter 10/11/2021   Hand trauma, left, subsequent encounter 10/11/2021   Acid reflux 12/27/2020   Insomnia 12/27/2020   Peripheral nerve disease 12/27/2020   Hypertension 02/06/2019   Abnormal uterine bleeding (AUB) 04/01/2018  Reflux    Headache    TIA (transient ischemic attack)    Seizures (HCC)    Migraine     PCP: Billie Lade, MD  REFERRING PROVIDER: Rica Records, FNP  REFERRING DIAG: M54.40 (ICD-10-CM) - Back pain of lumbar region with sciatica  Rationale for Evaluation and Treatment: Rehabilitation  THERAPY DIAG:  Radiculopathy, lumbar region  ONSET DATE: 10/27-28/24  SUBJECTIVE:                                                                                                                                                                                            SUBJECTIVE STATEMENT: Arrives to the clinic with c/o low back pain that shoots to the L leg (see below). Patient states that the L LE  can get weak/numb at times. Condition started around 10/27-28/24 when she tried to pick up an infant from the floor at work. Patient heard a popping sound when she picked up an infant. Patient thought it was her hip. Patient continued with her day until the pain got worse on 05/15/23. Pain was so bad that she took herself to the ER. Patient was told it was her sciatic nerve that's causing her pain. Patient was given toradol and hydrocodone which did not seem to help. Patient went to her PCP and was given the same meds. Patient was then referred to outpatient PT evaluation and management. Patient is currently off  from work  PERTINENT HISTORY:  "[name]" neurological disorder for years, hx of R ankle fx in 12/2022, hx of stroke  PAIN:  Are you having pain? Yes: NPRS scale: 4/10 Pain location: low back that radiates to the L LE Pain description: burning, aching, constant Aggravating factors: movement of any sort Relieving factors: being still  PRECAUTIONS: None  RED FLAGS: Bowel or bladder incontinence: No, Cauda equina syndrome: No, and Compression fracture: No   WEIGHT BEARING RESTRICTIONS: No  FALLS:  Has patient fallen in last 6 months? Yes. Number of falls 1  LIVING ENVIRONMENT: Lives with:  house and son Lives in: House/apartment Stairs: Yes: Internal: 12 steps; on right going up and External: 1 steps; none Has following equipment at home:  tripod cane, standard walker  OCCUPATION: teacher in a daycare (8 hours/day, 5 days/week)  PLOF: Independent and Independent with basic ADLs  PATIENT GOALS: "range of motion and be back to work"  NEXT MD VISIT: unknown  OBJECTIVE:  Note: Objective measures were completed at Evaluation unless  otherwise noted.  DIAGNOSTIC FINDINGS:  02/06/23 MRI LUMBAR SPINE WITHOUT CONTRAST   TECHNIQUE: Multiplanar, multisequence MR imaging of the lumbar spine was performed.  No intravenous contrast was administered.   COMPARISON:  None Available.   FINDINGS: Segmentation:  Standard.   Alignment:  Normal.   Vertebrae:  No fracture, evidence of discitis, or bone lesion.   Conus medullaris and cauda equina: Conus extends to the L1 level. Conus and cauda equina appear normal.   Paraspinal and other soft tissues: Negative.   Disc levels:   T11-12 is imaged in the sagittal plane only and negative.   T12-L1: Negative.   L1-2: Negative.   L2-3: Negative.   L3-4: Negative.   L4-5: Mild to moderate facet degenerative change with some ligamentum flavum thickening and a shallow disc bulge. There is mild narrowing in the subarticular recesses. The foramina are widely patent.   L5-S1: Minimal disc bulge without stenosis.   IMPRESSION: 1. Mild narrowing in the subarticular recesses at L4-5 due to facet and ligamentum flavum hypertrophy and a shallow disc bulge. The foramina are widely patent. No nerve root compression is identified. 2. Minimal disc bulge L5-S1 without stenosis.  05/15/23 CT ABDOMEN AND PELVIS WITHOUT CONTRAST   CT Lumbar Spine without contrast   TECHNIQUE: Technique: Multiplanar CT images of the lumbar spine were reconstructed from contemporary CT of the Abdomen and Pelvis.   Multidetector CT imaging of the abdomen and pelvis was performed following the standard protocol without IV contrast.   RADIATION DOSE REDUCTION: This exam was performed according to the departmental dose-optimization program which includes automated exposure control, adjustment of the mA and/or kV according to patient size and/or use of iterative reconstruction technique.   COMPARISON:  MRI lumbar spine 02/06/2023; lumbar spine radiographs 10/17/2022; CT abdomen and pelvis  02/17/2022   FINDINGS: ABDOMEN/PELVIS:   Lower chest: No acute abnormality.   Hepatobiliary: Cholecystectomy. Unremarkable noncontrast appearance of liver and biliary tree.   Pancreas: Unremarkable.   Spleen: Unremarkable.   Adrenals/Urinary Tract: Stable adrenal glands. No urinary calculi or hydronephrosis. Unremarkable bladder.   Stomach/Bowel: No bowel obstruction or bowel wall thickening. Stomach is within normal limits. The appendix is normal.   Vascular/Lymphatic: No significant vascular findings are present. No enlarged abdominal or pelvic lymph nodes.   Reproductive: Uterus and bilateral adnexa are unremarkable. Right tubal ligation clips.   Other: No free intraperitoneal fluid or air.   Musculoskeletal: No acute fracture in the pelvis.   LUMBAR SPINE:   Segmentation: 5 lumbar type vertebrae.   Alignment: Normal.   Vertebrae: No acute fracture.   Paraspinal and other soft tissues: See above.   Disc levels: Intervertebral disc space height is maintained. No significant spinal canal or neural foraminal narrowing. Unchanged severe facet arthropathy at L4-L5.   IMPRESSION: 1. No acute abnormality in the abdomen or pelvis. 2. No acute fracture or traumatic malalignment of the lumbar spine  PATIENT SURVEYS:  FOTO 48.44  SCREENING FOR RED FLAGS: Bowel or bladder incontinence: No Cauda equina syndrome: No Compression fracture: No  COGNITION: Overall cognitive status: Within functional limits for tasks assessed     SENSATION: Light touch: WFL on B LE  MUSCLE LENGTH: Hamstrings: moderate restriction on B Thomas test: mild restriction on B Piriformis test: moderate restriction on B  POSTURE: rounded shoulders, forward head, and increased lumbar lordosis  PALPATION: Grade 1 tenderness and moderate muscle spasm on B paralumbars and glutes  LUMBAR ROM:   AROM eval  Flexion 75%  Extension 100%  Right lateral flexion   Left lateral flexion    Right rotation   Left rotation    (Blank rows = not tested) *  repeated flexion/ext worsened the pain on the low back particularly ext  LOWER EXTREMITY ROM:     Active  Right eval Left eval  Hip flexion Around 25% limited due to weakness Around 25% limited due to weakness  Hip extension [facility]  Hip abduction [facility]  Hip adduction    Hip internal rotation ~90% limited ~90% limited  Hip external rotation    Knee flexion [facility]  Knee extension [facility]  Ankle dorsiflexion [facility]  Ankle plantarflexion [facility] WFL  Ankle inversion    Ankle eversion     (Blank rows = not tested)  LOWER EXTREMITY MMT:    MMT Right eval Left eval  Hip flexion 3- 3-  Hip extension 4 3+  Hip abduction 3+ 3+  Hip adduction    Hip internal rotation    Hip external rotation    Knee flexion 4- 4-  Knee extension 4- 4-  Ankle dorsiflexion 4+ 4+  Ankle plantarflexion 4+ 4+  Ankle inversion    Ankle eversion     (Blank rows = not tested)  LUMBAR SPECIAL TESTS:  Straight leg raise test: Positive, Slump test: Positive, and FABER test: Positive  FUNCTIONAL TESTS:  5 times sit to stand: 15.68 sec 2 minute walk test: 330 ft  GAIT: Distance walked: 330 ft Assistive device utilized: None Level of assistance: Complete Independence Comments: slow cadence, decreased swing phase on B  TODAY'S TREATMENT:                                                                                                                              DATE:  05/22/23 Evaluation and patient education done    PATIENT EDUCATION:  Education details: Educated on the pathoanatomy of low back pain. Educated on the goals and course of rehab.  Person educated: Patient Education method: Explanation Education comprehension: verbalized understanding  HOME EXERCISE PROGRAM: None provided to date  ASSESSMENT:  CLINICAL IMPRESSION: Patient is a 52 y.o. female who was seen today for physical therapy evaluation and treatment for  back pain of the lumbar region with sciatica. Patient's condition is further defined by difficulty with bending over and walking due to pain, weakness, and decreased soft tissue extensibility. Skilled PT is required to address the impairments and functional limitations listed below. In addition, patient is (+) for FABER test on B, which could imply possible hip pathology. Imaging may be necessary to further investigate structural deficits which could alter patient's function. Messaged provider on Epic Secure chat to request for a possibility of any imaging done to the hip.   OBJECTIVE IMPAIRMENTS: Abnormal gait, decreased activity tolerance, decreased balance, decreased mobility, difficulty walking, decreased ROM, decreased strength, impaired flexibility, and pain.   ACTIVITY LIMITATIONS: carrying, lifting, bending, sitting, standing, squatting, stairs, and transfers  PARTICIPATION LIMITATIONS: meal prep, cleaning, laundry, driving, shopping, community activity, occupation, and yard work  PERSONAL FACTORS: 3+ comorbidities: "[name]" neurological disorder for years, hx of R  ankle fx in 12/2022, hx of stroke  are also affecting patient's functional outcome.   REHAB POTENTIAL: Fair    CLINICAL DECISION MAKING: Evolving/moderate complexity  EVALUATION COMPLEXITY: Moderate   GOALS: Goals reviewed with patient? Yes  SHORT TERM GOALS: Target date: 06/05/23  Pt will demonstrate indep in HEP to facilitate carry-over of skilled services and improve functional outcomes Goal status: INITIAL  LONG TERM GOALS: Target date: 06/19/23  Pt will increase FOTO to at least 68 in order to demonstrate significant improvement in function related to   Baseline: 48.44 Goal status: INITIAL  2.  Pt will increase by at least 40 ft in order to demonstrate clinically significant improvement in community ambulation Baseline: 330 ft Goal status: INITIAL  3.  Pt will decrease 5TSTS by at least 3 seconds in  order to demonstrate clinically significant improvement in LE strength  Baseline: 15.68 sec Goal status: INITIAL  4.  Pt will demonstrate increase in LE strength to 4 to facilitate ease and safety in ambulation Baseline: 3-/5 Goal status: INITIAL  PLAN:  PT FREQUENCY: 2x/week  PT DURATION: 4 weeks  PLANNED INTERVENTIONS: 97164- PT Re-evaluation, 97110-Therapeutic exercises, 97530- Therapeutic activity, 97112- Neuromuscular re-education, 97535- Self Care, 65784- Manual therapy, [id number]- Gait training, 97014- Electrical stimulation (unattended), Patient/Family education, Cryotherapy, and Moist heat.  PLAN FOR NEXT SESSION: Provide HEP. Begin core and hip strengthening as well as mobility activities.  Tish Frederickson. Rona Tomson, PT, DPT, OCS Board-Certified Clinical Specialist in Orthopedic PT PT Compact Privilege # ([place]): [id number] T  05/22/2023, 3:07 PM

## 2023-05-23 ENCOUNTER — Ambulatory Visit (INDEPENDENT_AMBULATORY_CARE_PROVIDER_SITE_OTHER): Payer: 59 | Admitting: Internal Medicine

## 2023-05-23 ENCOUNTER — Encounter: Payer: Self-pay | Admitting: Internal Medicine

## 2023-05-23 VITALS — BP 99/57 | HR 73 | Resp 16 | Ht 66.0 in

## 2023-05-23 DIAGNOSIS — E119 Type 2 diabetes mellitus without complications: Secondary | ICD-10-CM

## 2023-05-23 DIAGNOSIS — M5416 Radiculopathy, lumbar region: Secondary | ICD-10-CM

## 2023-05-23 DIAGNOSIS — Z23 Encounter for immunization: Secondary | ICD-10-CM | POA: Diagnosis not present

## 2023-05-23 MED ORDER — MELOXICAM 7.5 MG PO TABS
7.5000 mg | ORAL_TABLET | Freq: Every day | ORAL | 0 refills | Status: DC
Start: 2023-05-23 — End: 2023-09-29

## 2023-05-23 NOTE — Assessment & Plan Note (Signed)
Influenza vaccine administered today.

## 2023-05-23 NOTE — Assessment & Plan Note (Signed)
New diagnosis from earlier this year.  Erin Murray was prescribed but not covered by insurance.  She has focused on dietary changes aimed at weight loss and has lost 27 pounds since her last appointment.  She was congratulated on her progress. -Diabetic foot exam completed today -Repeat A1c at follow-up

## 2023-05-23 NOTE — Assessment & Plan Note (Signed)
Her acute concern today remains lumbar back pain with left-sided sciatica.  Range of motion at the waist is limited secondary to pain.  Pain has not improved with as needed use of Flexeril.  She is also using Norco as needed for severe pain relief. -Add meloxicam 7.5 mg daily -Given patient's concern about persistent symptoms, history of severe facet arthropathy noted on prior imaging, she has been referred to neurosurgery.  She is scheduled to return to work next week but does not feel she will be able to perform duties.  Will provide note recommending that she abstain from work until Monday 11/18

## 2023-05-23 NOTE — Progress Notes (Signed)
Established Patient Office Visit  Subjective   Patient ID: Erin Murray, female    DOB: 1970-12-04  Age: 52 y.o. MRN: [id number]  Chief Complaint  Patient presents with   Back Pain    Low back pain with radiation into left leg. Was picking up a baby at work at felt a pop about a week before the pain started. Went to hospital and was told it was sciatica and she has been to first PT appt. Supposed to go back to work Tues but still in pain    Erin Murray returns to care today for routine follow-up.  Last evaluated by me for an acute visit on 9/17 for evaluation of intermittent diarrhea.  In the interim, she presented to the emergency department on 9/20 endorsing diarrhea.  She then presented to the emergency department on 10/31 endorsing back pain.  Evaluated at [facility] for an acute visit on 11/4 for back pain.  Previously seen by me for routine follow-up on 7/17.  Erin Murray reports feeling poorly today.  Her acute concern is left lumbar back pain.  She started physical therapy yesterday but overall her pain has not improved.  She is supposed to return to work next week, but is concerned that she will not be able to perform work functions.  Past Medical History:  Diagnosis Date   Complicated migraine    includes: visual changes, left sided weakness, confusion, weakness, dizziness   Dysrhythmia    Fibromyalgia    Fibromyalgia    GERD (gastroesophageal reflux disease)    Headache(784.0)    Hypertension    Neurological disorder    unnamed neurological disorder   Reflux    Seizures (HCC)    Tachycardia    TIA (transient ischemic attack) 07/15/2008   Past Surgical History:  Procedure Laterality Date   BIOPSY  08/19/2022   Procedure: BIOPSY;  Surgeon: Lanelle Bal, DO;  [facility];  Service: Endoscopy;;   CHOLECYSTECTOMY     COLONOSCOPY     2015 per patient. done in [place], [place].   COLONOSCOPY WITH PROPOFOL N/A 08/19/2022   Procedure: COLONOSCOPY WITH PROPOFOL;  Surgeon:  Lanelle Bal, DO;  [facility];  Service: Endoscopy;  Laterality: N/A;  10:15am, asa 3   ESOPHAGOGASTRODUODENOSCOPY N/A 10/14/2014   Procedure: ESOPHAGOGASTRODUODENOSCOPY (EGD);  Surgeon: Malissa Hippo, MD;  [facility];  Service: Endoscopy;  Laterality: N/A;  210 - moved to 12:45 - Ann to notify pt   ESOPHAGOGASTRODUODENOSCOPY (EGD) WITH PROPOFOL N/A 08/19/2022   Procedure: ESOPHAGOGASTRODUODENOSCOPY (EGD) WITH PROPOFOL;  Surgeon: Lanelle Bal, DO;  [facility];  Service: Endoscopy;  Laterality: N/A;   OVARIAN CYST DRAINAGE     POLYPECTOMY  08/19/2022   Procedure: POLYPECTOMY;  Surgeon: Lanelle Bal, DO;  [facility];  Service: Endoscopy;;   TUBAL LIGATION     Social History   Tobacco Use   Smoking status: Never    Passive exposure: Never   Smokeless tobacco: Never  Vaping Use   Vaping status: Never Used  Substance Use Topics   Alcohol use: No   Drug use: No   Family History  Problem Relation Age of Onset   High Cholesterol Mother    Asthma Mother    High Cholesterol Father    Heart disease Father    Colon cancer Father 36   Colon cancer Sister    Breast cancer Sister    Cancer Maternal Uncle  Lung cancer Maternal Uncle    COPD Maternal Grandfather    Allergies  Allergen Reactions   Sulfa Antibiotics Nausea And Vomiting and Other (See Comments)    REACTION: Family members "almost died" taking this medication   Penicillins Hives   Review of Systems  Musculoskeletal:  Positive for back pain (Left lumbar with sciatica).  All other systems reviewed and are negative.    Objective:     BP (!) 99/57   Pulse 73   Resp 16   Ht 5\' 6"  (1.676 m)   LMP 08/24/2019   SpO2 98%   BMI 32.29 kg/m  BP Readings from Last 3 Encounters:  05/23/23 (!) 99/57  05/19/23 109/76  05/16/23 (!) 103/57   Physical Exam Vitals reviewed.  Constitutional:      General: She is not in acute distress.    Appearance: Normal  appearance. She is not toxic-appearing.  HENT:     Head: Normocephalic and atraumatic.     Right Ear: External ear normal.     Left Ear: External ear normal.     Nose: Nose normal. No congestion or rhinorrhea.     Mouth/Throat:     Mouth: Mucous membranes are moist.     Pharynx: Oropharynx is clear. No oropharyngeal exudate or posterior oropharyngeal erythema.  Eyes:     General: No scleral icterus.    Extraocular Movements: Extraocular movements intact.     Conjunctiva/sclera: Conjunctivae normal.     Pupils: Pupils are equal, round, and reactive to light.  Cardiovascular:     Rate and Rhythm: Normal rate and regular rhythm.     Pulses: Normal pulses.     Heart sounds: Normal heart sounds. No murmur heard.    No friction rub. No gallop.  Pulmonary:     Effort: Pulmonary effort is normal.     Breath sounds: Normal breath sounds. No wheezing, rhonchi or rales.  Abdominal:     General: Abdomen is flat. Bowel sounds are normal. There is no distension.     Palpations: Abdomen is soft.     Tenderness: There is no abdominal tenderness.  Musculoskeletal:        General: Tenderness (Paraspinal muscles of the left lumbar spine) present. No swelling.     Cervical back: Normal range of motion.     Right lower leg: No edema.     Left lower leg: No edema.     Comments: Flexion at the waist is intact.  Limited extension and lateral movements to the left secondary to pain.  Lymphadenopathy:     Cervical: No cervical adenopathy.  Skin:    General: Skin is warm and dry.     Capillary Refill: Capillary refill takes less than 2 seconds.     Coloration: Skin is not jaundiced.  Neurological:     General: No focal deficit present.     Mental Status: She is alert and oriented to person, place, and time.  Psychiatric:        Mood and Affect: Mood normal.        Behavior: Behavior normal.    Diabetic foot exam was performed.  No deformities or other abnormal visual findings.  Posterior  tibialis and dorsalis pulse intact bilaterally.  Intact to touch and monofilament testing bilaterally.   Last CBC Lab Results  Component Value Date   WBC 7.3 05/15/2023   HGB 12.9 05/15/2023   HCT 40.4 05/15/2023   MCV 85.6 05/15/2023   MCH 27.3 05/15/2023   RDW  13.7 05/15/2023   PLT 283 05/15/2023   Last metabolic panel Lab Results  Component Value Date   GLUCOSE 101 (H) 05/15/2023   NA 139 05/15/2023   K 3.2 (L) 05/15/2023   CL 104 05/15/2023   CO2 24 05/15/2023   BUN 21 (H) 05/15/2023   CREATININE 0.69 05/15/2023   GFRNONAA >60 05/15/2023   CALCIUM 9.1 05/15/2023   PROT 7.4 05/15/2023   ALBUMIN 4.0 05/15/2023   LABGLOB 3.0 06/04/2022   AGRATIO 1.5 06/04/2022   BILITOT 0.4 05/15/2023   ALKPHOS 108 05/15/2023   AST 15 05/15/2023   ALT 16 05/15/2023   ANIONGAP 11 05/15/2023   Last lipids Lab Results  Component Value Date   CHOL 186 06/04/2022   HDL 35 (L) 06/04/2022   LDLCALC 123 (H) 06/04/2022   TRIG 157 (H) 06/04/2022   CHOLHDL 5.3 (H) 06/04/2022   Last hemoglobin A1c Lab Results  Component Value Date   HGBA1C 6.5 (H) 01/01/2023   Last thyroid functions Lab Results  Component Value Date   TSH 3.860 06/04/2022   Last vitamin D Lab Results  Component Value Date   VD25OH 28.1 (L) 01/01/2023   Last vitamin B12 and Folate Lab Results  Component Value Date   VITAMINB12 335 06/04/2022   FOLATE 9.5 06/04/2022   The 10-year ASCVD risk score (Arnett DK, et al., 2019) is: 3.5%    Assessment & Plan:   Problem List Items Addressed This Visit       Type 2 diabetes mellitus without complications (HCC)    New diagnosis from earlier this year.  Greggory Keen was prescribed but not covered by insurance.  She has focused on dietary changes aimed at weight loss and has lost 27 pounds since her last appointment.  She was congratulated on her progress. -Diabetic foot exam completed today -Repeat A1c at follow-up      Lumbar back pain with radiculopathy affecting  lower extremity - Primary    Her acute concern today remains lumbar back pain with left-sided sciatica.  Range of motion at the waist is limited secondary to pain.  Pain has not improved with as needed use of Flexeril.  She is also using Norco as needed for severe pain relief. -Add meloxicam 7.5 mg daily -Given patient's concern about persistent symptoms, history of severe facet arthropathy noted on prior imaging, she has been referred to neurosurgery.  She is scheduled to return to work next week but does not feel she will be able to perform duties.  Will provide note recommending that she abstain from work until Monday 11/18      Need for influenza vaccination    Influenza vaccine administered today      Return in about 3 months (around 08/23/2023).   Billie Lade, MD

## 2023-05-23 NOTE — Patient Instructions (Signed)
It was a pleasure to see you today.  Thank you for giving [place] the opportunity to be involved in your care.  Below is a brief recap of your visit and next steps.  We will plan to see you again in 3 months.  Summary Neurosurgery referral placed Meloxicam added for pain relief Follow up in 3 months

## 2023-05-30 DIAGNOSIS — K529 Noninfective gastroenteritis and colitis, unspecified: Secondary | ICD-10-CM | POA: Diagnosis not present

## 2023-06-02 ENCOUNTER — Telehealth: Payer: Self-pay

## 2023-06-02 NOTE — Telephone Encounter (Signed)
 Transition Care Management Unsuccessful Follow-up Telephone Call  Date of discharge and from where:  [facility]  Attempts:  1st Attempt  Reason for unsuccessful TCM follow-up call:  No answer/busy   Lenard Forth [place]  [facility], [facility] Guide, Phone: [phone] Website: [email or web address]

## 2023-06-02 NOTE — Therapy (Deleted)
OUTPATIENT PHYSICAL THERAPY THORACOLUMBAR EVALUATION   Patient Name: Erin Murray MRN: [id number] DOB:07/20/1970, 52 y.o., female Today's Date: 06/02/2023  END OF SESSION:   Past Medical History:  Diagnosis Date   Complicated migraine    includes: visual changes, left sided weakness, confusion, weakness, dizziness   Dysrhythmia    Fibromyalgia    Fibromyalgia    GERD (gastroesophageal reflux disease)    Headache(784.0)    Hypertension    Neurological disorder    unnamed neurological disorder   Reflux    Seizures (HCC)    Tachycardia    TIA (transient ischemic attack) 07/15/2008   Past Surgical History:  Procedure Laterality Date   BIOPSY  08/19/2022   Procedure: BIOPSY;  Surgeon: Erin Bal, DO;  [facility];  Service: Endoscopy;;   CHOLECYSTECTOMY     COLONOSCOPY     2015 per patient. done in [place], [place].   COLONOSCOPY WITH PROPOFOL N/A 08/19/2022   Procedure: COLONOSCOPY WITH PROPOFOL;  Surgeon: Erin Bal, DO;  [facility];  Service: Endoscopy;  Laterality: N/A;  10:15am, asa 3   ESOPHAGOGASTRODUODENOSCOPY N/A 10/14/2014   Procedure: ESOPHAGOGASTRODUODENOSCOPY (EGD);  Surgeon: Erin Hippo, MD;  [facility];  Service: Endoscopy;  Laterality: N/A;  210 - moved to 12:45 - Ann to notify pt   ESOPHAGOGASTRODUODENOSCOPY (EGD) WITH PROPOFOL N/A 08/19/2022   Procedure: ESOPHAGOGASTRODUODENOSCOPY (EGD) WITH PROPOFOL;  Surgeon: Erin Bal, DO;  [facility];  Service: Endoscopy;  Laterality: N/A;   OVARIAN CYST DRAINAGE     POLYPECTOMY  08/19/2022   Procedure: POLYPECTOMY;  Surgeon: Erin Bal, DO;  [facility];  Service: Endoscopy;;   TUBAL LIGATION     Patient Active Problem List   Diagnosis Date Noted   Need for influenza vaccination 05/23/2023   Intermittent diarrhea 04/01/2023   Type 2 diabetes mellitus without complications (HCC) 01/29/2023   Hyperlipidemia associated with type 2  diabetes mellitus (HCC) 01/29/2023   Closed nondisplaced fracture of lateral malleolus of right fibula 01/01/2023   Prediabetes 01/01/2023   Vitamin D insufficiency 01/01/2023   Obesity (BMI 30-39.9) 01/01/2023   Fibromyalgia 10/15/2022   Iron deficiency anemia due to chronic blood loss 10/15/2022   Arthritis of right acromioclavicular joint 10/15/2022   Adhesive capsulitis of shoulder 10/15/2022   Cyst of right ovary 10/15/2022   Enlarged uterus 10/15/2022   Hirsutism 10/15/2022   Traumatic arthropathy of the shoulder region 10/15/2022   Lumbar back pain with radiculopathy affecting lower extremity 09/23/2022   History of recent fall 06/26/2022   Tachycardia 06/04/2022   Bilateral leg paresthesia 06/04/2022   Encounter for general adult medical examination with abnormal findings 06/04/2022   Left upper quadrant abdominal pain 06/04/2022   Sprain of wrist joint, left, subsequent encounter 10/11/2021   Hand trauma, left, subsequent encounter 10/11/2021   Acid reflux 12/27/2020   Insomnia 12/27/2020   Peripheral nerve disease 12/27/2020   Hypertension 02/06/2019   Abnormal uterine bleeding (AUB) 04/01/2018   Reflux    Headache    TIA (transient ischemic attack)    Seizures (HCC)    Migraine     PCP: Erin Lade, MD  REFERRING PROVIDER: Rica Records, FNP  REFERRING DIAG: M54.40 (ICD-10-CM) - Back pain of lumbar region with sciatica  Rationale for Evaluation and Treatment: Rehabilitation  THERAPY DIAG:  No diagnosis found.  ONSET DATE: 10/27-28/24  SUBJECTIVE:  SUBJECTIVE STATEMENT: Arrives to the clinic with c/o low back pain that shoots to the L leg (see below). Patient states that the L LE  can get weak/numb at times. Condition started around 10/27-28/24 when she tried  to pick up an infant from the floor at work. Patient heard a popping sound when she picked up an infant. Patient thought it was her hip. Patient continued with her day until the pain got worse on 05/15/23. Pain was so bad that she took herself to the ER. Patient was told it was her sciatic nerve that's causing her pain. Patient was given toradol and hydrocodone which did not seem to help. Patient went to her PCP and was given the same meds. Patient was then referred to outpatient PT evaluation and management. Patient is currently off  from work  PERTINENT HISTORY:  "[name]" neurological disorder for years, hx of R ankle fx in 12/2022, hx of stroke  PAIN:  Are you having pain? Yes: NPRS scale: 4/10 Pain location: low back that radiates to the L LE Pain description: burning, aching, constant Aggravating factors: movement of any sort Relieving factors: being still  PRECAUTIONS: None  RED FLAGS: Bowel or bladder incontinence: No, Cauda equina syndrome: No, and Compression fracture: No   WEIGHT BEARING RESTRICTIONS: No  FALLS:  Has patient fallen in last 6 months? Yes. Number of falls 1  LIVING ENVIRONMENT: Lives with:  house and son Lives in: House/apartment Stairs: Yes: Internal: 12 steps; on right going up and External: 1 steps; none Has following equipment at home:  tripod cane, standard walker  OCCUPATION: teacher in a daycare (8 hours/day, 5 days/week)  PLOF: Independent and Independent with basic ADLs  PATIENT GOALS: "range of motion and be back to work"  NEXT MD VISIT: unknown  OBJECTIVE:  Note: Objective measures were completed at Evaluation unless otherwise noted.  DIAGNOSTIC FINDINGS:  02/06/23 MRI LUMBAR SPINE WITHOUT CONTRAST   TECHNIQUE: Multiplanar, multisequence MR imaging of the lumbar spine was performed. No intravenous contrast was administered.   COMPARISON:  None Available.   FINDINGS: Segmentation:  Standard.   Alignment:  Normal.   Vertebrae:  No  fracture, evidence of discitis, or bone lesion.   Conus medullaris and cauda equina: Conus extends to the L1 level. Conus and cauda equina appear normal.   Paraspinal and other soft tissues: Negative.   Disc levels:   T11-12 is imaged in the sagittal plane only and negative.   T12-L1: Negative.   L1-2: Negative.   L2-3: Negative.   L3-4: Negative.   L4-5: Mild to moderate facet degenerative change with some ligamentum flavum thickening and a shallow disc bulge. There is mild narrowing in the subarticular recesses. The foramina are widely patent.   L5-S1: Minimal disc bulge without stenosis.   IMPRESSION: 1. Mild narrowing in the subarticular recesses at L4-5 due to facet and ligamentum flavum hypertrophy and a shallow disc bulge. The foramina are widely patent. No nerve root compression is identified. 2. Minimal disc bulge L5-S1 without stenosis.  05/15/23 CT ABDOMEN AND PELVIS WITHOUT CONTRAST   CT Lumbar Spine without contrast   TECHNIQUE: Technique: Multiplanar CT images of the lumbar spine were reconstructed from contemporary CT of the Abdomen and Pelvis.   Multidetector CT imaging of the abdomen and pelvis was performed following the standard protocol without IV contrast.   RADIATION DOSE REDUCTION: This exam was performed according to the departmental dose-optimization program which includes automated exposure control, adjustment of the mA and/or kV according  to patient size and/or use of iterative reconstruction technique.   COMPARISON:  MRI lumbar spine 02/06/2023; lumbar spine radiographs 10/17/2022; CT abdomen and pelvis 02/17/2022   FINDINGS: ABDOMEN/PELVIS:   Lower chest: No acute abnormality.   Hepatobiliary: Cholecystectomy. Unremarkable noncontrast appearance of liver and biliary tree.   Pancreas: Unremarkable.   Spleen: Unremarkable.   Adrenals/Urinary Tract: Stable adrenal glands. No urinary calculi or hydronephrosis. Unremarkable  bladder.   Stomach/Bowel: No bowel obstruction or bowel wall thickening. Stomach is within normal limits. The appendix is normal.   Vascular/Lymphatic: No significant vascular findings are present. No enlarged abdominal or pelvic lymph nodes.   Reproductive: Uterus and bilateral adnexa are unremarkable. Right tubal ligation clips.   Other: No free intraperitoneal fluid or air.   Musculoskeletal: No acute fracture in the pelvis.   LUMBAR SPINE:   Segmentation: 5 lumbar type vertebrae.   Alignment: Normal.   Vertebrae: No acute fracture.   Paraspinal and other soft tissues: See above.   Disc levels: Intervertebral disc space height is maintained. No significant spinal canal or neural foraminal narrowing. Unchanged severe facet arthropathy at L4-L5.   IMPRESSION: 1. No acute abnormality in the abdomen or pelvis. 2. No acute fracture or traumatic malalignment of the lumbar spine  PATIENT SURVEYS:  FOTO 48.44  SCREENING FOR RED FLAGS: Bowel or bladder incontinence: No Cauda equina syndrome: No Compression fracture: No  COGNITION: Overall cognitive status: Within functional limits for tasks assessed     SENSATION: Light touch: WFL on B LE  MUSCLE LENGTH: Hamstrings: moderate restriction on B Thomas test: mild restriction on B Piriformis test: moderate restriction on B  POSTURE: rounded shoulders, forward head, and increased lumbar lordosis  PALPATION: Grade 1 tenderness and moderate muscle spasm on B paralumbars and glutes  LUMBAR ROM:   AROM eval  Flexion 75%  Extension 100%  Right lateral flexion   Left lateral flexion   Right rotation   Left rotation    (Blank rows = not tested) *repeated flexion/ext worsened the pain on the low back particularly ext  LOWER EXTREMITY ROM:     Active  Right eval Left eval  Hip flexion Around 25% limited due to weakness Around 25% limited due to weakness  Hip extension [facility]  Hip abduction [facility]  Hip  adduction    Hip internal rotation ~90% limited ~90% limited  Hip external rotation    Knee flexion [facility] WFL  Knee extension [facility]  Ankle dorsiflexion [facility] WFL  Ankle plantarflexion [facility] WFL  Ankle inversion    Ankle eversion     (Blank rows = not tested)  LOWER EXTREMITY MMT:    MMT Right eval Left eval  Hip flexion 3- 3-  Hip extension 4 3+  Hip abduction 3+ 3+  Hip adduction    Hip internal rotation    Hip external rotation    Knee flexion 4- 4-  Knee extension 4- 4-  Ankle dorsiflexion 4+ 4+  Ankle plantarflexion 4+ 4+  Ankle inversion    Ankle eversion     (Blank rows = not tested)  LUMBAR SPECIAL TESTS:  Straight leg raise test: Positive, Slump test: Positive, and FABER test: Positive  FUNCTIONAL TESTS:  5 times sit to stand: 15.68 sec 2 minute walk test: 330 ft  GAIT: Distance walked: 330 ft Assistive device utilized: None Level of assistance: Complete Independence Comments: slow cadence, decreased swing phase on B  TODAY'S TREATMENT:  DATE:  05/22/23 Evaluation and patient education done    PATIENT EDUCATION:  Education details: Educated on the pathoanatomy of low back pain. Educated on the goals and course of rehab.  Person educated: Patient Education method: Explanation Education comprehension: verbalized understanding  HOME EXERCISE PROGRAM: None provided to date  ASSESSMENT:  CLINICAL IMPRESSION: Patient is a 52 y.o. female who was seen today for physical therapy evaluation and treatment for back pain of the lumbar region with sciatica. Patient's condition is further defined by difficulty with bending over and walking due to pain, weakness, and decreased soft tissue extensibility. Skilled PT is required to address the impairments and functional limitations listed below. In addition, patient is (+) for FABER test on B, which  could imply possible hip pathology. Imaging may be necessary to further investigate structural deficits which could alter patient's function. Messaged provider on Epic Secure chat to request for a possibility of any imaging done to the hip.   OBJECTIVE IMPAIRMENTS: Abnormal gait, decreased activity tolerance, decreased balance, decreased mobility, difficulty walking, decreased ROM, decreased strength, impaired flexibility, and pain.   ACTIVITY LIMITATIONS: carrying, lifting, bending, sitting, standing, squatting, stairs, and transfers  PARTICIPATION LIMITATIONS: meal prep, cleaning, laundry, driving, shopping, community activity, occupation, and yard work  PERSONAL FACTORS: 3+ comorbidities: "[name]" neurological disorder for years, hx of R ankle fx in 12/2022, hx of stroke  are also affecting patient's functional outcome.   REHAB POTENTIAL: Fair    CLINICAL DECISION MAKING: Evolving/moderate complexity  EVALUATION COMPLEXITY: Moderate   GOALS: Goals reviewed with patient? Yes  SHORT TERM GOALS: Target date: 06/05/23  Pt will demonstrate indep in HEP to facilitate carry-over of skilled services and improve functional outcomes Goal status: INITIAL  LONG TERM GOALS: Target date: 06/19/23  Pt will increase FOTO to at least 68 in order to demonstrate significant improvement in function related to   Baseline: 48.44 Goal status: INITIAL  2.  Pt will increase by at least 40 ft in order to demonstrate clinically significant improvement in community ambulation Baseline: 330 ft Goal status: INITIAL  3.  Pt will decrease 5TSTS by at least 3 seconds in order to demonstrate clinically significant improvement in LE strength  Baseline: 15.68 sec Goal status: INITIAL  4.  Pt will demonstrate increase in LE strength to 4 to facilitate ease and safety in ambulation Baseline: 3-/5 Goal status: INITIAL  PLAN:  PT FREQUENCY: 2x/week  PT DURATION: 4 weeks  PLANNED INTERVENTIONS:  97164- PT Re-evaluation, 97110-Therapeutic exercises, 97530- Therapeutic activity, 97112- Neuromuscular re-education, 97535- Self Care, [id number]- Manual therapy, [id number]- Gait training, 97014- Electrical stimulation (unattended), Patient/Family education, Cryotherapy, and Moist heat.  PLAN FOR NEXT SESSION: Provide HEP. Begin core and hip strengthening as well as mobility activities.  Tish Frederickson. Adivino, PT, DPT, OCS Board-Certified Clinical Specialist in Orthopedic PT PT Compact Privilege # ([place]): [id number] T  06/02/2023, 5:09 PM

## 2023-06-02 NOTE — Telephone Encounter (Signed)
Transition Care Management Unsuccessful Follow-up Telephone Call  Date of discharge and from where:  [facility] 11/1 Attempts:  2nd Attempt  Reason for unsuccessful TCM follow-up call:  No answer/busy   [name]  [facility], [facility] Guide, Phone: [phone] Website: [email or web address]

## 2023-06-04 ENCOUNTER — Ambulatory Visit: Payer: 59

## 2023-06-04 ENCOUNTER — Other Ambulatory Visit: Payer: Self-pay | Admitting: Internal Medicine

## 2023-06-04 DIAGNOSIS — R1012 Left upper quadrant pain: Secondary | ICD-10-CM

## 2023-06-05 ENCOUNTER — Ambulatory Visit: Payer: Self-pay | Admitting: Internal Medicine

## 2023-06-07 ENCOUNTER — Ambulatory Visit: Payer: 59

## 2023-06-09 ENCOUNTER — Ambulatory Visit: Payer: 59

## 2023-06-11 ENCOUNTER — Ambulatory Visit: Payer: 59

## 2023-06-13 ENCOUNTER — Other Ambulatory Visit: Payer: Self-pay | Admitting: Internal Medicine

## 2023-06-13 DIAGNOSIS — R000 Tachycardia, unspecified: Secondary | ICD-10-CM

## 2023-06-13 DIAGNOSIS — G43809 Other migraine, not intractable, without status migrainosus: Secondary | ICD-10-CM

## 2023-06-16 ENCOUNTER — Inpatient Hospital Stay
Admission: RE | Admit: 2023-06-16 | Discharge: 2023-06-16 | Disposition: A | Discharge status: Home / Self Care | Payer: Self-pay | Source: Ambulatory Visit | Attending: Orthopedic Surgery | Admitting: Orthopedic Surgery

## 2023-06-16 ENCOUNTER — Other Ambulatory Visit: Payer: Self-pay

## 2023-06-16 DIAGNOSIS — Z049 Encounter for examination and observation for unspecified reason: Secondary | ICD-10-CM

## 2023-06-17 NOTE — Progress Notes (Unsigned)
Referring Physician:  Billie Lade, MD [address] [place],  [place] [address]  Primary Physician:  Billie Lade, MD  History of Present Illness: 06/18/2023 Ms. Erin Murray has a history of TIA, migraines, HTN, DM, hyperlipidemia, FM, seizures, and obesity.   Seen in ED on 05/15/23 with LBP and left leg pain that started a few days prior. Discharged on prednisone and norco 5.   Seen by PCP office on 05/19/23 and norco 5 refilled. Was sent to PT. Saw PT for initial eval on 05/22/23.   Followed up with PCP on 05/23/23. She was to take prn flexeril, prn norco, and mobic 7.5mg  daily. Was written out  of work until 06/02/23.   She has constant LBP with constant left lateral leg pain to her foot and occasional right lateral leg pain to her foot x 2 months. 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 LBP > leg pain, left leg pain > right leg pain. She has numbness and tingling in her legs- this is chronic, but it is worse. She notes some weakness in her left leg.   She is taking flexeril, norco 5, and mobic- these are not helping.   Bowel/Bladder Dysfunction: none, she has diarrhea that started prior to pain. No incontinence.   Conservative measures:  Physical therapy: Saw PT for initial eval on 05/22/23 Multimodal medical therapy including regular antiinflammatories: prednisone, norco, flexeril, mobic  Injections: No epidural steroid injections  Past Surgery: no spinal surgery  Erin Murray has no symptoms of cervical myelopathy.  The symptoms are causing a significant impact on the patient's life.   Review of Systems:  A 10 point review of systems is negative, except for the pertinent positives and negatives detailed in the HPI.  Past Medical History: Past Medical History:  Diagnosis Date   Complicated migraine    includes: visual changes, left sided weakness, confusion, weakness, dizziness   Dysrhythmia    Fibromyalgia     Fibromyalgia    GERD (gastroesophageal reflux disease)    Headache(784.0)    Hypertension    Neurological disorder    unnamed neurological disorder   Reflux    Seizures (HCC)    Tachycardia    TIA (transient ischemic attack) 07/15/2008    Past Surgical History: Past Surgical History:  Procedure Laterality Date   BIOPSY  08/19/2022   Procedure: BIOPSY;  Surgeon: Lanelle Bal, DO;  [facility]: [facility];  Service: Endoscopy;;   CHOLECYSTECTOMY     COLONOSCOPY     2015 per patient. done in [place], [place].   COLONOSCOPY WITH PROPOFOL N/A 08/19/2022   Procedure: COLONOSCOPY WITH PROPOFOL;  Surgeon: Lanelle Bal, DO;  [facility]: [facility];  Service: Endoscopy;  Laterality: N/A;  10:15am, asa 3   ESOPHAGOGASTRODUODENOSCOPY N/A 10/14/2014   Procedure: ESOPHAGOGASTRODUODENOSCOPY (EGD);  Surgeon: Malissa Hippo, MD;  Location: [facility];  Service: Endoscopy;  Laterality: N/A;  210 - moved to 12:45 - Ann to notify pt   ESOPHAGOGASTRODUODENOSCOPY (EGD) WITH PROPOFOL N/A 08/19/2022   Procedure: ESOPHAGOGASTRODUODENOSCOPY (EGD) WITH PROPOFOL;  Surgeon: Lanelle Bal, DO;  [facility]: [facility];  Service: Endoscopy;  Laterality: N/A;   OVARIAN CYST DRAINAGE     POLYPECTOMY  08/19/2022   Procedure: POLYPECTOMY;  Surgeon: Lanelle Bal, DO;  Location: [facility];  Service: Endoscopy;;   TUBAL LIGATION      Allergies: Allergies as of 06/18/2023 - Review Complete  05/23/2023  Allergen Reaction Noted   Sulfa antibiotics Nausea And Vomiting and Other (See Comments) 02/20/2012   Penicillins Hives 02/20/2012    Medications: Outpatient Encounter Medications as of 06/18/2023  Medication Sig   aspirin EC 81 MG tablet Take 81 mg by mouth daily.   atorvastatin (LIPITOR) 20 MG tablet Take 1 tablet (20 mg total) by mouth daily.   cyclobenzaprine (FLEXERIL) 10 MG tablet Take 1 tablet (10 mg total) by mouth 3 (three) times daily as needed for muscle spasms. Do not drink alcohol or drive  while taking this medication.  May cause drowsiness.   fluticasone (FLONASE) 50 MCG/ACT nasal spray Place 1 spray into both nostrils 2 (two) times daily.   HYDROcodone-acetaminophen (NORCO) 5-325 MG tablet Take 1 tablet by mouth every 6 (six) hours as needed.   lisinopril (ZESTRIL) 5 MG tablet Take 1 tablet (5 mg total) by mouth daily.   melatonin 5 MG TABS Take 20 mg by mouth at bedtime as needed.   meloxicam (MOBIC) 7.5 MG tablet Take 1 tablet (7.5 mg total) by mouth daily.   metoprolol tartrate (LOPRESSOR) 50 MG tablet Take 1 tablet by mouth twice daily   ondansetron (ZOFRAN) 4 MG tablet Take 1 tablet (4 mg total) by mouth every 8 (eight) hours as needed for nausea or vomiting.   pantoprazole (PROTONIX) 40 MG tablet Take 1 tablet by mouth twice daily   predniSONE (DELTASONE) 10 MG tablet Take 2 tablets (20 mg total) by mouth 2 (two) times daily.   rizatriptan (MAXALT-MLT) 10 MG disintegrating tablet Take 1 tablet (10 mg total) by mouth as needed for migraine. May repeat in 2 hours if needed   topiramate (TOPAMAX) 50 MG tablet TAKE 1 TABLET BY MOUTH TWICE DAILY FOR 7 DAYS THEN  INCREASE  TO  2  TABLETS  BY  MOUTH  TWICE  DAILY   vitamin B-12 (CYANOCOBALAMIN) 1000 MCG tablet Take 5,000 mcg by mouth daily.   No facility-administered encounter medications on file as of 06/18/2023.    Social History: Social History   Tobacco Use   Smoking status: Never    Passive exposure: Never   Smokeless tobacco: Never  Vaping Use   Vaping status: Never Used  Substance Use Topics   Alcohol use: No   Drug use: No    Family Medical History: Family History  Problem Relation Age of Onset   High Cholesterol Mother    Asthma Mother    High Cholesterol Father    Heart disease Father    Colon cancer Father 60   Colon cancer Sister    Breast cancer Sister    Cancer Maternal Uncle    Lung cancer Maternal Uncle    COPD Maternal Grandfather     Physical Examination: There were no vitals filed for  this visit.  General: Patient is well developed, well nourished, calm, collected, and in no apparent distress. Attention to examination is appropriate.  Respiratory: Patient is breathing without any difficulty.   NEUROLOGICAL:     Awake, alert, oriented to person, place, and time.  Speech is clear and fluent. Fund of knowledge is appropriate.   Cranial Nerves: Pupils equal round and reactive to light.  Facial tone is symmetric.    She has limited ROM of lumbar spine with pain on flexion and extension She has lower posterior lumbar tenderness.   No abnormal lesions on exposed skin.   Strength: Side Biceps Triceps Deltoid Interossei Grip Wrist Ext. Wrist Flex.  R 5 5  5 5 5 5 5   L 5 5 5 5 5 5 5    Side Iliopsoas Quads Hamstring PF DF EHL  R 5 5 5 5 5 5   L 5 5 5 5 5 5    No gross weakness, but does not give great effort with strength testing.   Reflexes are 2+ and symmetric at the biceps, brachioradialis, patella and achilles.   Hoffman's is absent.  Clonus is not present.   Bilateral upper and lower extremity sensation is intact to light touch.     Gait is slow.   Medical Decision Making  Imaging: CT of lumbar spine dated 05/15/23:  Segmentation: 5 lumbar type vertebrae.   Alignment: Normal.   Vertebrae: No acute fracture.   Paraspinal and other soft tissues: See above.   Disc levels: Intervertebral disc space height is maintained. No significant spinal canal or neural foraminal narrowing. Unchanged severe facet arthropathy at L4-L5.   IMPRESSION: No acute fracture or traumatic malalignment of the lumbar spine.     Electronically Signed   By: Minerva Fester M.D.   On: 05/16/2023 01:13   MRI of lumbar spine dated 02/06/23:  FINDINGS: Segmentation:  Standard.   Alignment:  Normal.   Vertebrae:  No fracture, evidence of discitis, or bone lesion.   Conus medullaris and cauda equina: Conus extends to the L1 level. Conus and cauda equina appear normal.    Paraspinal and other soft tissues: Negative.   Disc levels:   T11-12 is imaged in the sagittal plane only and negative.   T12-L1: Negative.   L1-2: Negative.   L2-3: Negative.   L3-4: Negative.   L4-5: Mild to moderate facet degenerative change with some ligamentum flavum thickening and a shallow disc bulge. There is mild narrowing in the subarticular recesses. The foramina are widely patent.   L5-S1: Minimal disc bulge without stenosis.   IMPRESSION: 1. Mild narrowing in the subarticular recesses at L4-5 due to facet and ligamentum flavum hypertrophy and a shallow disc bulge. The foramina are widely patent. No nerve root compression is identified. 2. Minimal disc bulge L5-S1 without stenosis.     Electronically Signed   By: Drusilla Murray M.D.   On: 02/17/2023 10:10    I have personally reviewed the images and agree with the above interpretation.  Assessment and Plan: Ms. Boesen is a pleasant 52 y.o. female with a 6-8 week history of constant LBP with constant left lateral leg pain to her foot and occasional right lateral leg pain to her foot. 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 LBP > leg pain, left leg pain > right leg pain.   She has known lumbar spondylosis with facet hypertrophy and disc bulge at L4-L5 along with mild lateral recess narrowing bilaterally.   Leg pain is likely due to underlying spondylosis. Leg pain is likely from lateral recess narrowing L4-L5.    Treatment options discussed with patient and following plan made:   - Recommend PT for lumbar spine. She has not been able to fit this in with work. Given lumbar HEP to do on her own. These were reviewed with her.  - Referral to pain management ([facility]) to discuss possible lumbar injections.  - She is unable to come out of work right now for financial reasons. Will let me know if this changes.  - Follow up with me for phone visit in 6-8 weeks (she lives in  [place]).   I spent a total of  30 minutes in face-to-face and non-face-to-face activities related to this patient's care today including review of outside records, review of imaging, review of symptoms, physical exam, discussion of differential diagnosis, discussion of treatment options, and documentation.   Thank you for involving me in the care of this patient.   Drake Leach PA-C Dept. of Neurosurgery

## 2023-06-18 ENCOUNTER — Ambulatory Visit: Payer: 59 | Admitting: Orthopedic Surgery

## 2023-06-18 ENCOUNTER — Encounter: Payer: Self-pay | Admitting: Orthopedic Surgery

## 2023-06-18 VITALS — BP 116/76 | Ht 66.0 in | Wt 200.6 lb

## 2023-06-18 DIAGNOSIS — M5126 Other intervertebral disc displacement, lumbar region: Secondary | ICD-10-CM | POA: Diagnosis not present

## 2023-06-18 DIAGNOSIS — M47816 Spondylosis without myelopathy or radiculopathy, lumbar region: Secondary | ICD-10-CM

## 2023-06-18 DIAGNOSIS — M4726 Other spondylosis with radiculopathy, lumbar region: Secondary | ICD-10-CM | POA: Diagnosis not present

## 2023-06-18 DIAGNOSIS — M5416 Radiculopathy, lumbar region: Secondary | ICD-10-CM

## 2023-06-18 NOTE — Patient Instructions (Signed)
It was so nice to see you today. Thank you so much for coming in.    You have some wear and tear in your back (arthritis) and I think this is causing your back pain and likely your leg pain.   I gave you some lumbar exercises to do. Try to do daily. If one hurts, I would skip that one.   I want you to see pain management here in [place] (Dr. Cherylann Ratel) to discuss possible lumbar injections. They should call you to schedule an appointment or you can call them at [phone].   Let me know if you need to come out of work.   We have a phone visit scheduled at the end of January. Please do not hesitate to call if you have any questions or concerns. You can also message me in MyChart.   Drake Leach PA-C [phone]     The physicians and staff at [facility] are committed to providing excellent care. You may receive a survey asking for feedback about your experience at our office. We value you your feedback and appreciate you taking the time to to fill it out. The [facility] leadership team is also available to discuss your experience in person, feel free to contact us [phone].

## 2023-07-01 ENCOUNTER — Other Ambulatory Visit: Payer: Self-pay | Admitting: Internal Medicine

## 2023-07-01 DIAGNOSIS — R1012 Left upper quadrant pain: Secondary | ICD-10-CM

## 2023-07-18 ENCOUNTER — Other Ambulatory Visit: Payer: Self-pay | Admitting: Internal Medicine

## 2023-07-18 DIAGNOSIS — R000 Tachycardia, unspecified: Secondary | ICD-10-CM

## 2023-07-22 ENCOUNTER — Ambulatory Visit
Payer: No Typology Code available for payment source | Attending: Student in an Organized Health Care Education/Training Program | Admitting: Student in an Organized Health Care Education/Training Program

## 2023-07-22 ENCOUNTER — Encounter: Payer: Self-pay | Admitting: Student in an Organized Health Care Education/Training Program

## 2023-07-22 VITALS — BP 116/72 | HR 84 | Temp 97.2°F | Resp 16 | Ht 66.0 in | Wt 206.7 lb

## 2023-07-22 DIAGNOSIS — F419 Anxiety disorder, unspecified: Secondary | ICD-10-CM | POA: Diagnosis present

## 2023-07-22 DIAGNOSIS — M47816 Spondylosis without myelopathy or radiculopathy, lumbar region: Secondary | ICD-10-CM | POA: Insufficient documentation

## 2023-07-22 DIAGNOSIS — F32A Depression, unspecified: Secondary | ICD-10-CM | POA: Insufficient documentation

## 2023-07-22 DIAGNOSIS — G894 Chronic pain syndrome: Secondary | ICD-10-CM | POA: Diagnosis present

## 2023-07-22 DIAGNOSIS — M5416 Radiculopathy, lumbar region: Secondary | ICD-10-CM | POA: Insufficient documentation

## 2023-07-22 DIAGNOSIS — M797 Fibromyalgia: Secondary | ICD-10-CM | POA: Diagnosis present

## 2023-07-22 DIAGNOSIS — G8929 Other chronic pain: Secondary | ICD-10-CM | POA: Diagnosis present

## 2023-07-22 MED ORDER — BUPRENORPHINE 5 MCG/HR TD PTWK: 1.0000 | MEDICATED_PATCH | TRANSDERMAL | 0 refills | Status: AC

## 2023-07-22 NOTE — Patient Instructions (Addendum)
 GENERAL RISKS AND COMPLICATIONS  What are the risk, side effects and possible complications? Generally speaking, most procedures are safe.  However, with any procedure there are risks, side effects, and the possibility of complications.  The risks and complications are dependent upon the sites that are lesioned, or the type of nerve block to be performed.  The closer the procedure is to the spine, the more serious the risks are.  Great care is taken when placing the radio frequency needles, block needles or lesioning probes, but sometimes complications can occur. Infection: Any time there is an injection through the skin, there is a risk of infection.  This is why sterile conditions are used for these blocks.  There are four possible types of infection. Localized skin infection. Central Nervous System Infection-This can be in the form of Meningitis, which can be deadly. Epidural Infections-This can be in the form of an epidural abscess, which can cause pressure inside of the spine, causing compression of the spinal cord with subsequent paralysis. This would require an emergency surgery to decompress, and there are no guarantees that the patient would recover from the paralysis. Discitis-This is an infection of the intervertebral discs.  It occurs in about 1% of discography procedures.  It is difficult to treat and it may lead to surgery.        2. Pain: the needles have to go through skin and soft tissues, will cause soreness.       3. Damage to internal structures:  The nerves to be lesioned may be near blood vessels or    other nerves which can be potentially damaged.       4. Bleeding: Bleeding is more common if the patient is taking blood thinners such as  aspirin, Coumadin, Ticiid, Plavix, etc., or if he/she have some genetic predisposition  such as hemophilia. Bleeding into the spinal canal can cause compression of the spinal  cord with subsequent paralysis.  This would require an emergency  surgery to  decompress and there are no guarantees that the patient would recover from the  paralysis.       5. Pneumothorax:  Puncturing of a lung is a possibility, every time a needle is introduced in  the area of the chest or upper back.  Pneumothorax refers to free air around the  collapsed lung(s), inside of the thoracic cavity (chest cavity).  Another two possible  complications related to a similar event would include: Hemothorax and Chylothorax.   These are variations of the Pneumothorax, where instead of air around the collapsed  lung(s), you may have blood or chyle, respectively.       6. Spinal headaches: They may occur with any procedures in the area of the spine.       7. Persistent CSF (Cerebro-Spinal Fluid) leakage: This is a rare problem, but may occur  with prolonged intrathecal or epidural catheters either due to the formation of a fistulous  track or a dural tear.       8. Nerve damage: By working so close to the spinal cord, there is always a possibility of  nerve damage, which could be as serious as a permanent spinal cord injury with  paralysis.       9. Death:  Although rare, severe deadly allergic reactions known as "Anaphylactic  reaction" can occur to any of the medications used.      10. Worsening of the symptoms:  We can always make thing worse.  What are the chances  of something like this happening? Chances of any of this occuring are extremely low.  By statistics, you have more of a chance of getting killed in a motor vehicle accident: while driving to the hospital than any of the above occurring .  Nevertheless, you should be aware that they are possibilities.  In general, it is similar to taking a shower.  Everybody knows that you can slip, hit your head and get killed.  Does that mean that you should not shower again?  Nevertheless always keep in mind that statistics do not mean anything if you happen to be on the wrong side of them.  Even if a procedure has a 1 (one) in a  1,000,000 (million) chance of going wrong, it you happen to be that one..Also, keep in mind that by statistics, you have more of a chance of having something go wrong when taking medications.  Who should not have this procedure? If you are on a blood thinning medication (e.g. Coumadin, Plavix, see list of "Blood Thinners"), or if you have an active infection going on, you should not have the procedure.  If you are taking any blood thinners, please inform your physician.  How should I prepare for this procedure? Do not eat or drink anything at least six hours prior to the procedure. Bring a driver with you .  It cannot be a taxi. Come accompanied by an adult that can drive you back, and that is strong enough to help you if your legs get weak or numb from the local anesthetic. Take all of your medicines the morning of the procedure with just enough water to swallow them. If you have diabetes, make sure that you are scheduled to have your procedure done first thing in the morning, whenever possible. If you have diabetes, take only half of your insulin dose and notify our nurse that you have done so as soon as you arrive at the clinic. If you are diabetic, but only take blood sugar pills (oral hypoglycemic), then do not take them on the morning of your procedure.  You may take them after you have had the procedure. Do not take aspirin or any aspirin-containing medications, at least eleven (11) days prior to the procedure.  They may prolong bleeding. Wear loose fitting clothing that may be easy to take off and that you would not mind if it got stained with Betadine or blood. Do not wear any jewelry or perfume Remove any nail coloring.  It will interfere with some of our monitoring equipment.  NOTE: Remember that this is not meant to be interpreted as a complete list of all possible complications.  Unforeseen problems may occur.  BLOOD THINNERS The following drugs contain aspirin or other products,  which can cause increased bleeding during surgery and should not be taken for 2 weeks prior to and 1 week after surgery.  If you should need take something for relief of minor pain, you may take acetaminophen which is found in Tylenol,m Datril, Anacin-3 and Panadol. It is not blood thinner. The products listed below are.  Do not take any of the products listed below in addition to any listed on your instruction sheet.  A.P.C or A.P.C with Codeine Codeine Phosphate Capsules #3 Ibuprofen Ridaura  ABC compound Congesprin Imuran rimadil  Advil Cope Indocin Robaxisal  Alka-Seltzer Effervescent Pain Reliever and Antacid Coricidin or Coricidin-D  Indomethacin Rufen  Alka-Seltzer plus Cold Medicine Cosprin Ketoprofen S-A-C Tablets  Anacin Analgesic Tablets or Capsules Coumadin  Korlgesic Salflex  Anacin Extra Strength Analgesic tablets or capsules CP-2 Tablets Lanoril Salicylate  Anaprox Cuprimine Capsules Levenox Salocol  Anexsia-D Dalteparin Magan Salsalate  Anodynos Darvon compound Magnesium Salicylate Sine-off  Ansaid Dasin Capsules Magsal Sodium Salicylate  Anturane Depen Capsules Marnal Soma  APF Arthritis pain formula Dewitt's Pills Measurin Stanback  Argesic Dia-Gesic Meclofenamic Sulfinpyrazone  Arthritis Bayer Timed Release Aspirin Diclofenac Meclomen Sulindac  Arthritis pain formula Anacin Dicumarol Medipren Supac  Analgesic (Safety coated) Arthralgen Diffunasal Mefanamic Suprofen  Arthritis Strength Bufferin Dihydrocodeine Mepro Compound Suprol  Arthropan liquid Dopirydamole Methcarbomol with Aspirin Synalgos  ASA tablets/Enseals Disalcid Micrainin Tagament  Ascriptin Doan's Midol Talwin  Ascriptin A/D Dolene Mobidin Tanderil  Ascriptin Extra Strength Dolobid Moblgesic Ticlid  Ascriptin with Codeine Doloprin or Doloprin with Codeine Momentum Tolectin  Asperbuf Duoprin Mono-gesic Trendar  Aspergum Duradyne Motrin or Motrin IB Triminicin  Aspirin plain, buffered or enteric coated  Durasal Myochrisine Trigesic  Aspirin Suppositories Easprin Nalfon Trillsate  Aspirin with Codeine Ecotrin Regular or Extra Strength Naprosyn Uracel  Atromid-S Efficin Naproxen Ursinus  Auranofin Capsules Elmiron Neocylate Vanquish  Axotal Emagrin Norgesic Verin  Azathioprine Empirin or Empirin with Codeine Normiflo Vitamin E  Azolid Emprazil Nuprin Voltaren  Bayer Aspirin plain, buffered or children's or timed BC Tablets or powders Encaprin Orgaran Warfarin Sodium  Buff-a-Comp Enoxaparin Orudis Zorpin  Buff-a-Comp with Codeine Equegesic Os-Cal-Gesic   Buffaprin Excedrin plain, buffered or Extra Strength Oxalid   Bufferin Arthritis Strength Feldene Oxphenbutazone   Bufferin plain or Extra Strength Feldene Capsules Oxycodone with Aspirin   Bufferin with Codeine Fenoprofen Fenoprofen Pabalate or Pabalate-SF   Buffets II Flogesic Panagesic   Buffinol plain or Extra Strength Florinal or Florinal with Codeine Panwarfarin   Buf-Tabs Flurbiprofen Penicillamine   Butalbital Compound Four-way cold tablets Penicillin   Butazolidin Fragmin Pepto-Bismol   Carbenicillin Geminisyn Percodan   Carna Arthritis Reliever Geopen Persantine   Carprofen Gold's salt Persistin   Chloramphenicol Goody's Phenylbutazone   Chloromycetin Haltrain Piroxlcam   Clmetidine heparin Plaquenil   Cllnoril Hyco-pap Ponstel   Clofibrate Hydroxy chloroquine Propoxyphen         Before stopping any of these medications, be sure to consult the physician who ordered them.  Some, such as Coumadin (Warfarin) are ordered to prevent or treat serious conditions such as "deep thrombosis", "pumonary embolisms", and other heart problems.  The amount of time that you may need off of the medication may also vary with the medication and the reason for which you were taking it.  If you are taking any of these medications, please make sure you notify your pain physician before you undergo any procedures.         Epidural Steroid  Injection Patient Information  Description: The epidural space surrounds the nerves as they exit the spinal cord.  In some patients, the nerves can be compressed and inflamed by a bulging disc or a tight spinal canal (spinal stenosis).  By injecting steroids into the epidural space, we can bring irritated nerves into direct contact with a potentially helpful medication.  These steroids act directly on the irritated nerves and can reduce swelling and inflammation which often leads to decreased pain.  Epidural steroids may be injected anywhere along the spine and from the neck to the low back depending upon the location of your pain.   After numbing the skin with local anesthetic (like Novocaine), a small needle is passed into the epidural space slowly.  You may experience a sensation of pressure while this  is being done.  The entire block usually last less than 10 minutes.  Conditions which may be treated by epidural steroids:  Low back and leg pain Neck and arm pain Spinal stenosis Post-laminectomy syndrome Herpes zoster (shingles) pain Pain from compression fractures  Preparation for the injection:  Do not eat any solid food or dairy products within 8 hours of your appointment.  You may drink clear liquids up to 3 hours before appointment.  Clear liquids include water, black coffee, juice or soda.  No milk or cream please. You may take your regular medication, including pain medications, with a sip of water before your appointment  Diabetics should hold regular insulin (if taken separately) and take 1/2 normal NPH dos the morning of the procedure.  Carry some sugar containing items with you to your appointment. A driver must accompany you and be prepared to drive you home after your procedure.  Bring all your current medications with your. An IV may be inserted and sedation may be given at the discretion of the physician.   A blood pressure cuff, EKG and other monitors will often be applied  during the procedure.  Some patients may need to have extra oxygen administered for a short period. You will be asked to provide medical information, including your allergies, prior to the procedure.  We must know immediately if you are taking blood thinners (like Coumadin/Warfarin)  Or if you are allergic to IV iodine contrast (dye). We must know if you could possible be pregnant.  Possible side-effects: Bleeding from needle site Infection (rare, may require surgery) Nerve injury (rare) Numbness & tingling (temporary) Difficulty urinating (rare, temporary) Spinal headache ( a headache worse with upright posture) Light -headedness (temporary) Pain at injection site (several days) Decreased blood pressure (temporary) Weakness in arm/leg (temporary) Pressure sensation in back/neck (temporary)  Call if you experience: Fever/chills associated with headache or increased back/neck pain. Headache worsened by an upright position. New onset weakness or numbness of an extremity below the injection site Hives or difficulty breathing (go to the emergency room) Inflammation or drainage at the infection site Severe back/neck pain Any new symptoms which are concerning to you  Please note:  Although the local anesthetic injected can often make your back or neck feel good for several hours after the injection, the pain will likely return.  It takes 3-7 days for steroids to work in the epidural space.  You may not notice any pain relief for at least that one week.  If effective, we will often do a series of three injections spaced 3-6 weeks apart to maximally decrease your pain.  After the initial series, we generally will wait several months before considering a repeat injection of the same type.  If you have any questions, please call [phone] [facility]

## 2023-07-22 NOTE — Progress Notes (Signed)
 Safety precautions to be maintained throughout the outpatient stay will include: orient to surroundings, keep bed in low position, maintain call bell within reach at all times, provide assistance with transfer out of bed and ambulation.

## 2023-07-22 NOTE — Progress Notes (Signed)
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 color, temperature, and hair growth are WNL. No peripheral edema or cyanosis. No masses, redness, swelling, asymmetry, or associated skin lesions. No contractures.  Skin & Extremity Inspection: Skin color, temperature, and hair growth are WNL. No peripheral edema or cyanosis. No masses, redness, swelling, asymmetry, or associated skin lesions. No contractures.  Functional ROM: Unrestricted ROM                  Functional ROM: Unrestricted ROM                  Muscle Tone/Strength: Functionally intact. No obvious neuro-muscular anomalies detected.  Muscle Tone/Strength: Functionally intact. No obvious neuro-muscular anomalies  detected.  Sensory (Neurological): Unimpaired        Sensory (Neurological): Unimpaired        DTR: Patellar: deferred today Achilles: deferred today Plantar: deferred today  DTR: Patellar: deferred today Achilles: deferred today Plantar: deferred today  Palpation: No palpable anomalies  Palpation: No palpable anomalies    Assessment  Primary Diagnosis & Pertinent Problem List: The primary encounter diagnosis was Chronic radicular lumbar pain. Diagnoses of Lumbar radiculopathy, Lumbar facet arthropathy, Fibromyalgia,  Chronic pain syndrome, and Anxiety and depression were also pertinent to this visit.  Visit Diagnosis (New problems to examiner): 1. Chronic radicular lumbar pain   2. Lumbar radiculopathy   3. Lumbar facet arthropathy   4. Fibromyalgia   5. Chronic pain syndrome   6. Anxiety and depression    Plan of Care (Initial workup plan)      Low Back Pain with Sciatica, lumbar radicular pain They exhibit chronic low back pain radiating to the left knee, occasionally involving the right side, suggestive of sciatica. We will perform an L4-L5 epidural steroid injection under X-ray guidance. The procedure, potential benefits, and follow-up plan were explained.   Fibromyalgia They have had fibromyalgia for 16-17 years and are currently on gabapentin  and Flexeril , having discontinued Lyrica due to mood changes. We discussed starting a Butrans  patch at 5 mcg/hour, replaced every 7 days, for pain management, including its benefits and administration details. They will rotate application sites weekly to avoid skin irritation and apply Eucerin cream or Flonase  spray for any skin irritation. A baseline urine screen will be obtained.  Anxiety and Depression They have anxiety and depression and seek a psychiatric referral after discontinuing Valium  due to household concerns. We will refer them to Dr. Eapen for psychiatric evaluation and management and inquire about providers closer to  [place] or [place].  General Health Maintenance They will continue taking 81 mg aspirin daily.  Follow-up A follow-up visit is scheduled in 3-4 weeks.      1. Chronic radicular lumbar pain (Primary) - Lumbar Epidural Injection; Future  2. Lumbar radiculopathy - buprenorphine  (BUTRANS ) 5 MCG/HR PTWK; Place 1 patch onto the skin once a week for 28 days.  Dispense: 4 patch; Refill: 0 - Lumbar Epidural Injection; Future  3. Lumbar facet arthropathy  4. Fibromyalgia - buprenorphine  (BUTRANS ) 5 MCG/HR PTWK; Place 1 patch onto the skin once a week for 28 days.  Dispense: 4 patch; Refill: 0  5. Chronic pain syndrome - buprenorphine  (BUTRANS ) 5 MCG/HR PTWK; Place 1 patch onto the skin once a week for 28 days.  Dispense: 4 patch; Refill: 0 - Compliance Drug Analysis, Ur  6. Anxiety and depression - Ambulatory referral to Psychiatry     Lab Orders         Compliance Drug Analysis, Ur     Referral Orders         Ambulatory referral to Psychiatry     Procedure Orders         Lumbar Epidural Injection     Pharmacotherapy (current): Medications ordered:  Meds ordered this encounter  Medications   buprenorphine  (BUTRANS ) 5 MCG/HR PTWK    Sig: Place 1 patch onto the skin once a week for 28 days.    Dispense:  4 patch    Refill:  0    Chronic Pain: STOP Act (Not applicable) Fill 1 day early if closed on refill date. Avoid benzodiazepines within 8 hours of opioids   Medications administered during this visit: Dennisse B. Deronda Cornish had no medications administered during this visit.   Provider-requested follow-up: Return in about 20 days (around 08/11/2023) for Left L4-5 ESI, in clinic [facility].  Future Appointments  Date Time Provider Department Center  08/08/2023 11:30 AM Hilma Hastings, [place] [facility] None  09/02/2023  4:20 PM Melvenia Manus BRAVO, MD [facility]    Duration of encounter: .  Total time on encounter, as per AMA guidelines included both the face-to-face and  non-face-to-face time personally spent by the physician  and/or other qualified health care professional(s) on the day of the encounter (includes time in activities that require the physician or other qualified health care professional and does not include time in activities normally performed by clinical staff). Physician's time may include the following activities when performed: Preparing to see the patient (e.g., pre-charting review of records, searching for previously ordered imaging, lab work, and nerve conduction tests) Review of prior analgesic pharmacotherapies. Reviewing PMP Interpreting ordered tests (e.g., lab work, imaging, nerve conduction tests) Performing post-procedure evaluations, including interpretation of diagnostic procedures Obtaining and/or reviewing separately obtained history Performing a medically appropriate examination and/or evaluation Counseling and educating the patient/family/caregiver Ordering medications, tests, or procedures Referring and communicating with other health care professionals (when not separately reported) Documenting clinical information in the electronic or other health record Independently interpreting results (not separately reported) and communicating results to the patient/ family/caregiver Care coordination (not separately reported)  Note by: Wallie Sherry, MD (AI and TTS technology used. I apologize for any typographical errors that were not detected and corrected.) Date: 07/22/2023; Time: 1:28 PM

## 2023-07-23 ENCOUNTER — Telehealth: Payer: Self-pay | Admitting: Student in an Organized Health Care Education/Training Program

## 2023-07-23 NOTE — Telephone Encounter (Signed)
 Called patient to get insurance information.  ID [id number] [id number] RX PCN [id number] When PA attempted, it says no member can be found.  Called patient and she states I need to call her back at 1130 when she is at lunch.

## 2023-07-23 NOTE — Telephone Encounter (Signed)
 PT called stated that the pharmacy need a PA for Butrans.

## 2023-07-25 LAB — COMPLIANCE DRUG ANALYSIS, UR

## 2023-07-28 ENCOUNTER — Telehealth: Payer: Self-pay | Admitting: Student in an Organized Health Care Education/Training Program

## 2023-07-28 ENCOUNTER — Telehealth: Payer: Self-pay

## 2023-07-28 NOTE — Telephone Encounter (Signed)
 PT called to see if the PA had been send in so she can pick up medication.

## 2023-07-28 NOTE — Telephone Encounter (Signed)
 Called pharmacy and they are still waiting for PA.  Another form received for this medication PA, completed and sent.

## 2023-07-28 NOTE — Telephone Encounter (Signed)
 Insurance Treatment Denial Note  Date order was entered:  Order entered by: Wallie Sherry, MD Requested treatment: Lesi Reason for denial:  No nerve root compression, pinched nerve, impingement Recommended for approval:  see below what insurance letter stated   Your doctor is requesting to put medicine in your back, esi. You have back pain. The request was made on 07/23/23. You are 53 years old. Your doctor did not tell us  that you have a nerve pinched 9 nerve root impingement by imaging or testing) The request for injection is denied. It is not medically necessary.

## 2023-07-30 ENCOUNTER — Other Ambulatory Visit: Payer: Self-pay | Admitting: Internal Medicine

## 2023-07-30 DIAGNOSIS — R1012 Left upper quadrant pain: Secondary | ICD-10-CM

## 2023-07-31 NOTE — Telephone Encounter (Signed)
I called the patient and let her know that they denied the auth.

## 2023-08-07 NOTE — Telephone Encounter (Signed)
They said they tried to call you and left vm on your cell phone. They tried again today, a little while ago. They will try to call back after 3 today but you can check your vm and he left a number for you to call back.

## 2023-08-08 ENCOUNTER — Ambulatory Visit (INDEPENDENT_AMBULATORY_CARE_PROVIDER_SITE_OTHER): Payer: No Typology Code available for payment source | Admitting: Orthopedic Surgery

## 2023-08-08 ENCOUNTER — Encounter: Payer: Self-pay | Admitting: Orthopedic Surgery

## 2023-08-08 DIAGNOSIS — M4726 Other spondylosis with radiculopathy, lumbar region: Secondary | ICD-10-CM | POA: Diagnosis not present

## 2023-08-08 DIAGNOSIS — M47816 Spondylosis without myelopathy or radiculopathy, lumbar region: Secondary | ICD-10-CM

## 2023-08-08 DIAGNOSIS — G629 Polyneuropathy, unspecified: Secondary | ICD-10-CM | POA: Diagnosis not present

## 2023-08-08 DIAGNOSIS — M5126 Other intervertebral disc displacement, lumbar region: Secondary | ICD-10-CM | POA: Diagnosis not present

## 2023-08-08 DIAGNOSIS — M5416 Radiculopathy, lumbar region: Secondary | ICD-10-CM

## 2023-08-08 NOTE — Progress Notes (Signed)
Telephone Visit- Progress Note: Referring Physician:  Billie Lade, MD [address] [place],  [place] [address]  Primary Physician:  Billie Lade, MD  This visit was performed via telephone.  Patient location: home Provider location: office  I spent a total of 10 minutes non-face-to-face activities for this visit on the date of this encounter including review of current clinical condition and response to treatment.    Patient has given verbal consent to this telephone visits and we reviewed the limitations of a telephone visit. Patient wishes to proceed.    Chief Complaint:  follow up  History of Present Illness: Erin Murray is a 53 y.o. female has a history of TIA, migraines, HTN, DM, hyperlipidemia, FM, seizures, and obesity.   Last seen by me on 06/18/23 for back and bilateral leg pain. She has known lumbar spondylosis with facet hypertrophy and disc bulge at L4-L5 along with mild lateral recess narrowing bilaterally.    Leg pain is likely due to underlying spondylosis. Leg pain is likely from lateral recess narrowing L4-L5.   She was given lumbar HEP at her last visit and sent to pain management to consider injections.   She saw Dr. Cherylann Ratel on 07/22/23- he recommended L4-L5 ESI, but this has not been approved yet by insurance. He also started her on butrans patch (also was denied by insurance) and referred her to psychiatry.   Phone visit scheduled for follow up.    She continues with constant LBP with constant left lateral leg pain to her foot and intermittent right lateral leg pain to her foot. She's been having more numbness/tingling in both legs- has known neuropathy but it has been worse lately. She notes some weakness in her left leg.   She tried the lumbar exercises I gave her and stopped them as they were not helping.   She is doing okay at work- took cane to use when she has to walk to bathroom (other end of building).    Bowel/Bladder Dysfunction:  none. No incontinence. No perineal numbness.    Conservative measures:  Physical therapy: Saw PT for initial eval on 05/22/23, gave lumbar HEP at last visit Multimodal medical therapy including regular antiinflammatories: prednisone, norco, flexeril, mobic  Injections: No epidural steroid injections   Past Surgery: no spinal surgery   Exam: No exam done as this was a telephone encounter.     Imaging: Nothing new.   Assessment and Plan: [name] continues with constant LBP with constant left lateral leg pain to her foot and intermittent right lateral leg pain to her foot. She's been having more numbness/tingling in both legs- has known neuropathy but it has been worse lately. She notes some weakness in her left leg.    She has known lumbar spondylosis with facet hypertrophy and disc bulge at L4-L5 along with mild lateral recess narrowing bilaterally.    Leg pain is likely due to underlying spondylosis. Leg pain is likely from lateral recess narrowing L4-L5.    Treatment options discussed with patient and following plan made:   - She will restart lumbar HEP. Discussed it would take time to see improvement. Unable to do PT now due to work/family. May be able to revisit when her son gets a car.  - Message to pain management Olegario Messier) for update regarding ESI and butrans which have both been denied by her insurance.  - I will check her chart in 3-4 weeks and we can regroup to decide on  follow up.  - She lives in [place] so would make more sense for her to follow up with me after injection is done.    Drake Leach PA-C Neurosurgery

## 2023-08-12 ENCOUNTER — Telehealth: Payer: Self-pay | Admitting: Student in an Organized Health Care Education/Training Program

## 2023-08-12 DIAGNOSIS — G8929 Other chronic pain: Secondary | ICD-10-CM

## 2023-08-12 MED ORDER — BELBUCA 300 MCG BU FILM: 1.0000 | ORAL_FILM | Freq: Two times a day (BID) | BUCCAL | 1 refills | Status: AC

## 2023-08-12 NOTE — Telephone Encounter (Signed)
Patient is calling about Butrans PA? Says she has been trying to get her meds? Has this been approved? Please send in as Urgent   [phone]

## 2023-08-12 NOTE — Telephone Encounter (Signed)
Patient informed, given instructions on how to use the medication.

## 2023-08-13 NOTE — Telephone Encounter (Signed)
Patient says pharmacy told her meds need authorization

## 2023-08-14 ENCOUNTER — Telehealth: Payer: Self-pay | Admitting: Student in an Organized Health Care Education/Training Program

## 2023-08-14 NOTE — Telephone Encounter (Signed)
PA has been done

## 2023-08-14 NOTE — Telephone Encounter (Signed)
Patient says her Buprenorphine needs PA .please advise patient when done

## 2023-08-16 ENCOUNTER — Other Ambulatory Visit: Payer: Self-pay | Admitting: Internal Medicine

## 2023-08-16 DIAGNOSIS — R000 Tachycardia, unspecified: Secondary | ICD-10-CM

## 2023-08-29 ENCOUNTER — Emergency Department (HOSPITAL_COMMUNITY)
Admission: EM | Admit: 2023-08-29 | Discharge: 2023-08-29 | Disposition: A | Discharge status: Home / Self Care | Payer: No Typology Code available for payment source | Attending: Emergency Medicine | Admitting: Emergency Medicine

## 2023-08-29 ENCOUNTER — Encounter (HOSPITAL_COMMUNITY): Payer: Self-pay

## 2023-08-29 ENCOUNTER — Emergency Department (HOSPITAL_COMMUNITY): Payer: No Typology Code available for payment source

## 2023-08-29 ENCOUNTER — Other Ambulatory Visit: Payer: Self-pay

## 2023-08-29 DIAGNOSIS — G40909 Epilepsy, unspecified, not intractable, without status epilepticus: Secondary | ICD-10-CM | POA: Insufficient documentation

## 2023-08-29 DIAGNOSIS — R55 Syncope and collapse: Secondary | ICD-10-CM | POA: Insufficient documentation

## 2023-08-29 DIAGNOSIS — Z7982 Long term (current) use of aspirin: Secondary | ICD-10-CM | POA: Diagnosis not present

## 2023-08-29 DIAGNOSIS — R569 Unspecified convulsions: Secondary | ICD-10-CM

## 2023-08-29 LAB — CBC WITH DIFFERENTIAL/PLATELET
Abs Immature Granulocytes: 0.02 10*3/uL (ref 0.00–0.07)
Basophils Absolute: 0.1 10*3/uL (ref 0.0–0.1)
Basophils Relative: 1 %
Eosinophils Absolute: 0.3 10*3/uL (ref 0.0–0.5)
Eosinophils Relative: 4 %
HCT: 41.5 % (ref 36.0–46.0)
Hemoglobin: 13.3 g/dL (ref 12.0–15.0)
Immature Granulocytes: 0 %
Lymphocytes Relative: 35 %
Lymphs Abs: 2.7 10*3/uL (ref 0.7–4.0)
MCH: 27.3 pg (ref 26.0–34.0)
MCHC: 32 g/dL (ref 30.0–36.0)
MCV: 85.2 fL (ref 80.0–100.0)
Monocytes Absolute: 0.7 10*3/uL (ref 0.1–1.0)
Monocytes Relative: 9 %
Neutro Abs: 3.9 10*3/uL (ref 1.7–7.7)
Neutrophils Relative %: 51 %
Platelets: 303 10*3/uL (ref 150–400)
RBC: 4.87 MIL/uL (ref 3.87–5.11)
RDW: 13.6 % (ref 11.5–15.5)
WBC: 7.7 10*3/uL (ref 4.0–10.5)
nRBC: 0 % (ref 0.0–0.2)

## 2023-08-29 LAB — BASIC METABOLIC PANEL
Anion gap: 7 (ref 5–15)
BUN: 23 mg/dL — ABNORMAL HIGH (ref 6–20)
CO2: 19 mmol/L — ABNORMAL LOW (ref 22–32)
Calcium: 9 mg/dL (ref 8.9–10.3)
Chloride: 112 mmol/L — ABNORMAL HIGH (ref 98–111)
Creatinine, Ser: 0.68 mg/dL (ref 0.44–1.00)
GFR, Estimated: >60 mL/min (ref >60)
Glucose, Bld: 116 mg/dL — ABNORMAL HIGH (ref 70–99)
Potassium: 3.9 mmol/L (ref 3.5–5.1)
Sodium: 138 mmol/L (ref 135–145)

## 2023-08-29 LAB — CBG MONITORING, ED: Glucose-Capillary: 93 mg/dL (ref 70–99)

## 2023-08-29 NOTE — Discharge Instructions (Signed)
You are seen in the emergency department after a fainting spell.  It is unclear if this was a seizure or not.  We are putting in a referral for you to follow-up with a neurologist.  Please continue your regular medications.  You should not drive until you have been seen by neurology.  Return to the emergency department if any worsening or concerning symptoms

## 2023-08-29 NOTE — ED Provider Notes (Signed)
[place] EMERGENCY DEPARTMENT AT [facility] Provider Note   CSN: [id number] Arrival date & time: 08/29/23  4098     History  No chief complaint on file.   Erin Murray is a 53 y.o. female.  She is brought in by her husband after a syncopal event this morning at home.  She said she was alone in the bathroom when she fell to the floor and was unresponsive.  She woke up on the floor.  Husband had gone out to walk the dog and when he came back he found her.  She does not know how long she was out but she thinks it was brief.  She feels tired but otherwise back to baseline.  No preceding headache chest pain shortness of breath.  She has been eating and drinking okay.  She has a history of unexplained neurologic symptoms including seizures, difficulty with speech, shaking at times.  The history is provided by the patient and the spouse.  Loss of Consciousness Episode history:  Single Most recent episode:  Today Progression:  Resolved Chronicity:  New Context: normal activity   Witnessed: no   Worsened by:  Nothing Associated symptoms: no chest pain, no difficulty breathing, no fever, no focal weakness, no nausea, no shortness of breath and no vomiting        Home Medications Prior to Admission medications   Medication Sig Start Date End Date Taking? Authorizing Provider  aspirin EC 81 MG tablet Take 81 mg by mouth daily.    [provider]  atorvastatin (LIPITOR) 20 MG tablet Take 1 tablet (20 mg total) by mouth daily. 01/29/23   Billie Lade, MD  Buprenorphine HCl (BELBUCA) 300 MCG FILM Place 1 Film inside cheek every 12 (twelve) hours. 08/12/23 09/11/23  Edward Jolly, MD  cyclobenzaprine (FLEXERIL) 10 MG tablet Take 1 tablet (10 mg total) by mouth 3 (three) times daily as needed for muscle spasms. Do not drink alcohol or drive while taking this medication.  May cause drowsiness. 06/20/22   Particia Nearing, PA-C  fluticasone [facility]) 50 MCG/ACT nasal  spray Place 1 spray into both nostrils 2 (two) times daily. 07/23/22   Particia Nearing, PA-C  lisinopril (ZESTRIL) 5 MG tablet Take 1 tablet (5 mg total) by mouth daily. 01/29/23   Billie Lade, MD  melatonin 5 MG TABS Take 20 mg by mouth at bedtime as needed.    [provider]  meloxicam (MOBIC) 7.5 MG tablet Take 1 tablet (7.5 mg total) by mouth daily. 05/23/23   Billie Lade, MD  metoprolol tartrate (LOPRESSOR) 50 MG tablet Take 1 tablet by mouth twice daily 08/18/23   Billie Lade, MD  omeprazole (PRILOSEC) 40 MG capsule Take 40 mg by mouth daily.    [provider]  ondansetron (ZOFRAN) 4 MG tablet Take 1 tablet (4 mg total) by mouth every 8 (eight) hours as needed for nausea or vomiting. 02/03/23   Billie Lade, MD  rizatriptan (MAXALT-MLT) 10 MG disintegrating tablet Take 1 tablet (10 mg total) by mouth as needed for migraine. May repeat in 2 hours if needed 06/04/22   Billie Lade, MD  topiramate (TOPAMAX) 50 MG tablet TAKE 1 TABLET BY MOUTH TWICE DAILY FOR 7 DAYS THEN  INCREASE  TO  2  TABLETS  BY  MOUTH  TWICE  DAILY 06/16/23   Billie Lade, MD  vitamin B-12 (CYANOCOBALAMIN) 1000 MCG tablet Take 5,000 mcg by mouth daily.  [provider]      Allergies    Sulfa antibiotics and Penicillins    Review of Systems   Review of Systems  Constitutional:  Negative for fever.  Eyes:  Negative for visual disturbance.  Respiratory:  Negative for shortness of breath.   Cardiovascular:  Positive for syncope. Negative for chest pain.  Gastrointestinal:  Negative for nausea and vomiting.  Neurological:  Negative for focal weakness.    Physical Exam Updated Vital Signs BP 97/64   Pulse (!) 59   Resp 18   LMP 08/24/2019   SpO2 98%  Physical Exam Vitals and nursing note reviewed.  Constitutional:      General: She is not in acute distress.    Appearance: Normal appearance. She is well-developed.  HENT:     Head: Normocephalic and  atraumatic.  Eyes:     Conjunctiva/sclera: Conjunctivae normal.  Cardiovascular:     Rate and Rhythm: Normal rate and regular rhythm.     Heart sounds: No murmur heard. Pulmonary:     Effort: Pulmonary effort is normal. No respiratory distress.     Breath sounds: Normal breath sounds.  Abdominal:     Palpations: Abdomen is soft.     Tenderness: There is no abdominal tenderness. There is no guarding or rebound.  Musculoskeletal:        General: No deformity.     Cervical back: Neck supple.  Skin:    General: Skin is warm and dry.     Capillary Refill: Capillary refill takes less than 2 seconds.  Neurological:     General: No focal deficit present.     Mental Status: She is alert and oriented to person, place, and time.     Cranial Nerves: No cranial nerve deficit.     Sensory: No sensory deficit.     Motor: No weakness.     ED Results / Procedures / Treatments   Labs (all labs ordered are listed, but only abnormal results are displayed) Labs Reviewed  BASIC METABOLIC PANEL - Abnormal; Notable for the following components:      Result Value   Chloride 112 (*)    CO2 19 (*)    Glucose, Bld 116 (*)    BUN 23 (*)    All other components within normal limits  CBC WITH DIFFERENTIAL/PLATELET  CBG MONITORING, ED    EKG EKG Interpretation Date/Time:  Friday August 29 2023 07:35:52 EST Ventricular Rate:  67 PR Interval:  158 QRS Duration:  117 QT Interval:  442 QTC Calculation: 467 R Axis:   56  Text Interpretation: Sinus rhythm Nonspecific intraventricular conduction delay COPY Confirmed by Meridee Score [phone]) on 08/29/2023 8:05:17 AM  Radiology CT Head Wo Contrast Result Date: 08/29/2023 CLINICAL DATA:  Syncope/presyncope with cerebrovascular cause suspected EXAM: CT HEAD WITHOUT CONTRAST TECHNIQUE: Contiguous axial images were obtained from the base of the skull through the vertex without intravenous contrast. RADIATION DOSE REDUCTION: This exam was performed  according to the departmental dose-optimization program which includes automated exposure control, adjustment of the mA and/or kV according to patient size and/or use of iterative reconstruction technique. COMPARISON:  06/16/2023 FINDINGS: Brain: No evidence of acute infarction, hemorrhage, hydrocephalus, extra-axial collection or mass lesion/mass effect. Vascular: No hyperdense vessel or unexpected calcification. Skull: Normal. Negative for fracture or focal lesion. Sinuses/Orbits: No acute finding. IMPRESSION: Stable, negative head CT. Electronically Signed   By: Tiburcio Pea M.D.   On: 08/29/2023 07:38    Procedures Procedures  Medications Ordered in ED Medications - No data to display  ED Course/ Medical Decision Making/ A&P Clinical Course as of 08/29/23 1720  Fri Aug 29, 2023  0908 Patient was able to ambulate in the department although she was slower and more unsteady than usual.  Typically ambulates with a cane. [MB]    Clinical Course User Index [MB] Terrilee Files, MD                                 Medical Decision Making Amount and/or Complexity of Data Reviewed Labs: ordered. Radiology: ordered.   This patient complains of syncopal event; this involves an extensive number of treatment Options and is a complaint that carries with it a high risk of complications and morbidity. The differential includes syncope, seizure, vasovagal, hypotension, dehydration, metabolic derangement, arrhythmia  I ordered, reviewed and interpreted labs, which included CBC normal chemistries with low bicarb elevation in glucose I ordered imaging studies which included head CT and I independently    visualized and interpreted imaging which showed no acute findings Additional history obtained from patient's husband Previous records obtained and reviewed in epic including prior notes from neurosurgery and PCP Cardiac monitoring reviewed, sinus rhythm Social determinants considered, no  significant barriers Critical Interventions: None  After the interventions stated above, I reevaluated the patient and found patient to be feeling better and stable on her feet.  Admission and further testing considered, she is comfortable plan for outpatient follow-up with neurology.  Recommended she closely also follow-up with her PCP.  Return instructions discussed         Final Clinical Impression(s) / ED Diagnoses Final diagnoses:  Syncope, unspecified syncope type  Seizures [facility])    Rx / DC Orders ED Discharge Orders     None         Terrilee Files, MD 08/29/23 1722

## 2023-08-29 NOTE — ED Notes (Signed)
Walked pt. Very wobbly

## 2023-08-29 NOTE — ED Notes (Addendum)
Pt states she needs to use bathroom and states she is unable to walk due to dizziness. Pt assisted to bathroom in wheelchair and back to bed.

## 2023-08-29 NOTE — ED Triage Notes (Signed)
Pt got up and was getting ready for work when she had syncopal episode. Husband found her in floor. Pt states she doesn't remember what happened but that she had not felt good yesterday all day. Pt c/o bilateral shoulder and neck pain.

## 2023-09-01 ENCOUNTER — Telehealth: Payer: Self-pay | Admitting: Internal Medicine

## 2023-09-01 ENCOUNTER — Ambulatory Visit
Admission: RE | Admit: 2023-09-01 | Discharge: 2023-09-01 | Disposition: A | Discharge status: Home / Self Care | Payer: No Typology Code available for payment source | Source: Ambulatory Visit | Attending: Student in an Organized Health Care Education/Training Program | Admitting: Student in an Organized Health Care Education/Training Program

## 2023-09-01 ENCOUNTER — Encounter: Payer: Self-pay | Admitting: Student in an Organized Health Care Education/Training Program

## 2023-09-01 ENCOUNTER — Ambulatory Visit
Payer: No Typology Code available for payment source | Attending: Student in an Organized Health Care Education/Training Program | Admitting: Student in an Organized Health Care Education/Training Program

## 2023-09-01 DIAGNOSIS — G8929 Other chronic pain: Secondary | ICD-10-CM | POA: Insufficient documentation

## 2023-09-01 DIAGNOSIS — M5416 Radiculopathy, lumbar region: Secondary | ICD-10-CM | POA: Diagnosis not present

## 2023-09-01 MED ORDER — SODIUM CHLORIDE 0.9% FLUSH
2.0000 mL | Freq: Once | INTRAVENOUS | Status: AC
  Administered 2023-09-01: 2 mL

## 2023-09-01 MED ORDER — DEXAMETHASONE SODIUM PHOSPHATE 10 MG/ML IJ SOLN
10.0000 mg | Freq: Once | INTRAMUSCULAR | Status: AC
  Administered 2023-09-01: 10 mg

## 2023-09-01 MED ORDER — DIAZEPAM 5 MG PO TABS
ORAL_TABLET | ORAL | Status: AC
Start: 2023-09-01 — End: ?
  Filled 2023-09-01: qty 1

## 2023-09-01 MED ORDER — LIDOCAINE HCL 2 % IJ SOLN
INTRAMUSCULAR | Status: AC
  Filled 2023-09-01: qty 20

## 2023-09-01 MED ORDER — LIDOCAINE HCL 2 % IJ SOLN
20.0000 mL | Freq: Once | INTRAMUSCULAR | Status: AC
  Administered 2023-09-01: 400 mg

## 2023-09-01 MED ORDER — SODIUM CHLORIDE (PF) 0.9 % IJ SOLN
INTRAMUSCULAR | Status: AC
  Filled 2023-09-01: qty 10

## 2023-09-01 MED ORDER — ROPIVACAINE HCL 2 MG/ML IJ SOLN
INTRAMUSCULAR | Status: AC
  Filled 2023-09-01: qty 20

## 2023-09-01 MED ORDER — DEXAMETHASONE SODIUM PHOSPHATE 10 MG/ML IJ SOLN
INTRAMUSCULAR | Status: AC
  Filled 2023-09-01: qty 1

## 2023-09-01 MED ORDER — DIAZEPAM 5 MG PO TABS
5.0000 mg | ORAL_TABLET | ORAL | Status: AC
  Administered 2023-09-01: 5 mg via ORAL

## 2023-09-01 MED ORDER — IOHEXOL 180 MG/ML  SOLN
10.0000 mL | Freq: Once | INTRAMUSCULAR | Status: AC
  Administered 2023-09-01: 10 mL via EPIDURAL
  Filled 2023-09-01: qty 20

## 2023-09-01 MED ORDER — ROPIVACAINE HCL 2 MG/ML IJ SOLN
2.0000 mL | Freq: Once | INTRAMUSCULAR | Status: AC
  Administered 2023-09-01: 2 mL via EPIDURAL

## 2023-09-01 NOTE — Progress Notes (Signed)
PROVIDER NOTE: Interpretation of information contained herein should be left to medically-trained personnel. Specific patient instructions are provided elsewhere under "Patient Instructions" section of medical record. This document was created in part using STT-dictation technology, any transcriptional errors that may result from this process are unintentional.  Patient: Erin Murray Type: Established DOB: Nov 17, 1970 MRN: [id number] PCP: Billie Lade, MD  Service: Procedure DOS: 09/01/2023 Setting: Ambulatory Location: Ambulatory outpatient facility Delivery: Face-to-face Provider: Edward Jolly, MD Specialty: Interventional Pain Management Specialty designation: 09 Location: Outpatient facility Ref. Prov.: Billie Lade, MD       Interventional Therapy   Type: Lumbar epidural steroid injection (LESI) (interlaminar) #1    Laterality: Left   Level:  L4-5 Level.  Imaging: Fluoroscopic guidance         Anesthesia: Local anesthesia (1-2% Lidocaine) Sedation: Minimal Sedation                       DOS: 09/01/2023  Performed by: Edward Jolly, MD  Purpose: Diagnostic/Therapeutic Indications: Lumbar radicular pain of intraspinal etiology of more than 4 weeks that has failed to respond to conservative therapy and is severe enough to impact quality of life or function. 1. Chronic radicular lumbar pain   2. Lumbar radiculopathy    NAS-11 Pain score:   Pre-procedure: 8 /10   Post-procedure: 5  (walking/standing)/10      Position / Prep / Materials:  Position: Prone w/ head of the table raised (slight reverse trendelenburg) to facilitate breathing.  Prep solution: ChloraPrep (2% chlorhexidine gluconate and 70% isopropyl alcohol) Prep Area: Entire Posterior Lumbar Region from lower scapular tip down to mid buttocks area and from flank to flank. Materials:  Tray: Epidural tray Needle(s):  Type: Epidural needle (Tuohy) Gauge (G):  22 Length: Regular (3.5-in) Qty: 1   H&P (Pre-op  Assessment):  Erin Murray is a 53 y.o. (year old), female patient, seen today for interventional treatment. She  has a past surgical history that includes Ovarian cyst drainage; Tubal ligation; Esophagogastroduodenoscopy (N/A, 10/14/2014); Cholecystectomy; Colonoscopy; Colonoscopy with propofol (N/A, 08/19/2022); Esophagogastroduodenoscopy (egd) with propofol (N/A, 08/19/2022); biopsy (08/19/2022); and polypectomy (08/19/2022). Erin Murray has a current medication list which includes the following prescription(s): aspirin ec, atorvastatin, cyclobenzaprine, fluticasone, lisinopril, melatonin, metoprolol tartrate, omeprazole, ondansetron, rizatriptan, topiramate, cyanocobalamin, belbuca, and meloxicam. Her primarily concern today is the Back Pain  Initial Vital Signs:  Pulse/HCG Rate: 82  Temp: (!) 97.2 F (36.2 C) Resp: 18 BP: 137/81 SpO2: 100 %  BMI: Estimated body mass index is 33.25 kg/m as calculated from the following:   Height as of this encounter: 5\' 6"  (1.676 m).   Weight as of this encounter: 206 lb (93.4 kg).  Risk Assessment: Allergies: Reviewed. She is allergic to sulfa antibiotics and penicillins.  Allergy Precautions: None required Coagulopathies: Reviewed. None identified.  Blood-thinner therapy: None at this time Active Infection(s): Reviewed. None identified. Ms. Knack is afebrile  Site Confirmation: Erin Murray was asked to confirm the procedure and laterality before marking the site Procedure checklist: Completed Consent: Before the procedure and under the influence of no sedative(s), amnesic(s), or anxiolytics, the patient was informed of the treatment options, risks and possible complications. To fulfill our ethical and legal obligations, as recommended by the American Medical Association's Code of Ethics, I have informed the patient of my clinical impression; the nature and purpose of the treatment or procedure; the risks, benefits, and possible complications of the intervention;  the alternatives, including doing nothing; the risk(s) and  benefit(s) of the alternative treatment(s) or procedure(s); and the risk(s) and benefit(s) of doing nothing. The patient was provided information about the general risks and possible complications associated with the procedure. These may include, but are not limited to: failure to achieve desired goals, infection, bleeding, organ or nerve damage, allergic reactions, paralysis, and death. In addition, the patient was informed of those risks and complications associated to Spine-related procedures, such as failure to decrease pain; infection (i.e.: Meningitis, epidural or intraspinal abscess); bleeding (i.e.: epidural hematoma, subarachnoid hemorrhage, or any other type of intraspinal or peri-dural bleeding); organ or nerve damage (i.e.: Any type of peripheral nerve, nerve root, or spinal cord injury) with subsequent damage to sensory, motor, and/or autonomic systems, resulting in permanent pain, numbness, and/or weakness of one or several areas of the body; allergic reactions; (i.e.: anaphylactic reaction); and/or death. Furthermore, the patient was informed of those risks and complications associated with the medications. These include, but are not limited to: allergic reactions (i.e.: anaphylactic or anaphylactoid reaction(s)); adrenal axis suppression; blood sugar elevation that in diabetics may result in ketoacidosis or comma; water retention that in patients with history of congestive heart failure may result in shortness of breath, pulmonary edema, and decompensation with resultant heart failure; weight gain; swelling or edema; medication-induced neural toxicity; particulate matter embolism and blood vessel occlusion with resultant organ, and/or nervous system infarction; and/or aseptic necrosis of one or more joints. Finally, the patient was informed that Medicine is not an exact science; therefore, there is also the possibility of unforeseen or  unpredictable risks and/or possible complications that may result in a catastrophic outcome. The patient indicated having understood very clearly. We have given the patient no guarantees and we have made no promises. Enough time was given to the patient to ask questions, all of which were answered to the patient's satisfaction. Ms. Brymer has indicated that she wanted to continue with the procedure. Attestation: I, the ordering provider, attest that I have discussed with the patient the benefits, risks, side-effects, alternatives, likelihood of achieving goals, and potential problems during recovery for the procedure that I have provided informed consent. Date  Time: 09/01/2023 11:26 AM   Pre-Procedure Preparation:  Monitoring: As per clinic protocol. Respiration, ETCO2, SpO2, BP, heart rate and rhythm monitor placed and checked for adequate function Safety Precautions: Patient was assessed for positional comfort and pressure points before starting the procedure. Time-out: I initiated and conducted the "Time-out" before starting the procedure, as per protocol. The patient was asked to participate by confirming the accuracy of the "Time Out" information. Verification of the correct person, site, and procedure were performed and confirmed by me, the nursing staff, and the patient. "Time-out" conducted as per Joint Commission's Universal Protocol (UP.01.01.01). Time: 1150 Start Time: 1150 hrs.  Description/Narrative of Procedure:          Target: Epidural space via interlaminar opening, initially targeting the lower laminar border of the superior vertebral body. Region: Lumbar Approach: Percutaneous paravertebral  Rationale (medical necessity): procedure needed and proper for the diagnosis and/or treatment of the patient's medical symptoms and needs. Procedural Technique Safety Precautions: Aspiration looking for blood return was conducted prior to all injections. At no point did we inject any  substances, as a needle was being advanced. No attempts were made at seeking any paresthesias. Safe injection practices and needle disposal techniques used. Medications properly checked for expiration dates. SDV (single dose vial) medications used. Description of the Procedure: Protocol guidelines were followed. The procedure needle was introduced  through the skin, ipsilateral to the reported pain, and advanced to the target area. Bone was contacted and the needle walked caudad, until the lamina was cleared. The epidural space was identified using "loss-of-resistance technique" with 2-3 ml of PF-NaCl (0.9% NSS), in a 5cc LOR glass syringe.  Vitals:   09/01/23 1128 09/01/23 1148 09/01/23 1157  BP: 137/81 137/80 (!) 141/82  Pulse:  82 81  Resp:  18 16  Temp: (!) 97.2 F (36.2 C)    SpO2: 100% 100% 99%  Weight: 206 lb (93.4 kg)    Height: 5\' 6"  (1.676 m)      Start Time: 1150 hrs. End Time: 1152 hrs.  Imaging Guidance (Spinal):          Type of Imaging Technique: Fluoroscopy Guidance (Spinal) Indication(s): Fluoroscopy guidance for needle placement to enhance accuracy in procedures requiring precise needle localization for targeted delivery of medication in or near specific anatomical locations not easily accessible without such real-time imaging assistance. Exposure Time: Please see nurses notes. Contrast: Before injecting any contrast, we confirmed that the patient did not have an allergy to iodine, shellfish, or radiological contrast. Once satisfactory needle placement was completed at the desired level, radiological contrast was injected. Contrast injected under live fluoroscopy. No contrast complications. See chart for type and volume of contrast used. Fluoroscopic Guidance: I was personally present during the use of fluoroscopy. "Tunnel Vision Technique" used to obtain the best possible view of the target area. Parallax error corrected before commencing the procedure.  "Direction-depth-direction" technique used to introduce the needle under continuous pulsed fluoroscopy. Once target was reached, antero-posterior, oblique, and lateral fluoroscopic projection used confirm needle placement in all planes. Images permanently stored in EMR. Interpretation: I personally interpreted the imaging intraoperatively. Adequate needle placement confirmed in multiple planes. Appropriate spread of contrast into desired area was observed. No evidence of afferent or efferent intravascular uptake. No intrathecal or subarachnoid spread observed. Permanent images saved into the patient's record.  Antibiotic Prophylaxis:   Anti-infectives (From admission, onward)    None      Indication(s): None identified  Post-operative Assessment:  Post-procedure Vital Signs:  Pulse/HCG Rate: 81  Temp: (!) 97.2 F (36.2 C) Resp: 16 BP: (!) 141/82 SpO2: 99 %  EBL: None  Complications: No immediate post-treatment complications observed by team, or reported by patient.  Note: The patient tolerated the entire procedure well. A repeat set of vitals were taken after the procedure and the patient was kept under observation following institutional policy, for this type of procedure. Post-procedural neurological assessment was performed, showing return to baseline, prior to discharge. The patient was provided with post-procedure discharge instructions, including a section on how to identify potential problems. Should any problems arise concerning this procedure, the patient was given instructions to immediately contact us, at any time, without hesitation. In any case, we plan to contact the patient by telephone for a follow-up status report regarding this interventional procedure.  Comments:  No additional relevant information.  Plan of Care (POC)  Orders:  Orders Placed This Encounter  Procedures   DG PAIN CLINIC C-ARM 1-60 MIN NO REPORT    Intraoperative interpretation by procedural  physician at [facility].    Standing Status:   Standing    Number of Occurrences:   1    Reason for exam::   Assistance in needle guidance and placement for procedures requiring needle placement in or near specific anatomical locations not easily accessible without such assistance.    Medications  ordered for procedure: Meds ordered this encounter  Medications   iohexol (OMNIPAQUE) 180 MG/ML injection 10 mL    Must be Myelogram-compatible. If not available, you may substitute with a water-soluble, non-ionic, hypoallergenic, myelogram-compatible radiological contrast medium.   lidocaine (XYLOCAINE) 2 % (with pres) injection 400 mg   ropivacaine (PF) 2 mg/mL (0.2%) (NAROPIN) injection 2 mL   sodium chloride flush (NS) 0.9 % injection 2 mL   dexamethasone (DECADRON) injection 10 mg   diazepam (VALIUM) tablet 5 mg    Make sure Flumazenil is available in the pyxis when using this medication. If oversedation occurs, administer 0.2 mg IV over 15 sec. If after 45 sec no response, administer 0.2 mg again over 1 min; may repeat at 1 min intervals; not to exceed 4 doses (1 mg)   Medications administered: We administered iohexol, lidocaine, ropivacaine (PF) 2 mg/mL (0.2%), sodium chloride flush, dexamethasone, and diazepam.  See the medical record for exact dosing, route, and time of administration.  Follow-up plan:   Return in about 5 weeks (around 10/06/2023) for F2F PPE.       Left L4/5 ESI 09/01/23    Recent Visits Date Type Provider Dept  07/22/23 Office Visit Edward Jolly, MD [facility]  Showing recent visits within past 90 days and meeting all other requirements Today's Visits Date Type Provider Dept  09/01/23 Procedure visit Edward Jolly, MD [facility]  Showing today's visits and meeting all other requirements Future Appointments Date Type Provider Dept  10/13/23 Appointment Edward Jolly, MD [facility]  Showing future appointments  within next 90 days and meeting all other requirements  Disposition: Discharge home  Discharge (Date  Time): 09/01/2023;   hrs.   Primary Care Physician: Billie Lade, MD Location: [facility] Note by: Edward Jolly, MD (TTS technology used. I apologize for any typographical errors that were not detected and corrected.) Date: 09/01/2023; Time: 1:07 PM  Disclaimer:  Medicine is not an Visual merchandiser. The only guarantee in medicine is that nothing is guaranteed. It is important to note that the decision to proceed with this intervention was based on the information collected from the patient. The Data and conclusions were drawn from the patient's questionnaire, the interview, and the physical examination. Because the information was provided in large part by the patient, it cannot be guaranteed that it has not been purposely or unconsciously manipulated. Every effort has been made to obtain as much relevant data as possible for this evaluation. It is important to note that the conclusions that lead to this procedure are derived in large part from the available data. Always take into account that the treatment will also be dependent on availability of resources and existing treatment guidelines, considered by other Pain Management Practitioners as being common knowledge and practice, at the time of the intervention. For Medico-Legal purposes, it is also important to point out that variation in procedural techniques and pharmacological choices are the acceptable norm. The indications, contraindications, technique, and results of the above procedure should only be interpreted and judged by a Board-Certified Interventional Pain Specialist with extensive familiarity and expertise in the same exact procedure and technique.

## 2023-09-01 NOTE — Progress Notes (Signed)
 Safety precautions to be maintained throughout the outpatient stay will include: orient to surroundings, keep bed in low position, maintain call bell within reach at all times, provide assistance with transfer out of bed and ambulation.

## 2023-09-01 NOTE — Patient Instructions (Signed)
______________________________________________________________________    Post-Procedure Discharge Instructions  Instructions: Apply ice:  Purpose: This will minimize any swelling and discomfort after procedure.  When: Day of procedure, as soon as you get home. How: Fill a plastic sandwich bag with crushed ice. Cover it with a small towel and apply to injection site. How long: (15 min on, 15 min off) Apply for 15 minutes then remove x 15 minutes.  Repeat sequence on day of procedure, until you go to bed. Apply heat:  Purpose: To treat any soreness and discomfort from the procedure. When: Starting the next day after the procedure. How: Apply heat to procedure site starting the day following the procedure. How long: May continue to repeat daily, until discomfort goes away. Food intake: Start with clear liquids (like water) and advance to regular food, as tolerated.  Physical activities: Keep activities to a minimum for the first 8 hours after the procedure. After that, then as tolerated. Driving: If you have received any sedation, be responsible and do not drive. You are not allowed to drive for 24 hours after having sedation. Blood thinner: (Applies only to those taking blood thinners) You may restart your blood thinner 6 hours after your procedure. Insulin: (Applies only to Diabetic patients taking insulin) As soon as you can eat, you may resume your normal dosing schedule. Infection prevention: Keep procedure site clean and dry. Shower daily and clean area with soap and water. Post-procedure Pain Diary: Extremely important that this be done correctly and accurately. Recorded information will be used to determine the next step in treatment. For the purpose of accuracy, follow these rules: Evaluate only the area treated. Do not report or include pain from an untreated area. For the purpose of this evaluation, ignore all other areas of pain, except for the treated area. After your procedure,  avoid taking a long nap and attempting to complete the pain diary after you wake up. Instead, set your alarm clock to go off every hour, on the hour, for the initial 8 hours after the procedure. Document the duration of the numbing medicine, and the relief you are getting from it. Do not go to sleep and attempt to complete it later. It will not be accurate. If you received sedation, it is likely that you were given a medication that may cause amnesia. Because of this, completing the diary at a later time may cause the information to be inaccurate. This information is needed to plan your care. Follow-up appointment: Keep your post-procedure follow-up evaluation appointment after the procedure (usually 2 weeks for most procedures, 6 weeks for radiofrequencies). DO NOT FORGET to bring you pain diary with you.   Expect: (What should I expect to see with my procedure?) From numbing medicine (AKA: Local Anesthetics): Numbness or decrease in pain. You may also experience some weakness, which if present, could last for the duration of the local anesthetic. Onset: Full effect within 15 minutes of injected. Duration: It will depend on the type of local anesthetic used. On the average, 1 to 8 hours.  From steroids (Applies only if steroids were used): Decrease in swelling or inflammation. Once inflammation is improved, relief of the pain will follow. Onset of benefits: Depends on the amount of swelling present. The more swelling, the longer it will take for the benefits to be seen. In some cases, up to 10 days. Duration: Steroids will stay in the system x 2 weeks. Duration of benefits will depend on multiple posibilities including persistent irritating factors. Side-effects: If  present, they may typically last 2 weeks (the duration of the steroids). Frequent: Cramps (if they occur, drink Gatorade and take over-the-counter Magnesium 450-500 mg once to twice a day); water retention with temporary weight gain;  increases in blood sugar; decreased immune system response; increased appetite. Occasional: Facial flushing (red, warm cheeks); mood swings; menstrual changes. Uncommon: Long-term decrease or suppression of natural hormones; bone thinning. (These are more common with higher doses or more frequent use. This is why we prefer that our patients avoid having any injection therapies in other practices.)  Very Rare: Severe mood changes; psychosis; aseptic necrosis. From procedure: Some discomfort is to be expected once the numbing medicine wears off. This should be minimal if ice and heat are applied as instructed.  Call if: (When should I call?) You experience numbness and weakness that gets worse with time, as opposed to wearing off. New onset bowel or bladder incontinence. (Applies only to procedures done in the spine)  Emergency Numbers: Durning business hours (Monday - Thursday, 8:00 AM - 4:00 PM) (Friday, 9:00 AM - 12:00 Noon): [phone] After hours: [phone] NOTE: If you are having a problem and are unable connect with, or to talk to a provider, then go to your nearest urgent care or emergency department. If the problem is serious and urgent, please call 911. ______________________________________________________________________    Epidural Steroid Injection  An epidural steroid injection is a shot of steroid medicine, also called cortisone, and a numbing medicine that is given into the epidural space. This space is between the spinal cord and the bones of the back. This shot helps relieve pain caused by an irritated or swollen nerve root. The pain relief you get from the injection depends on the cause of your condition and how long your pain lasts. You may have a period of slightly more pain after your injection, before the steroid medicine takes effect. This medicine usually starts working within 1-3 days. In some cases, you might need 7-10 days to feel the full effect. Tell your  health care provider about: Any allergies you have. All medicines you are taking, including vitamins, herbs, eye drops, creams, and over-the-counter medicines. Any problems you or family members have had with anesthesia. Any bleeding problems you have. Any surgeries you have had. Any medical conditions you have. Whether you are pregnant or may be pregnant. What are the risks? Your health care provider will talk with you about risks. These may include: Headache. Bleeding. Infection. Allergic reaction to medicines or dyes. Nerve damage. Not being able to move (paralysis). This is rare. What happens before the procedure? Medicines Ask your provider about: Changing or stopping your regular medicines. These include any diabetes medicines or blood thinners you take. Taking medicines such as aspirin and ibuprofen. These medicines can thin your blood. Do not take them unless your provider tells you to. Taking over-the-counter medicines, vitamins, herbs, and supplements. General instructions Follow instructions from your provider about what you may eat and drink. Ask your provider what steps will be taken to help prevent infection. If you will be going home right after the procedure, plan to have a responsible adult: Take you home from the hospital or clinic. You will not be allowed to drive. Care for you for the time you are told. If you have diabetes or prediabetes, talk with your provider about how to manage your blood sugar (glucose). Steroid medicine can make your blood sugar go up and stay high for a few days. Your provider  can make a plan to make sure your blood sugar level stays under control. What happens during the procedure? An IV will be inserted into one of your veins. You may be given a sedative to help you relax. You will be asked to sit or lie on your side. The injection site will be cleaned. An X-ray machine will be used to guide the needle close to the nerve that is causing  pain. A needle will be put through your skin into the epidural space. This may cause you some discomfort. Contrast dye may be injected at the site to make sure that the steroid medicine will be sent to the exact place it needs to go. The steroid medicine and a numbing medicine will be injected into the epidural space for pain relief. The needle will be removed. A bandage (dressing) will be put over the injection site. The procedure may vary among providers and hospitals. What happens after the procedure? Your blood pressure, heart rate, breathing rate, and blood oxygen level will be monitored until you leave the hospital or clinic. Your IV will be removed. Your arm or leg may feel weak or numb for a few hours. This information is not intended to replace advice given to you by your health care provider. Make sure you discuss any questions you have with your health care provider. Document Revised: 10/18/2022 Document Reviewed: 02/08/2022 Elsevier Patient Education  2024 [employer].

## 2023-09-01 NOTE — Telephone Encounter (Signed)
Patient came by the office and needs new referral to go back to [facility].  The patient has been seen there in the past.

## 2023-09-02 ENCOUNTER — Ambulatory Visit (INDEPENDENT_AMBULATORY_CARE_PROVIDER_SITE_OTHER): Payer: No Typology Code available for payment source | Admitting: Internal Medicine

## 2023-09-02 ENCOUNTER — Telehealth: Payer: Self-pay | Admitting: Internal Medicine

## 2023-09-02 ENCOUNTER — Telehealth: Payer: Self-pay

## 2023-09-02 ENCOUNTER — Encounter: Payer: Self-pay | Admitting: Internal Medicine

## 2023-09-02 VITALS — BP 106/72 | HR 74 | Ht 66.0 in | Wt 211.0 lb

## 2023-09-02 DIAGNOSIS — R55 Syncope and collapse: Secondary | ICD-10-CM | POA: Diagnosis not present

## 2023-09-02 DIAGNOSIS — Z87898 Personal history of other specified conditions: Secondary | ICD-10-CM

## 2023-09-02 NOTE — Telephone Encounter (Signed)
 Post procedure follow up.  LM

## 2023-09-02 NOTE — Telephone Encounter (Signed)
Referral sent to [facility] as Patient requested.

## 2023-09-02 NOTE — Telephone Encounter (Signed)
Patient is needing a work note for out of work until get in with the neurologist.

## 2023-09-02 NOTE — Progress Notes (Signed)
Established Patient Office Visit  Subjective   Patient ID: Erin Murray, female    DOB: Jul 20, 1970  Age: 53 y.o. MRN: [id number]  Chief Complaint  Patient presents with   Follow-up    ER follow up from passing out   Ms. Erin Murray returns to care today for ER follow-up.  She presented to the emergency department on 2/14 after a syncopal event at home.  She was reportedly alone in the bathroom, fell to the floor, and was unresponsive.  She does not know how long she was unconscious.  Denies preceding symptoms.  Noted history of seizures, TIA, paresthesias, and chronic migraines.  Previously referred to neurology but has not been able to establish care.  ER workup was reassuring.  No acute findings on imaging or labs identified.  Discharged with close PCP follow-up and was referred to neurology.  Today Ms. Erin Murray says that she feels lethargic.  She says that she typically takes several days to recover from these episodes and has been sleeping a lot.  No recurrent syncopal events since ER discharge on 2/14.  Past Medical History:  Diagnosis Date   Complicated migraine    includes: visual changes, left sided weakness, confusion, weakness, dizziness   Dysrhythmia    Fibromyalgia    Fibromyalgia    GERD (gastroesophageal reflux disease)    Headache(784.0)    Hypertension    Neurological disorder    unnamed neurological disorder   Reflux    Seizures (HCC)    Tachycardia    TIA (transient ischemic attack) 07/15/2008   Past Surgical History:  Procedure Laterality Date   BIOPSY  08/19/2022   Procedure: BIOPSY;  Surgeon: Lanelle Bal, DO;  Location: [facility];  Service: Endoscopy;;   CHOLECYSTECTOMY     COLONOSCOPY     2015 per patient. done in [place], [place].   COLONOSCOPY WITH PROPOFOL N/A 08/19/2022   Procedure: COLONOSCOPY WITH PROPOFOL;  Surgeon: Lanelle Bal, DO;  Location: [facility];  Service: Endoscopy;  Laterality: N/A;  10:15am, asa 3   ESOPHAGOGASTRODUODENOSCOPY N/A  10/14/2014   Procedure: ESOPHAGOGASTRODUODENOSCOPY (EGD);  Surgeon: Malissa Hippo, MD;  Location: [facility];  Service: Endoscopy;  Laterality: N/A;  210 - moved to 12:45 - Ann to notify pt   ESOPHAGOGASTRODUODENOSCOPY (EGD) WITH PROPOFOL N/A 08/19/2022   Procedure: ESOPHAGOGASTRODUODENOSCOPY (EGD) WITH PROPOFOL;  Surgeon: Lanelle Bal, DO;  Location: [facility];  Service: Endoscopy;  Laterality: N/A;   OVARIAN CYST DRAINAGE     POLYPECTOMY  08/19/2022   Procedure: POLYPECTOMY;  Surgeon: Lanelle Bal, DO;  Location: [facility];  Service: Endoscopy;;   TUBAL LIGATION     Social History   Tobacco Use   Smoking status: Never    Passive exposure: Never   Smokeless tobacco: Never  Vaping Use   Vaping status: Never Used  Substance Use Topics   Alcohol use: No   Drug use: No   Family History  Problem Relation Age of Onset   High Cholesterol Mother    Asthma Mother    High Cholesterol Father    Heart disease Father    Colon cancer Father 19   Colon cancer Sister    Breast cancer Sister    Cancer Maternal Uncle    Lung cancer Maternal Uncle    COPD Maternal Grandfather    Allergies  Allergen Reactions   Sulfa Antibiotics Nausea And Vomiting and Other (See Comments)    REACTION: Family members "almost  died" taking this medication   Penicillins Hives   Review of Systems  Constitutional:  Positive for malaise/fatigue.  Neurological:  Positive for loss of consciousness (Recent LOC).      Objective:     BP 106/72 (BP Location: Left Arm, Patient Position: Sitting, Cuff Size: Normal)   Pulse 74   Ht 5\' 6"  (1.676 m)   Wt 211 lb (95.7 kg)   LMP 08/24/2019   SpO2 96%   BMI 34.06 kg/m  BP Readings from Last 3 Encounters:  09/02/23 106/72  09/01/23 (!) 141/82  08/29/23 97/64   Physical Exam Vitals reviewed.  Constitutional:      General: She is not in acute distress.    Appearance: Normal appearance. She is obese. She is not toxic-appearing.  HENT:      Head: Normocephalic and atraumatic.     Right Ear: External ear normal.     Left Ear: External ear normal.     Nose: Nose normal. No congestion or rhinorrhea.     Mouth/Throat:     Mouth: Mucous membranes are moist.     Pharynx: Oropharynx is clear. No oropharyngeal exudate or posterior oropharyngeal erythema.  Eyes:     General: No scleral icterus.    Extraocular Movements: Extraocular movements intact.     Conjunctiva/sclera: Conjunctivae normal.     Pupils: Pupils are equal, round, and reactive to light.  Cardiovascular:     Rate and Rhythm: Normal rate and regular rhythm.     Pulses: Normal pulses.     Heart sounds: Normal heart sounds. No murmur heard.    No friction rub. No gallop.  Pulmonary:     Effort: Pulmonary effort is normal.     Breath sounds: Normal breath sounds. No wheezing, rhonchi or rales.  Abdominal:     General: Abdomen is flat. Bowel sounds are normal. There is no distension.     Palpations: Abdomen is soft.     Tenderness: There is no abdominal tenderness.  Musculoskeletal:        General: No swelling. Normal range of motion.     Cervical back: Normal range of motion.     Right lower leg: No edema.     Left lower leg: No edema.  Lymphadenopathy:     Cervical: No cervical adenopathy.  Skin:    General: Skin is warm and dry.     Capillary Refill: Capillary refill takes less than 2 seconds.     Coloration: Skin is not jaundiced.  Neurological:     General: No focal deficit present.     Mental Status: She is alert and oriented to person, place, and time.     Gait: Gait abnormal (Ambulates with a cane).  Psychiatric:        Mood and Affect: Mood normal.        Behavior: Behavior normal.   Last CBC Lab Results  Component Value Date   WBC 7.7 08/29/2023   HGB 13.3 08/29/2023   HCT 41.5 08/29/2023   MCV 85.2 08/29/2023   MCH 27.3 08/29/2023   RDW 13.6 08/29/2023   PLT 303 08/29/2023   Last metabolic panel Lab Results  Component Value Date    GLUCOSE 116 (H) 08/29/2023   NA 138 08/29/2023   K 3.9 08/29/2023   CL 112 (H) 08/29/2023   CO2 19 (L) 08/29/2023   BUN 23 (H) 08/29/2023   CREATININE 0.68 08/29/2023   GFRNONAA >60 08/29/2023   CALCIUM 9.0 08/29/2023   PROT 7.4  05/15/2023   ALBUMIN 4.0 05/15/2023   LABGLOB 3.0 06/04/2022   AGRATIO 1.5 06/04/2022   BILITOT 0.4 05/15/2023   ALKPHOS 108 05/15/2023   AST 15 05/15/2023   ALT 16 05/15/2023   ANIONGAP 7 08/29/2023   Last lipids Lab Results  Component Value Date   CHOL 186 06/04/2022   HDL 35 (L) 06/04/2022   LDLCALC 123 (H) 06/04/2022   TRIG 157 (H) 06/04/2022   CHOLHDL 5.3 (H) 06/04/2022   Last hemoglobin A1c Lab Results  Component Value Date   HGBA1C 6.5 (H) 01/01/2023   Last thyroid functions Lab Results  Component Value Date   TSH 3.860 06/04/2022   Last vitamin D Lab Results  Component Value Date   VD25OH 28.1 (L) 01/01/2023   Last vitamin B12 and Folate Lab Results  Component Value Date   VITAMINB12 335 06/04/2022   FOLATE 9.5 06/04/2022   The 10-year ASCVD risk score (Arnett DK, et al., 2019) is: 4%    Assessment & Plan:   Problem List Items Addressed This Visit       Syncope and collapse   Presenting today for ER follow-up after ER presentation 2/14 following a syncopal event with loss of consciousness.  As described above, she fell in her bathroom and was unconscious for an unknown period of time.  Documented history of seizures, migraines, TIAs, and paresthesias.  Previously referred to neurology but has not been able to establish care.  ER workup was reassuring.  Patient states that she has had multiple similar episodes previously without clear diagnosis.  A new neurology referral was placed upon ER discharge but she has not been contacted for an appointment yet.  Physical exam today is reassuring.  Question seizures vs complex migraine vs conversion disorder.  -No further treatment indicated today.  Patient was provided with contact  information for the neurology office she was referred to.  I recommended calling to follow-up on her referral if she has not heard from them by Monday (2/24). -Given recent clinical events in the absence of clear diagnosis, I recommended that she not drive for 6 months from the date of her event in accordance with [place] law. -She will return to care for routine follow-up in 3 months      Return in about 3 months (around 11/30/2023).   Billie Lade, MD

## 2023-09-02 NOTE — Patient Instructions (Signed)
It was a pleasure to see you today.  Thank you for giving [place] the opportunity to be involved in your care.  Below is a brief recap of your visit and next steps.  We will plan to see you again in 3 months.  Summary No medication changes today See number for neurology below Follow up in 3 months Per [place] Law - you should not drive until your are 6 months free of seizure activity   [phone]

## 2023-09-02 NOTE — Assessment & Plan Note (Signed)
Presenting today for ER follow-up after ER presentation 2/14 following a syncopal event with loss of consciousness.  As described above, she fell in her bathroom and was unconscious for an unknown period of time.  Documented history of seizures, migraines, TIAs, and paresthesias.  Previously referred to neurology but has not been able to establish care.  ER workup was reassuring.  Patient states that she has had multiple similar episodes previously without clear diagnosis.  A new neurology referral was placed upon ER discharge but she has not been contacted for an appointment yet.  Physical exam today is reassuring.  Question seizures vs complex migraine vs conversion disorder.  -No further treatment indicated today.  Patient was provided with contact information for the neurology office she was referred to.  I recommended calling to follow-up on her referral if she has not heard from them by Monday (2/24). -Given recent clinical events in the absence of clear diagnosis, I recommended that she not drive for 6 months from the date of her event in accordance with [place] law. -She will return to care for routine follow-up in 3 months

## 2023-09-04 NOTE — Telephone Encounter (Signed)
Letter sent  to patient, mychart message sent

## 2023-09-04 NOTE — Telephone Encounter (Signed)
Copied from CRM [id number]. Topic: Medical Record Request - Provider/Facility Request >> Sep 04, 2023 10:43 AM Ivette P wrote: Reason for CRM: Pt is calling in about a work note for her work to clear her until pt sees [facility]. Pt requesting a follow up, callback # [phone]   Pt spoke to someone yesterday:  Patient is needing a work note for out of work until get in with the neurologist.  Tax adviser signed by Earnie Larsson C at 09/02/2023  5:11 PM

## 2023-09-05 ENCOUNTER — Telehealth: Payer: Self-pay | Admitting: Orthopedic Surgery

## 2023-09-05 NOTE — Telephone Encounter (Signed)
She had left [place] on 09/01/23. Please schedule her a follow up with me in 3-4 weeks.

## 2023-09-08 NOTE — Telephone Encounter (Signed)
 09/29/23 at 8:30am.

## 2023-09-09 ENCOUNTER — Telehealth: Payer: Self-pay | Admitting: Internal Medicine

## 2023-09-09 NOTE — Telephone Encounter (Signed)
 Initial disability physician statement form  Noted Copied [employer] (put in provider box)  Fax [phone] when completed and leave a copy at front desk for patient to pick up.

## 2023-09-11 NOTE — Telephone Encounter (Signed)
 Patient called still needing a out of work note still dizzy and passing out in the floor does not feel safe to work.  Need to be out of work til 03.04.2025.  Can patient get a medical statement?. Neurologist appointment on 09/16/2023. Call patient at [phone] if she can pick up this letter.  Needs the disability form ASAP or medical statement.

## 2023-09-11 NOTE — Telephone Encounter (Signed)
 Awaiting dr Rolm Bookbinder

## 2023-09-17 DIAGNOSIS — Z0279 Encounter for issue of other medical certificate: Secondary | ICD-10-CM

## 2023-09-18 NOTE — Telephone Encounter (Signed)
Completed and faxed with confirmation.

## 2023-09-19 NOTE — Progress Notes (Signed)
 Referring Physician:  No referring provider defined for this encounter.  Primary Physician:  Billie Lade, MD  History of Present Illness: Ms. Erin Murray has a history of TIA, migraines, HTN, DM, hyperlipidemia, FM, seizures, and obesity.   I did a phone visit with her on 08/08/23. She has known lumbar spondylosis with facet hypertrophy and disc bulge at L4-L5 along with mild lateral recess narrowing bilaterally.    Leg pain is likely due to underlying spondylosis. Leg pain is likely from lateral recess narrowing L4-L5.   She had left L4-L5 IL ESI on 09/01/23. She is here for follow up.   She has seen improvement with injection, but she continues with constant LBP that is not as severe. She has been taking it easy lately and her leg pain is better. She is not back to her normal activities- neurology has her out of work for 6 weeks due to possible seizures. Pain is better with sitting or with heated blanket. Pain is worse with standing and walking.   Bowel/Bladder Dysfunction: none.   Conservative measures:  Physical therapy: Saw PT for initial eval on 05/22/23, gave her lumbar HEP Multimodal medical therapy including regular antiinflammatories: prednisone, norco, flexeril, mobic  Injections:  Left L4-L5 IL ESI on 09/01/23  Past Surgery: no spinal surgery  Erin Murray has no symptoms of cervical myelopathy.  The symptoms are causing a significant impact on the patient's life.   Review of Systems:  A 10 point review of systems is negative, except for the pertinent positives and negatives detailed in the HPI.  Past Medical History: Past Medical History:  Diagnosis Date   Complicated migraine    includes: visual changes, left sided weakness, confusion, weakness, dizziness   Dysrhythmia    Fibromyalgia    Fibromyalgia    GERD (gastroesophageal reflux disease)    Headache(784.0)    Hypertension    Neurological disorder    unnamed neurological disorder   Reflux     Seizures (HCC)    Tachycardia    TIA (transient ischemic attack) 07/15/2008    Past Surgical History: Past Surgical History:  Procedure Laterality Date   BIOPSY  08/19/2022   Procedure: BIOPSY;  Surgeon: Lanelle Bal, DO;  [facility];  Service: Endoscopy;;   CHOLECYSTECTOMY     COLONOSCOPY     2015 per patient. done in [place], [place].   COLONOSCOPY WITH PROPOFOL N/A 08/19/2022   Procedure: COLONOSCOPY WITH PROPOFOL;  Surgeon: Lanelle Bal, DO;  [facility];  Service: Endoscopy;  Laterality: N/A;  10:15am, asa 3   ESOPHAGOGASTRODUODENOSCOPY N/A 10/14/2014   Procedure: ESOPHAGOGASTRODUODENOSCOPY (EGD);  Surgeon: Malissa Hippo, MD;  [facility];  Service: Endoscopy;  Laterality: N/A;  210 - moved to 12:45 - Ann to notify pt   ESOPHAGOGASTRODUODENOSCOPY (EGD) WITH PROPOFOL N/A 08/19/2022   Procedure: ESOPHAGOGASTRODUODENOSCOPY (EGD) WITH PROPOFOL;  Surgeon: Lanelle Bal, DO;  [facility];  Service: Endoscopy;  Laterality: N/A;   OVARIAN CYST DRAINAGE     POLYPECTOMY  08/19/2022   Procedure: POLYPECTOMY;  Surgeon: Lanelle Bal, DO;  [facility];  Service: Endoscopy;;   TUBAL LIGATION      Allergies: Allergies as of 09/29/2023 - Review Complete 09/02/2023  Allergen Reaction Noted   Sulfa antibiotics Nausea And Vomiting and Other (See Comments) 02/20/2012   Penicillins Hives 02/20/2012    Medications: Outpatient Encounter Medications as of 09/29/2023  Medication Sig   aspirin EC  81 MG tablet Take 81 mg by mouth daily.   atorvastatin (LIPITOR) 20 MG tablet Take 1 tablet (20 mg total) by mouth daily.   cyclobenzaprine (FLEXERIL) 10 MG tablet Take 1 tablet (10 mg total) by mouth 3 (three) times daily as needed for muscle spasms. Do not drink alcohol or drive while taking this medication.  May cause drowsiness.   fluticasone (FLONASE) 50 MCG/ACT nasal spray Place 1 spray into both nostrils 2 (two) times daily.    lisinopril (ZESTRIL) 5 MG tablet Take 1 tablet (5 mg total) by mouth daily.   melatonin 5 MG TABS Take 20 mg by mouth at bedtime as needed.   meloxicam (MOBIC) 7.5 MG tablet Take 1 tablet (7.5 mg total) by mouth daily. (Patient not taking: Reported on 09/01/2023)   metoprolol tartrate (LOPRESSOR) 50 MG tablet Take 1 tablet by mouth twice daily   omeprazole (PRILOSEC) 40 MG capsule Take 40 mg by mouth daily.   ondansetron (ZOFRAN) 4 MG tablet Take 1 tablet (4 mg total) by mouth every 8 (eight) hours as needed for nausea or vomiting.   rizatriptan (MAXALT-MLT) 10 MG disintegrating tablet Take 1 tablet (10 mg total) by mouth as needed for migraine. May repeat in 2 hours if needed   topiramate (TOPAMAX) 50 MG tablet TAKE 1 TABLET BY MOUTH TWICE DAILY FOR 7 DAYS THEN  INCREASE  TO  2  TABLETS  BY  MOUTH  TWICE  DAILY   vitamin B-12 (CYANOCOBALAMIN) 1000 MCG tablet Take 5,000 mcg by mouth daily.   No facility-administered encounter medications on file as of 09/29/2023.    Social History: Social History   Tobacco Use   Smoking status: Never    Passive exposure: Never   Smokeless tobacco: Never  Vaping Use   Vaping status: Never Used  Substance Use Topics   Alcohol use: No   Drug use: No    Family Medical History: Family History  Problem Relation Age of Onset   High Cholesterol Mother    Asthma Mother    High Cholesterol Father    Heart disease Father    Colon cancer Father 60   Colon cancer Sister    Breast cancer Sister    Cancer Maternal Uncle    Lung cancer Maternal Uncle    COPD Maternal Grandfather     Physical Examination: There were no vitals filed for this visit.    Awake, alert, oriented to person, place, and time.  Speech is clear and fluent. Fund of knowledge is appropriate.   Cranial Nerves: Pupils equal round and reactive to light.  Facial tone is symmetric.    She has lower posterior lumbar tenderness.   No abnormal lesions on exposed skin.    Strength:  Side Iliopsoas Quads Hamstring PF DF EHL  R 5 5 5 5 5 5   L 5 5 5 5 5 5    No gross weakness, but does not give great effort with strength testing.   Reflexes are 2+ and symmetric at the patella and achilles.    Clonus is not present.   Bilateral lower extremity sensation is intact to light touch.     Gait is slow, she uses a cane.   Medical Decision Making  Imaging: None  Assessment and Plan: Ms. Erin Murray is seeing neurology for possible seizures. She is out of work and cannot drive.   She had good improvement with ESI- her leg pain is gone and her LBP is better, but still present. She has  constant LBP that is worse with standing and walking.   She has known lumbar spondylosis with facet hypertrophy and disc bulge at L4-L5 along with mild lateral recess narrowing bilaterally.    Treatment options discussed with patient and following plan made:   - Recommend she follow up with Dr. Cherylann Ratel as scheduled to discuss further injection options.  - Hold on PT- she cannot drive.  - Will message her after her visit with Dr. Cherylann Ratel on 3/31 and regroup with follow up. Will try to do phone call follow up as transportation is an issue (she lives in [place]).    I spent a total of 20 minutes in face-to-face and non-face-to-face activities related to this patient's care today including review of outside records, review of imaging, review of symptoms, physical exam, discussion of differential diagnosis, discussion of treatment options, and documentation.   Drake Leach PA-C Dept. of Neurosurgery

## 2023-09-29 ENCOUNTER — Ambulatory Visit (INDEPENDENT_AMBULATORY_CARE_PROVIDER_SITE_OTHER): Payer: No Typology Code available for payment source | Admitting: Orthopedic Surgery

## 2023-09-29 ENCOUNTER — Encounter: Payer: Self-pay | Admitting: Orthopedic Surgery

## 2023-09-29 VITALS — BP 124/84 | Ht 66.0 in | Wt 210.0 lb

## 2023-09-29 DIAGNOSIS — M4726 Other spondylosis with radiculopathy, lumbar region: Secondary | ICD-10-CM

## 2023-09-29 DIAGNOSIS — M5416 Radiculopathy, lumbar region: Secondary | ICD-10-CM

## 2023-09-29 DIAGNOSIS — M5126 Other intervertebral disc displacement, lumbar region: Secondary | ICD-10-CM

## 2023-09-29 DIAGNOSIS — M47816 Spondylosis without myelopathy or radiculopathy, lumbar region: Secondary | ICD-10-CM

## 2023-10-13 ENCOUNTER — Ambulatory Visit
Payer: No Typology Code available for payment source | Admitting: Student in an Organized Health Care Education/Training Program

## 2023-12-02 ENCOUNTER — Ambulatory Visit: Admitting: Student in an Organized Health Care Education/Training Program

## 2023-12-02 ENCOUNTER — Telehealth: Admitting: Student in an Organized Health Care Education/Training Program

## 2023-12-04 ENCOUNTER — Ambulatory Visit: Payer: No Typology Code available for payment source | Admitting: Internal Medicine

## 2023-12-04 ENCOUNTER — Encounter: Payer: Self-pay | Admitting: Internal Medicine

## 2023-12-04 VITALS — BP 109/78 | HR 114 | Ht 66.0 in | Wt 217.6 lb

## 2023-12-04 DIAGNOSIS — E669 Obesity, unspecified: Secondary | ICD-10-CM

## 2023-12-04 DIAGNOSIS — E559 Vitamin D deficiency, unspecified: Secondary | ICD-10-CM

## 2023-12-04 DIAGNOSIS — E785 Hyperlipidemia, unspecified: Secondary | ICD-10-CM

## 2023-12-04 DIAGNOSIS — Z6835 Body mass index (BMI) 35.0-35.9, adult: Secondary | ICD-10-CM

## 2023-12-04 DIAGNOSIS — E119 Type 2 diabetes mellitus without complications: Secondary | ICD-10-CM

## 2023-12-04 DIAGNOSIS — E1169 Type 2 diabetes mellitus with other specified complication: Secondary | ICD-10-CM | POA: Diagnosis not present

## 2023-12-04 DIAGNOSIS — I10 Essential (primary) hypertension: Secondary | ICD-10-CM | POA: Diagnosis not present

## 2023-12-04 MED ORDER — SEMAGLUTIDE(0.25 OR 0.5MG/DOS) 2 MG/3ML ~~LOC~~ SOPN: 0.2500 mg | PEN_INJECTOR | SUBCUTANEOUS | 0 refills | Status: DC

## 2023-12-04 MED ORDER — LISINOPRIL 5 MG PO TABS: 5.0000 mg | ORAL_TABLET | Freq: Every day | ORAL | 3 refills | Status: AC

## 2023-12-04 MED ORDER — METFORMIN HCL ER 500 MG PO TB24: 500.0000 mg | ORAL_TABLET | Freq: Every day | ORAL | 1 refills | Status: DC

## 2023-12-04 MED ORDER — ATORVASTATIN CALCIUM 20 MG PO TABS: 20.0000 mg | ORAL_TABLET | Freq: Every day | ORAL | 3 refills | Status: DC

## 2023-12-04 NOTE — Progress Notes (Signed)
 Established Patient Office Visit  Subjective   Patient ID: Erin Murray, female    DOB: 02-27-1971  Age: 53 y.o. MRN: [id number]  Chief Complaint  Patient presents with   Care Management    Three month follow up    Arthritis    Patient thinks she has arthritis    Cold Feet    Cold feet especially at night    Erin Murray returns to care today for routine follow-up.  She was last evaluated by me on 2/18 in the setting of syncope with collapse.  In the interim she has been seen by neurosurgery and has established care with neurology.  There have otherwise been no acute interval events.  Today she reports feeling fairly well but endorses neuropathic pain in her feet.  She does not have any acute concerns to discuss.  Past Medical History:  Diagnosis Date   Complicated migraine    includes: visual changes, left sided weakness, confusion, weakness, dizziness   Dysrhythmia    Fibromyalgia    Fibromyalgia    GERD (gastroesophageal reflux disease)    Headache(784.0)    Hypertension    Neurological disorder    unnamed neurological disorder   Reflux    Seizures (HCC)    Tachycardia    TIA (transient ischemic attack) 07/15/2008   Past Surgical History:  Procedure Laterality Date   BIOPSY  08/19/2022   Procedure: BIOPSY;  Surgeon: Vinetta Greening, DO;  [facility];  Service: Endoscopy;;   CHOLECYSTECTOMY     COLONOSCOPY     2015 per patient. done in [place], [place].   COLONOSCOPY WITH PROPOFOL  N/A 08/19/2022   Procedure: COLONOSCOPY WITH PROPOFOL ;  Surgeon: Vinetta Greening, DO;  [facility];  Service: Endoscopy;  Laterality: N/A;  10:15am, asa 3   ESOPHAGOGASTRODUODENOSCOPY N/A 10/14/2014   Procedure: ESOPHAGOGASTRODUODENOSCOPY (EGD);  Surgeon: Ruby Corporal, MD;  [facility];  Service: Endoscopy;  Laterality: N/A;  210 - moved to 12:45 - Ann to notify pt   ESOPHAGOGASTRODUODENOSCOPY (EGD) WITH PROPOFOL  N/A 08/19/2022   Procedure:  ESOPHAGOGASTRODUODENOSCOPY (EGD) WITH PROPOFOL ;  Surgeon: Vinetta Greening, DO;  [facility];  Service: Endoscopy;  Laterality: N/A;   OVARIAN CYST DRAINAGE     POLYPECTOMY  08/19/2022   Procedure: POLYPECTOMY;  Surgeon: Vinetta Greening, DO;  [facility];  Service: Endoscopy;;   TUBAL LIGATION     Social History   Tobacco Use   Smoking status: Never    Passive exposure: Never   Smokeless tobacco: Never  Vaping Use   Vaping status: Never Used  Substance Use Topics   Alcohol use: No   Drug use: No   Family History  Problem Relation Age of Onset   High Cholesterol Mother    Asthma Mother    High Cholesterol Father    Heart disease Father    Colon cancer Father 74   Colon cancer Sister    Breast cancer Sister    Cancer Maternal Uncle    Lung cancer Maternal Uncle    COPD Maternal Grandfather    Allergies  Allergen Reactions   Sulfa Antibiotics Nausea And Vomiting and Other (See Comments)    REACTION: Family members almost died taking this medication   Penicillins Hives   Review of Systems  Constitutional:  Negative for chills and fever.  HENT:  Negative for sore throat.   Respiratory:  Negative for cough and shortness of breath.  Cardiovascular:  Negative for chest pain, palpitations and leg swelling.  Gastrointestinal:  Negative for abdominal pain, blood in stool, constipation, diarrhea, nausea and vomiting.  Genitourinary:  Negative for dysuria and hematuria.  Musculoskeletal:  Negative for myalgias.  Skin:  Negative for itching and rash.  Neurological:  Positive for tingling (Neuropathic pain in both feet, worse at night). Negative for dizziness and headaches.  Psychiatric/Behavioral:  Negative for depression and suicidal ideas.      Objective:     BP 109/78   Pulse (!) 114   Ht 5' 6 (1.676 m)   Wt 217 lb 9.6 oz (98.7 kg)   LMP 08/24/2019   SpO2 98%   BMI 35.12 kg/m  BP Readings from Last 3 Encounters:  12/04/23 109/78   09/29/23 124/84  09/02/23 106/72   Physical Exam Vitals reviewed.  Constitutional:      General: She is not in acute distress.    Appearance: Normal appearance. She is obese. She is not toxic-appearing.  HENT:     Head: Normocephalic and atraumatic.     Right Ear: External ear normal.     Left Ear: External ear normal.     Nose: Nose normal. No congestion or rhinorrhea.     Mouth/Throat:     Mouth: Mucous membranes are moist.     Pharynx: Oropharynx is clear. No oropharyngeal exudate or posterior oropharyngeal erythema.   Eyes:     General: No scleral icterus.    Extraocular Movements: Extraocular movements intact.     Conjunctiva/sclera: Conjunctivae normal.     Pupils: Pupils are equal, round, and reactive to light.    Cardiovascular:     Rate and Rhythm: Normal rate and regular rhythm.     Pulses: Normal pulses.     Heart sounds: Normal heart sounds. No murmur heard.    No friction rub. No gallop.  Pulmonary:     Effort: Pulmonary effort is normal.     Breath sounds: Normal breath sounds. No wheezing, rhonchi or rales.  Abdominal:     General: Abdomen is flat. Bowel sounds are normal. There is no distension.     Palpations: Abdomen is soft.     Tenderness: There is no abdominal tenderness.   Musculoskeletal:        General: No swelling. Normal range of motion.     Cervical back: Normal range of motion.     Right lower leg: No edema.     Left lower leg: No edema.  Lymphadenopathy:     Cervical: No cervical adenopathy.   Skin:    General: Skin is warm and dry.     Capillary Refill: Capillary refill takes less than 2 seconds.     Coloration: Skin is not jaundiced.   Neurological:     General: No focal deficit present.     Mental Status: She is alert and oriented to person, place, and time.   Psychiatric:        Mood and Affect: Mood normal.        Behavior: Behavior normal.   Last CBC Lab Results  Component Value Date   WBC 6.0 12/05/2023   HGB 13.3  12/05/2023   HCT 41.8 12/05/2023   MCV 84 12/05/2023   MCH 26.8 12/05/2023   RDW 13.7 12/05/2023   PLT 290 12/05/2023   Last metabolic panel Lab Results  Component Value Date   GLUCOSE 90 12/05/2023   NA 142 12/05/2023   K 3.5 12/05/2023   CL 107 (  H) 12/05/2023   CO2 19 (L) 12/05/2023   BUN 18 12/05/2023   CREATININE 0.87 12/05/2023   GFRNONAA >60 08/29/2023   CALCIUM  8.9 12/05/2023   PROT 6.9 12/05/2023   ALBUMIN 4.1 12/05/2023   LABGLOB 2.8 12/05/2023   AGRATIO 1.5 06/04/2022   BILITOT 0.2 12/05/2023   ALKPHOS 136 (H) 12/05/2023   AST 23 12/05/2023   ALT 23 12/05/2023   ANIONGAP 7 08/29/2023   Last lipids Lab Results  Component Value Date   CHOL 154 12/05/2023   HDL 21 (L) 12/05/2023   LDLCALC 105 (H) 12/05/2023   TRIG 155 (H) 12/05/2023   CHOLHDL 7.3 (H) 12/05/2023   Last hemoglobin A1c Lab Results  Component Value Date   HGBA1C 6.5 (H) 12/05/2023   Last thyroid functions Lab Results  Component Value Date   TSH 2.950 12/05/2023   Last vitamin D  Lab Results  Component Value Date   VD25OH 28.6 (L) 12/05/2023   Last vitamin B12 and Folate Lab Results  Component Value Date   VITAMINB12 320 12/05/2023   FOLATE 9.2 12/05/2023   The 10-year ASCVD risk score (Arnett DK, et al., 2019) is: 6.3%    Assessment & Plan:   Problem List Items Addressed This Visit       Hypertension   Remains adequately controlled on current regimen of metoprolol  tartrate and lisinopril .  Lisinopril  has been refilled today.      Type 2 diabetes mellitus without complications (HCC)   A1c 6.5 on labs from June 2024.  She is currently attempting to control diabetes with diet alone but expresses frustration over recent weight gain.  Through shared decision making, metformin  500 mg daily and Ozempic  0.25 mg weekly has been prescribed today.  Repeat A1c is pending.      Hyperlipidemia associated with type 2 diabetes mellitus (HCC)   Lipid panel last updated in November 2023.   Total cholesterol 186 and LDL 123.  Atorvastatin  20 mg daily was started in light of this result.  Repeat lipid panel ordered today.      Obesity (BMI 30-39.9) - Primary   Current weight 217 pounds.  BMI 35.1.  She has gained 7 pounds since March despite adhering to healthy dieting and exercise habits.  As otherwise documented, Ozempic  0.25 mg weekly has been prescribed today in the setting of diabetes mellitus and obesity.  Follow-up in 3 months for reassessment.      Return in about 3 months (around 03/05/2024).   Tobi Fortes, MD

## 2023-12-04 NOTE — Patient Instructions (Signed)
 It was a pleasure to see you today.  Thank you for giving us  the opportunity to be involved in your care.  Below is a brief recap of your visit and next steps.  We will plan to see you again in 3 months.  Summary Add metformin and Ozempic  Repeat labs ordered today Follow up in 3 months

## 2023-12-05 ENCOUNTER — Telehealth: Payer: Self-pay | Admitting: Pharmacy Technician

## 2023-12-05 ENCOUNTER — Other Ambulatory Visit (HOSPITAL_COMMUNITY): Payer: Self-pay

## 2023-12-05 NOTE — Telephone Encounter (Signed)
 Pharmacy Patient Advocate Encounter  Received notification from [employer] that Prior Authorization for Ozempic (0.25 or 0.5 MG/DOSE) 2MG /3ML pen-injectors has been APPROVED from 12/05/2023 to 12/04/2024. Ran test claim, Copay is $24.99. This test claim was processed through [facility]- copay amounts may vary at other pharmacies due to pharmacy/plan contracts, or as the patient moves through the different stages of their insurance plan.   PA #/Case ID/Reference #: Key: [id number]

## 2023-12-05 NOTE — Telephone Encounter (Signed)
 Pharmacy Patient Advocate Encounter   Received notification from CoverMyMeds that prior authorization for Ozempic (0.25 or 0.5 MG/DOSE) 2MG /3ML pen-injectors is required/requested.   Insurance verification completed.   The patient is insured through [employer] / IT consultant .   Per test claim: PA required; PA submitted to above mentioned insurance via CoverMyMeds Key/confirmation #/EOC [id number] Status is pending

## 2023-12-06 LAB — CBC WITH DIFFERENTIAL/PLATELET
Basophils Absolute: 0 10*3/uL (ref 0.0–0.2)
Basos: 1 %
EOS (ABSOLUTE): 0.1 10*3/uL (ref 0.0–0.4)
Eos: 2 %
Hematocrit: 41.8 % (ref 34.0–46.6)
Hemoglobin: 13.3 g/dL (ref 11.1–15.9)
Immature Grans (Abs): 0 10*3/uL (ref 0.0–0.1)
Immature Granulocytes: 0 %
Lymphocytes Absolute: 2.3 10*3/uL (ref 0.7–3.1)
Lymphs: 38 %
MCH: 26.8 pg (ref 26.6–33.0)
MCHC: 31.8 g/dL (ref 31.5–35.7)
MCV: 84 fL (ref 79–97)
Monocytes Absolute: 0.5 10*3/uL (ref 0.1–0.9)
Monocytes: 9 %
Neutrophils Absolute: 3.1 10*3/uL (ref 1.4–7.0)
Neutrophils: 50 %
Platelets: 290 10*3/uL (ref 150–450)
RBC: 4.96 x10E6/uL (ref 3.77–5.28)
RDW: 13.7 % (ref 11.7–15.4)
WBC: 6 10*3/uL (ref 3.4–10.8)

## 2023-12-06 LAB — VITAMIN D 25 HYDROXY (VIT D DEFICIENCY, FRACTURES): Vit D, 25-Hydroxy: 28.6 ng/mL — ABNORMAL LOW (ref 30.0–100.0)

## 2023-12-06 LAB — CMP14+EGFR
ALT: 23 IU/L (ref 0–32)
AST: 23 IU/L (ref 0–40)
Albumin: 4.1 g/dL (ref 3.8–4.9)
Alkaline Phosphatase: 136 IU/L — ABNORMAL HIGH (ref 44–121)
BUN/Creatinine Ratio: 21 (ref 9–23)
BUN: 18 mg/dL (ref 6–24)
Bilirubin Total: 0.2 mg/dL (ref 0.0–1.2)
CO2: 19 mmol/L — ABNORMAL LOW (ref 20–29)
Calcium: 8.9 mg/dL (ref 8.7–10.2)
Chloride: 107 mmol/L — ABNORMAL HIGH (ref 96–106)
Creatinine, Ser: 0.87 mg/dL (ref 0.57–1.00)
Globulin, Total: 2.8 g/dL (ref 1.5–4.5)
Glucose: 90 mg/dL (ref 70–99)
Potassium: 3.5 mmol/L (ref 3.5–5.2)
Sodium: 142 mmol/L (ref 134–144)
Total Protein: 6.9 g/dL (ref 6.0–8.5)
eGFR: 80 mL/min/{1.73_m2} (ref >59)

## 2023-12-06 LAB — HEMOGLOBIN A1C
Est. average glucose Bld gHb Est-mCnc: 140 mg/dL
Hgb A1c MFr Bld: 6.5 % — ABNORMAL HIGH (ref 4.8–5.6)

## 2023-12-06 LAB — TSH+FREE T4
Free T4: 0.91 ng/dL (ref 0.82–1.77)
TSH: 2.95 u[IU]/mL (ref 0.450–4.500)

## 2023-12-06 LAB — LIPID PANEL
Chol/HDL Ratio: 7.3 ratio — ABNORMAL HIGH (ref 0.0–4.4)
Cholesterol, Total: 154 mg/dL (ref 100–199)
HDL: 21 mg/dL — ABNORMAL LOW (ref >39)
LDL Chol Calc (NIH): 105 mg/dL — ABNORMAL HIGH (ref 0–99)
Triglycerides: 155 mg/dL — ABNORMAL HIGH (ref 0–149)
VLDL Cholesterol Cal: 28 mg/dL (ref 5–40)

## 2023-12-06 LAB — B12 AND FOLATE PANEL
Folate: 9.2 ng/mL (ref >3.0)
Vitamin B-12: 320 pg/mL (ref 232–1245)

## 2023-12-07 ENCOUNTER — Other Ambulatory Visit: Payer: Self-pay | Admitting: Internal Medicine

## 2023-12-07 DIAGNOSIS — R000 Tachycardia, unspecified: Secondary | ICD-10-CM

## 2023-12-08 ENCOUNTER — Ambulatory Visit: Payer: Self-pay | Admitting: Internal Medicine

## 2023-12-08 ENCOUNTER — Other Ambulatory Visit: Payer: Self-pay | Admitting: Internal Medicine

## 2023-12-08 DIAGNOSIS — E1169 Type 2 diabetes mellitus with other specified complication: Secondary | ICD-10-CM

## 2023-12-08 MED ORDER — ATORVASTATIN CALCIUM 40 MG PO TABS: 40.0000 mg | ORAL_TABLET | Freq: Every day | ORAL | 3 refills | Status: AC

## 2023-12-24 ENCOUNTER — Telehealth: Payer: Self-pay | Admitting: Internal Medicine

## 2023-12-24 NOTE — Telephone Encounter (Signed)
 Staff Health Assessment Noted Copied Scanned Original in provider box Copy at front desk

## 2023-12-29 NOTE — Telephone Encounter (Signed)
 Erin Murray the spouse picked up the form

## 2023-12-31 NOTE — Assessment & Plan Note (Signed)
 Remains adequately controlled on current regimen of metoprolol  tartrate and lisinopril .  Lisinopril  has been refilled today.

## 2023-12-31 NOTE — Assessment & Plan Note (Signed)
 Lipid panel last updated in November 2023.  Total cholesterol 186 and LDL 123.  Atorvastatin  20 mg daily was started in light of this result.  Repeat lipid panel ordered today.

## 2023-12-31 NOTE — Assessment & Plan Note (Signed)
 A1c 6.5 on labs from June 2024.  She is currently attempting to control diabetes with diet alone but expresses frustration over recent weight gain.  Through shared decision making, metformin  500 mg daily and Ozempic  0.25 mg weekly has been prescribed today.  Repeat A1c is pending.

## 2023-12-31 NOTE — Assessment & Plan Note (Signed)
 Current weight 217 pounds.  BMI 35.1.  She has gained 7 pounds since March despite adhering to healthy dieting and exercise habits.  As otherwise documented, Ozempic  0.25 mg weekly has been prescribed today in the setting of diabetes mellitus and obesity.  Follow-up in 3 months for reassessment.

## 2024-01-01 ENCOUNTER — Telehealth: Payer: Self-pay | Admitting: Internal Medicine

## 2024-01-01 NOTE — Telephone Encounter (Unsigned)
 Copied from CRM [id number]. Topic: Clinical - Medication Refill >> Jan 01, 2024  1:00 PM Chrystal Crape R wrote: Medication: Semaglutide ,0.25 or 0.5MG /DOS, 2 MG/3ML Erin Murray [[id number]]  Has the patient contacted their pharmacy? Yes (Agent: If no, request that the patient contact the pharmacy for the refill. If patient does not wish to contact the pharmacy document the reason why and proceed with request.) (Agent: If yes, when and what did the pharmacy advise?)  This is the patient's preferred pharmacy:   [facility] [address], [place] - [address] [name] [address] Phone: [phone] Fax: [phone]  Is this the correct pharmacy for this prescription? Yes If no, delete pharmacy and type the correct one.   Has the prescription been filled recently? Yes  Is the patient out of the medication? Yes  Has the patient been seen for an appointment in the last year OR does the patient have an upcoming appointment? Yes  Can we respond through MyChart? Yes  Agent: Please be advised that Rx refills may take up to 3 business days. We ask that you follow-up with your pharmacy.

## 2024-01-27 ENCOUNTER — Encounter: Payer: Self-pay | Admitting: Nurse Practitioner

## 2024-01-27 ENCOUNTER — Ambulatory Visit: Payer: Self-pay | Admitting: Nurse Practitioner

## 2024-01-27 VITALS — BP 92/59 | HR 96 | Ht 66.0 in | Wt 208.4 lb

## 2024-01-27 DIAGNOSIS — E119 Type 2 diabetes mellitus without complications: Secondary | ICD-10-CM

## 2024-01-27 DIAGNOSIS — K061 Gingival enlargement: Secondary | ICD-10-CM | POA: Diagnosis not present

## 2024-01-27 DIAGNOSIS — K1379 Other lesions of oral mucosa: Secondary | ICD-10-CM

## 2024-01-27 MED ORDER — SEMAGLUTIDE (1 MG/DOSE) 4 MG/3ML ~~LOC~~ SOPN
1.0000 mg | PEN_INJECTOR | SUBCUTANEOUS | 4 refills | Status: DC
Start: 2024-01-27 — End: 2024-01-27

## 2024-01-27 MED ORDER — SEMAGLUTIDE (1 MG/DOSE) 4 MG/3ML ~~LOC~~ SOPN: 1.0000 mg | PEN_INJECTOR | SUBCUTANEOUS | 4 refills | Status: DC

## 2024-01-27 NOTE — Addendum Note (Signed)
 Addended by: Lois Ostrom on: 01/27/2024 09:24 AM   Modules accepted: Orders

## 2024-01-27 NOTE — Patient Instructions (Addendum)
 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

## 2024-01-27 NOTE — Progress Notes (Signed)
 Established Patient Office Visit  Subjective:  Patient ID: Erin Murray, female    DOB: 09-16-1970  Age: 53 y.o. 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  She saw a dentist 4 years earlier for this concern.  She had an incision bx on July 2nd by an oral surgeon in [facility], the bx had no conclusive results and recommended an excision bx.  Patient wishes not to return to that oral surgeon.  She also would like to increase her dose of semaglutide .  Also family hx of cancer includes:  Dad, colon CA in his 20's.  Sister, breast CA at 53 yrs of age.  Another sister with female CA and colon CA in her 22's.    Diabetes    No other concerns at this time.   Past Medical History:  Diagnosis Date   Complicated migraine    includes: visual changes, left sided weakness, confusion, weakness, dizziness   Dysrhythmia    Fibromyalgia    Fibromyalgia    GERD (gastroesophageal reflux disease)    Headache(784.0)    Hypertension    Neurological disorder    unnamed neurological disorder   Reflux    Seizures (HCC)    Tachycardia    TIA (transient ischemic attack) 07/15/2008    Past Surgical History:  Procedure Laterality Date   BIOPSY  08/19/2022   Procedure: BIOPSY;  Surgeon: Cindie Carlin POUR, DO;  [facility];  Service: Endoscopy;;   CHOLECYSTECTOMY     COLONOSCOPY     2015 per patient. done in [place], [place].   COLONOSCOPY WITH PROPOFOL  N/A 08/19/2022   Procedure: COLONOSCOPY WITH PROPOFOL ;  Surgeon: Cindie Carlin POUR, DO;  [facility];  Service: Endoscopy;  Laterality: N/A;  10:15am, asa 3   ESOPHAGOGASTRODUODENOSCOPY N/A  10/14/2014   Procedure: ESOPHAGOGASTRODUODENOSCOPY (EGD);  Surgeon: Claudis RAYMOND Rivet, MD;  [facility];  Service: Endoscopy;  Laterality: N/A;  210 - moved to 12:45 - Ann to notify pt   ESOPHAGOGASTRODUODENOSCOPY (EGD) WITH PROPOFOL  N/A 08/19/2022   Procedure: ESOPHAGOGASTRODUODENOSCOPY (EGD) WITH PROPOFOL ;  Surgeon: Cindie Carlin POUR, DO;  [facility];  Service: Endoscopy;  Laterality: N/A;   OVARIAN CYST DRAINAGE     POLYPECTOMY  08/19/2022   Procedure: POLYPECTOMY;  Surgeon: Cindie Carlin POUR, DO;  [facility];  Service: Endoscopy;;   TUBAL LIGATION      Social History   Socioeconomic History   Marital status: Married    Spouse name: [name]   Number of children: 3   Years of education: Not on file   Highest education level: Not on file  Occupational History   Occupation: day care  Tobacco Use   Smoking status: Never    Passive exposure: Never   Smokeless tobacco: Never  Vaping Use   Vaping status: Never Used  Substance and Sexual Activity   Alcohol use: No   Drug use: No   Sexual activity: Yes    Birth control/protection: Surgical  Other Topics Concern   Not on file  Social History Narrative   Patient lives at home with  her husband [name]), three children. Patient works at at a day care. Patient has three children. 53 years old.   Left handed.   Caffeine two cups of coffee daily   Social Drivers of Corporate investment banker Strain: Not on file  Food Insecurity: Not on file  Transportation Needs: Not on file  Physical Activity: Not on file  Stress: Not on file  Social Connections: Unknown (11/23/2021)   Received from [facility]   Social Network    Social Network: Not on file  Intimate Partner Violence: Unknown (10/16/2021)   Received from [facility]   HITS    Physically Hurt: Not on file    Insult or Talk Down To: Not on file    Threaten Physical Harm: Not on file    Scream or Curse: Not on file    Family History  Problem  Relation Age of Onset   High Cholesterol Mother    Asthma Mother    High Cholesterol Father    Heart disease Father    Colon cancer Father 6   Colon cancer Sister    Breast cancer Sister    Cancer Maternal Uncle    Lung cancer Maternal Uncle    COPD Maternal Grandfather     Allergies  Allergen Reactions   Sulfa Antibiotics Nausea And Vomiting and Other (See Comments)    REACTION: Family members almost died taking this medication   Penicillins Hives    Outpatient Medications Prior to Visit  Medication Sig   aspirin EC 81 MG tablet Take 81 mg by mouth daily.   atorvastatin  (LIPITOR) 40 MG tablet Take 1 tablet (40 mg total) by mouth daily.   cyclobenzaprine  (FLEXERIL ) 10 MG tablet Take 1 tablet (10 mg total) by mouth 3 (three) times daily as needed for muscle spasms. Do not drink alcohol or drive while taking this medication.  May cause drowsiness.   fluticasone  (FLONASE ) 50 MCG/ACT nasal spray Place 1 spray into both nostrils 2 (two) times daily.   lisinopril  (ZESTRIL ) 5 MG tablet Take 1 tablet (5 mg total) by mouth daily.   melatonin 5 MG TABS Take 20 mg by mouth at bedtime as needed.   metFORMIN  (GLUCOPHAGE -XR) 500 MG 24 hr tablet Take 1 tablet (500 mg total) by mouth daily with breakfast.   metoprolol  tartrate (LOPRESSOR ) 50 MG tablet Take 1 tablet by mouth twice daily   omeprazole  (PRILOSEC) 40 MG capsule Take 40 mg by mouth daily.   ondansetron  (ZOFRAN ) 4 MG tablet Take 1 tablet (4 mg total) by mouth every 8 (eight) hours as needed for nausea or vomiting.   rizatriptan  (MAXALT -MLT) 10 MG disintegrating tablet Take 1 tablet (10 mg total) by mouth as needed for migraine. May repeat in 2 hours if needed   Semaglutide ,0.25 or 0.5MG /DOS, 2 MG/3ML SOPN Inject 0.25 mg into the skin once a week.   topiramate  (TOPAMAX ) 50 MG tablet TAKE 1 TABLET BY MOUTH TWICE DAILY FOR 7 DAYS THEN  INCREASE  TO  2  TABLETS  BY  MOUTH  TWICE  DAILY   vitamin B-12 (CYANOCOBALAMIN ) 1000 MCG tablet Take  5,000 mcg by mouth daily.   No facility-administered medications prior to visit.    ROS     Objective:   BP (!) 92/59   Pulse 96   Ht 5' 6 (1.676 m)   Wt 208 lb 6.4 oz (94.5 kg)   LMP 08/24/2019   SpO2 98%   BMI 33.64 kg/m   Vitals:  01/27/24 0841  BP: (!) 92/59  Pulse: 96  Height: 5' 6 (1.676 m)  Weight: 208 lb 6.4 oz (94.5 kg)  SpO2: 98%  BMI (Calculated): 33.65    Physical Exam Vitals and nursing note reviewed.  Constitutional:      Appearance: Normal appearance.  HENT:     Head: Normocephalic.     Nose: Nose normal.     Mouth/Throat:     Mouth: Mucous membranes are moist.     Comments: Right sided upper gum mass, erythematous, tender to light palpation, gum covering upper teeth Cardiovascular:     Rate and Rhythm: Normal rate and regular rhythm.  Pulmonary:     Effort: Pulmonary effort is normal.     Breath sounds: Normal breath sounds.  Musculoskeletal:        General: Normal range of motion.     Cervical back: Normal range of motion.  Neurological:     Mental Status: She is alert and oriented to person, place, and time.  Psychiatric:        Mood and Affect: Mood normal.        Behavior: Behavior normal.      No results found for any visits on 01/27/24.  Recent Results (from the past 2160 hours)  B12 and Folate Panel     Status: None   Collection Time: 12/05/23  3:56 PM  Result Value Ref Range   Vitamin B-12 320 232 - 1,245 pg/mL   Folate 9.2 >3.0 ng/mL    Comment: A serum folate concentration of less than 3.1 ng/mL is considered to represent clinical deficiency.   VITAMIN D  25 Hydroxy (Vit-D Deficiency, Fractures)     Status: Abnormal   Collection Time: 12/05/23  3:56 PM  Result Value Ref Range   Vit D, 25-Hydroxy 28.6 (L) 30.0 - 100.0 ng/mL    Comment: Vitamin D  deficiency has been defined by the Institute of Medicine and an Endocrine Society practice guideline as a level of serum 25-OH vitamin D  less than 20 ng/mL (1,2). The  Endocrine Society went on to further define vitamin D  insufficiency as a level between 21 and 29 ng/mL (2). 1. IOM (Institute of Medicine). 2010. Dietary reference    intakes for calcium  and D. [place]: The    [employer]. 2. Holick MF, Binkley Newbern, Bischoff-Ferrari HA, et al.    Evaluation, treatment, and prevention of vitamin D     deficiency: an Endocrine Society clinical practice    guideline. JCEM. 2011 Jul; 96(7):1911-30.   TSH + free T4     Status: None   Collection Time: 12/05/23  3:56 PM  Result Value Ref Range   TSH 2.950 0.450 - 4.500 uIU/mL   Free T4 0.91 0.82 - 1.77 ng/dL  Hemoglobin J8r     Status: Abnormal   Collection Time: 12/05/23  3:56 PM  Result Value Ref Range   Hgb A1c MFr Bld 6.5 (H) 4.8 - 5.6 %    Comment:          Prediabetes: 5.7 - 6.4          Diabetes: >6.4          Glycemic control for adults with diabetes: <7.0    Est. average glucose Bld gHb Est-mCnc 140 mg/dL  Lipid panel     Status: Abnormal   Collection Time: 12/05/23  3:56 PM  Result Value Ref Range   Cholesterol, Total 154 100 - 199 mg/dL   Triglycerides 844 (H) 0 -  149 mg/dL   HDL 21 (L) >60 mg/dL   VLDL Cholesterol Cal 28 5 - 40 mg/dL   LDL Chol Calc (NIH) 894 (H) 0 - 99 mg/dL   Chol/HDL Ratio 7.3 (H) 0.0 - 4.4 ratio    Comment:                                   T. Chol/HDL Ratio                                             Men  Women                               1/2 Avg.Risk  3.4    3.3                                   Avg.Risk  5.0    4.4                                2X Avg.Risk  9.6    7.1                                3X Avg.Risk 23.4   11.0   CMP14+EGFR     Status: Abnormal   Collection Time: 12/05/23  3:56 PM  Result Value Ref Range   Glucose 90 70 - 99 mg/dL   BUN 18 6 - 24 mg/dL   Creatinine, Ser 9.12 0.57 - 1.00 mg/dL   eGFR 80 >40 fO/fpw/8.26   BUN/Creatinine Ratio 21 9 - 23   Sodium 142 134 - 144 mmol/L   Potassium 3.5 3.5 - 5.2 mmol/L   Chloride  107 (H) 96 - 106 mmol/L   CO2 19 (L) 20 - 29 mmol/L   Calcium  8.9 8.7 - 10.2 mg/dL   Total Protein 6.9 6.0 - 8.5 g/dL   Albumin 4.1 3.8 - 4.9 g/dL   Globulin, Total 2.8 1.5 - 4.5 g/dL   Bilirubin Total 0.2 0.0 - 1.2 mg/dL   Alkaline Phosphatase 136 (H) 44 - 121 IU/L   AST 23 0 - 40 IU/L   ALT 23 0 - 32 IU/L  CBC with Differential/Platelet     Status: None   Collection Time: 12/05/23  3:56 PM  Result Value Ref Range   WBC 6.0 3.4 - 10.8 x10E3/uL   RBC 4.96 3.77 - 5.28 x10E6/uL   Hemoglobin 13.3 11.1 - 15.9 g/dL   Hematocrit 58.1 65.9 - 46.6 %   MCV 84 79 - 97 fL   MCH 26.8 26.6 - 33.0 pg   MCHC 31.8 31.5 - 35.7 g/dL   RDW 86.2 88.2 - 84.5 %   Platelets 290 150 - 450 x10E3/uL   Neutrophils 50 Not Estab. %   Lymphs 38 Not Estab. %   Monocytes 9 Not Estab. %   Eos 2 Not Estab. %   Basos 1 Not Estab. %   Neutrophils Absolute 3.1 1.4 - 7.0 x10E3/uL   Lymphocytes Absolute 2.3 0.7 - 3.1 x10E3/uL   Monocytes  Absolute 0.5 0.1 - 0.9 x10E3/uL   EOS (ABSOLUTE) 0.1 0.0 - 0.4 x10E3/uL   Basophils Absolute 0.0 0.0 - 0.2 x10E3/uL   Immature Granulocytes 0 Not Estab. %   Immature Grans (Abs) 0.0 0.0 - 0.1 x10E3/uL      Assessment & Plan:   Problem List Items Addressed This Visit   None Visit Diagnoses       Gum hyperplasia    -  Primary   Relevant Orders   Ambulatory referral to Oral Maxillofacial Surgery       No follow-ups on file.   Total time spent: 20 minutes  Neale Carpen, NP  01/27/2024   This document may have been prepared by [facility] Voice Recognition software and as such may include unintentional dictation errors.

## 2024-01-30 ENCOUNTER — Ambulatory Visit: Payer: Self-pay

## 2024-01-30 NOTE — Telephone Encounter (Signed)
 FYI Only or Action Required?: Action required by provider: clinical question for provider and update on patient condition.  Patient was last seen in primary care on 01/27/2024 by Glennon Sand, NP.  Called Nurse Triage reporting Oral Pain.  Symptoms began several days ago.  Interventions attempted: OTC medications: Tylenol  and Ibuprofen   and Prescription medications: Topamax  and Flexeril .  Symptoms are: unchanged. Endorsing pain when trying to eat. Unsure if its her jaw or gum as discussed in OV on 4/15.   Triage Disposition: See HCP Within 4 Hours (Or PCP Triage)  Patient/caregiver understands and will follow disposition?: No, wishes to speak with PCP- Referral for Oral Surgeon still pending. Patient asking if she can have something for pain control in the meantime. Current medications are not helping.     Copied from CRM [id number]. Topic: Clinical - Red Word Triage >> Jan 30, 2024  4:00 PM Delon HERO wrote: Red Word that prompted transfer to Nurse Triage: Patient is calling to report that she is awaiting referral to be sent for Referral to oncologist for mass in mouth. Reporting in some pain. No pain medication given. Reason for Disposition  [1] SEVERE mouth pain (e.g., excruciating) AND [2] not improved after 2 hours of pain medicine  Answer Assessment - Initial Assessment Questions 1. ONSET: When did your mouth start hurting? (e.g., hours or days ago)      Several days  2. SEVERITY: How bad is the pain? (Scale 1-10; or mild, moderate or severe)     Moderate pain when trying to eat and talk  3. SORES: Are there any sores or ulcers in the mouth? If Yes, ask: What part of the mouth are the sores in?     Gum hyperplasia  4. FEVER: Do you have a fever? If Yes, ask: What is your temperature, how was it measured, and when did it start?     No fever  5. CAUSE: What do you think is causing the mouth pain?     Unsure if its her jaw or her gums  6. OTHER SYMPTOMS: Do  you have any other symptoms? (e.g., difficulty breathing)     Gets a headache when trying to eat.  Protocols used: Mouth Pain-A-AH

## 2024-02-02 ENCOUNTER — Other Ambulatory Visit: Payer: Self-pay

## 2024-02-03 ENCOUNTER — Other Ambulatory Visit: Payer: Self-pay

## 2024-02-03 DIAGNOSIS — K061 Gingival enlargement: Secondary | ICD-10-CM

## 2024-02-03 MED ORDER — LIDOCAINE VISCOUS HCL 2 % MT SOLN: 5.0000 mL | Freq: Four times a day (QID) | OROMUCOSAL | 0 refills | Status: AC | PRN

## 2024-02-04 ENCOUNTER — Telehealth: Payer: Self-pay | Admitting: Nurse Practitioner

## 2024-02-04 NOTE — Telephone Encounter (Signed)
 Copied from CRM [id number]. Topic: Referral - Status >> Feb 04, 2024  9:21 AM Treva T wrote: Reason for CRM: Received call from patient, calling to check status of referral to [facility] surgeon for gum hyperplasia.  Per chart review status of referral is pending review.   Patient is requesting a follow up call as soon as possible to inform of status, and details of referral to Oral Surgeon at [facility]. Patient states she is currently at [facility] with her spouse, and can go to office to schedule appointment, however she needs to know where the referral was sent.   Patient requesting a return call as soon as possible with details. Can be reached at [phone]

## 2024-02-04 NOTE — Telephone Encounter (Signed)
Pt informed

## 2024-02-05 NOTE — Telephone Encounter (Signed)
 Hey,  Referrals do not handle Oral Surgery as these referrals involve Dental Insurance which we do not process (we only handle medical insurance).  & with most Oral Surgery Office's -they prefer the Referral to come from Patient's Primary Dental Office.

## 2024-02-06 ENCOUNTER — Encounter: Payer: Self-pay | Admitting: Nurse Practitioner

## 2024-02-27 ENCOUNTER — Telehealth: Payer: Self-pay | Admitting: Nurse Practitioner

## 2024-02-27 NOTE — Telephone Encounter (Signed)
 Can you please update patient's referral to ENT?

## 2024-02-27 NOTE — Telephone Encounter (Signed)
 Copied from CRM #[id number]. Topic: Appointments - Appointment Info/Confirmation >> Feb 27, 2024  9:50 AM Sophia H wrote: Patient/patient representative is calling for information regarding an appointment.   Originally called in to see what upcoming appt is for, advised 41m follow up/med check since starting ozempic  back in May. Patient also had questions regarding referral that she states Is for ENT. I read notes in chart stating we do not handle oral surgery referrals, another referral was placed at her request for Dr. Lauralee (ENT) on 7/28.  Patient was still arguing that the referral is not for oral surgery despite letting her know both requests were sent, states she would like to cancel her upcoming appointment since there is no point in coming in since the office cannot place a simple referral. Advised patient I would reach out to office for clarification and patient became upset, states I don't give a damn about her since I'm in a contact center and all I can do is call over and hope someone comes on the line or send a message. Stated I need to go to the referrals department and make them call in her referral the way she asked. Patient very adamant wants to speak with the office now. Called [facility] - was on hold for office manager but patient disconnected. Please reach out, patient very frustrated. Ty   Cancelled per patients request - no point in coming in   Mar 05 2024 04:20 PM - Office Visit [facility] - Leita Longs, FNP

## 2024-03-04 ENCOUNTER — Telehealth: Payer: Self-pay

## 2024-03-04 NOTE — Telephone Encounter (Signed)
 Copied from CRM #[id number]. Topic: Clinical - Medical Advice >> Mar 03, 2024 12:07 PM Nathanel BROCKS wrote: Reason for CRM: pt called and was extremely upset and ended up hanging up on me. She is having problems with the referral that [facility] gave her to a specialist for something growing in her mouth. She states that no one will help her. Could someone please give her a call.

## 2024-03-05 ENCOUNTER — Ambulatory Visit

## 2024-03-05 NOTE — Telephone Encounter (Signed)
 Please refer back to 7/25 MyChart Message .SABRA  I sent a response to Patient on 8/19.  I spoke with Olam from ENT Referral Scheduling - PER Olam she is unable to schedule Patient with current DX listed.  I told her that Patient stated she was seen with [facility] whom stated she needed to be seen at this ENT office and Olam states she has looked through Patient's [facility] Chart and cannot find anything listed as to why she would need to be seen at their Office :(

## 2024-03-08 NOTE — Telephone Encounter (Signed)
 Message sent to patient

## 2024-04-22 ENCOUNTER — Other Ambulatory Visit: Payer: Self-pay | Admitting: Internal Medicine

## 2024-04-22 DIAGNOSIS — R000 Tachycardia, unspecified: Secondary | ICD-10-CM

## 2024-04-28 ENCOUNTER — Encounter (INDEPENDENT_AMBULATORY_CARE_PROVIDER_SITE_OTHER): Payer: Self-pay | Admitting: Gastroenterology

## 2024-05-11 ENCOUNTER — Ambulatory Visit (INDEPENDENT_AMBULATORY_CARE_PROVIDER_SITE_OTHER): Admitting: Gastroenterology

## 2024-05-11 ENCOUNTER — Other Ambulatory Visit (HOSPITAL_COMMUNITY)
Admission: RE | Admit: 2024-05-11 | Discharge: 2024-05-11 | Disposition: A | Discharge status: Home / Self Care | Source: Ambulatory Visit | Attending: Gastroenterology | Admitting: Gastroenterology

## 2024-05-11 ENCOUNTER — Encounter: Payer: Self-pay | Admitting: Gastroenterology

## 2024-05-11 ENCOUNTER — Telehealth: Payer: Self-pay | Admitting: *Deleted

## 2024-05-11 VITALS — BP 114/75 | HR 75 | Temp 97.5°F | Ht 66.0 in | Wt 192.6 lb

## 2024-05-11 DIAGNOSIS — K219 Gastro-esophageal reflux disease without esophagitis: Secondary | ICD-10-CM | POA: Diagnosis not present

## 2024-05-11 DIAGNOSIS — R140 Abdominal distension (gaseous): Secondary | ICD-10-CM | POA: Diagnosis not present

## 2024-05-11 DIAGNOSIS — R112 Nausea with vomiting, unspecified: Secondary | ICD-10-CM

## 2024-05-11 DIAGNOSIS — K529 Noninfective gastroenteritis and colitis, unspecified: Secondary | ICD-10-CM | POA: Insufficient documentation

## 2024-05-11 DIAGNOSIS — K3189 Other diseases of stomach and duodenum: Secondary | ICD-10-CM

## 2024-05-11 LAB — COMPREHENSIVE METABOLIC PANEL WITH GFR
ALT: 12 U/L (ref 0–44)
AST: 13 U/L — ABNORMAL LOW (ref 15–41)
Albumin: 4.3 g/dL (ref 3.5–5.0)
Alkaline Phosphatase: 117 U/L (ref 38–126)
Anion gap: 12 (ref 5–15)
BUN: 20 mg/dL (ref 6–20)
CO2: 23 mmol/L (ref 22–32)
Calcium: 9.2 mg/dL (ref 8.9–10.3)
Chloride: 107 mmol/L (ref 98–111)
Creatinine, Ser: 0.78 mg/dL (ref 0.44–1.00)
GFR, Estimated: >60 mL/min (ref >60)
Glucose, Bld: 92 mg/dL (ref 70–99)
Potassium: 4 mmol/L (ref 3.5–5.1)
Sodium: 141 mmol/L (ref 135–145)
Total Bilirubin: 0.4 mg/dL (ref 0.0–1.2)
Total Protein: 7.3 g/dL (ref 6.5–8.1)

## 2024-05-11 LAB — C-REACTIVE PROTEIN: CRP: 0.6 mg/dL (ref <1.0)

## 2024-05-11 MED ORDER — ONDANSETRON HCL 4 MG PO TABS: 4.0000 mg | ORAL_TABLET | Freq: Two times a day (BID) | ORAL | 1 refills | Status: AC | PRN

## 2024-05-11 MED ORDER — OMEPRAZOLE 40 MG PO CPDR: 40.0000 mg | DELAYED_RELEASE_CAPSULE | Freq: Two times a day (BID) | ORAL | 2 refills | Status: AC

## 2024-05-11 NOTE — Progress Notes (Signed)
 GI Office Note    Referring Provider: No ref. provider found Primary Care Physician:  Glennon Sand, NP (Inactive) Primary Gastroenterologist: Carlin POUR. Cindie, DO  Date:  05/11/2024  ID:  Erin Murray, DOB 1970/08/14, MRN [id number]   Chief Complaint   Chief Complaint  Patient presents with   Diarrhea    Diarrhea and sulfa burps    History of Present Illness  Erin Murray is a 53 y.o. female with a history of GERD, seizures, TIA, fibromyalgia, and fluctuating bowel habits presenting today with complaint of diarrhea and sulfur burps.  Colonoscopy 2011: -normal colonoscopy -advised repeat in 5 years   CT A/P with contrast 02/17/2022: -Stable mild biliary prominence status post cholecystectomy -Normal pancreas, spleen, adrenal glands -No bowel wall thickening or inflammatory changes -Small uterine fundal fibroid no adnexal mass   Labs 06/04/22: Alk phos 150, normal LFTs, CBC unremarkable. A1c 6.4. Triglycerides 157, LDL 123, HDL 35. TSH wnl.   Last office visit 08/08/2022.  Presented with complaints of left upper quadrant pain with eating but states a good appetite and the pain does not keep her from eating.  Having with anything that she eats or drinks.  Taking pantoprazole  40 mg twice daily.  Had a cardiac workup that was negative.  No unintentional weight loss, nausea, or vomiting.  Having fluctuating bowel habits of constipation and diarrhea, gets diarrhea about every 6 weeks sometimes sooner.  Had recently to switch PCPs and PCP had concern for possible IBS-C.  Dad passed of colon cancer and sister also had colon cancer.  Reports history of 3 TIAs.  Scheduled for EGD and colonoscopy.  Continue PPI twice daily, use famotidine as needed when having left upper quadrant pain.  Advised to start MiraLAX 1 capful every 2 to 3 days and start daily fiber supplement.  EGD 08/23/2022: - Z- line regular, 36 cm from the incisors.  - Gastritis. Biopsied.  - Multiple gastric polyps.  -  Normal duodenal bulb, first portion of the duodenum and second portion of the duodenum. -Advise continue Pepcid 20 mg daily - Path: Mild chronic gastritis, negative for H. pylori. - Advise trial of PPI?  Colonoscopy 08/23/2022: - One 5 mm polyp in the sigmoid colon, removed with a cold snare. Resected and retrieved.  - One 4 mm polyp in the ascending colon, removed with a cold snare. Resected and retrieved. - The examination was otherwise normal. - Path: Tubular adenoma and hyperplastic polyp - He does repeat colonoscopy in 5 years  C. difficile testing in August 2025 with negative C. difficile toxin however C. difficile PCR states he confirmatory test result, unclear if this means it was positive.  Labs at that time with normal CBC and lipase.  Potassium low at 2.9, alk phos 128, other LFTs within normal limits.  Today:  Discussed the use of AI scribe software for clinical note transcription with the patient, who gave verbal consent to proceed.  She has been experiencing chronic diarrhea for several months, with episodes occurring 8 to 12 times a day if not managed with Kaopectate. The diarrhea is watery. She has experienced weight loss and decreased appetite although also has been on ozempic  (symptoms started prior to this). She has been hospitalized multiple times for dehydration, requiring IV fluids. She has tried Imodium without significant relief, preferring Kaopectate, which helps firm her stools and reduce the frequency of diarrhea to once a day. She has undergone testing for C. difficile twice which she reports has been negative.  She has attempted a gluten-free diet with minimal improvement, still experiencing diarrhea 3 to 4 times a day. No specific dietary triggers have been identified, and there is no recent antibiotic use. Denies melena or brbpr.   She experiences occasional nausea and vomiting, particularly when food seems to 'sit' in her stomach, causing discomfort and sometimes  leading to regurgitation. This occurs approximately twice a month. She describes a sensation of food moving sluggishly through her stomach, which sometimes results in visible abdominal distention in the epigastric region.   Her past medical history includes diverticulitis and a cholecystectomy in 2016 due to a sluggish gallbladder. She takes 40 mg of omeprazole  at night for gastrointestinal symptoms, which she purchases over the counter. She is currently taking Ozempic  for diabetes, which she believes contributes to her weight loss. She previously tried Mounjaro , which worsened her diarrhea, leading to its discontinuation.  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 She expresses frustration with the impact of her symptoms on her quality of life.      Wt Readings from Last 6 Encounters:  05/11/24 192 lb 9.6 oz (87.4 kg)  01/27/24 208 lb 6.4 oz (94.5 kg)  12/04/23 217 lb 9.6 oz (98.7 kg)  09/29/23 210 lb (95.3 kg)  09/02/23 211 lb (95.7 kg)  09/01/23 206 lb (93.4 kg)    Body mass index is 31.09 kg/m.   Current Outpatient Medications  Medication Sig Dispense Refill   aspirin EC 81 MG tablet Take 81 mg by mouth daily.     atorvastatin  (LIPITOR) 40 MG tablet Take 1 tablet (40 mg total) by mouth daily. 90 tablet 3   cyclobenzaprine  (FLEXERIL ) 10 MG tablet Take 1 tablet (10 mg total) by mouth 3 (three) times daily as needed for muscle spasms. Do not drink alcohol or drive while taking this medication.  May cause drowsiness. 15 tablet 0   fluticasone  (FLONASE ) 50 MCG/ACT nasal spray Place 1 spray into both nostrils 2 (two) times daily. 16 g 2   gabapentin  (NEURONTIN ) 300 MG capsule Take 300 mg by mouth 3 (three) times daily.     lisinopril  (ZESTRIL ) 5 MG tablet Take 1 tablet (5 mg total) by mouth daily. 90 tablet 3   magic mouthwash (lidocaine , diphenhydrAMINE, alum & mag hydroxide) suspension Swish and spit 5 mLs 4  (four) times daily as needed for mouth pain. 360 mL 0   melatonin 5 MG TABS Take 20 mg by mouth at bedtime as needed.     metoprolol  tartrate (LOPRESSOR ) 50 MG tablet Take 1 tablet by mouth twice daily 60 tablet 0   ondansetron  (ZOFRAN ) 4 MG tablet Take 1 tablet (4 mg total) by mouth every 8 (eight) hours as needed for nausea or vomiting. 20 tablet 0   rizatriptan  (MAXALT -MLT) 10 MG disintegrating tablet Take 1 tablet (10 mg total) by mouth as needed for migraine. May repeat in 2 hours if needed 15 tablet 6   topiramate  (TOPAMAX ) 50 MG tablet TAKE 1 TABLET BY MOUTH TWICE DAILY FOR 7 DAYS THEN  INCREASE  TO  2  TABLETS  BY  MOUTH  TWICE  DAILY 120 tablet 0   vitamin B-12 (CYANOCOBALAMIN ) 1000 MCG tablet Take 5,000 mcg by mouth daily.     metFORMIN  (GLUCOPHAGE -XR) 500 MG 24 hr tablet Take 1 tablet (500 mg total) by mouth daily with breakfast. (Patient not taking: Reported on 05/11/2024) 90 tablet 1   omeprazole  (PRILOSEC) 40 MG  capsule Take 40 mg by mouth daily. (Patient not taking: Reported on 05/11/2024)     Semaglutide , 1 MG/DOSE, 4 MG/3ML SOPN Inject 1 mg as directed once a week. 3 mL 4   No current facility-administered medications for this visit.    Past Medical History:  Diagnosis Date   Complicated migraine    includes: visual changes, left sided weakness, confusion, weakness, dizziness   Dysrhythmia    Fibromyalgia    Fibromyalgia    GERD (gastroesophageal reflux disease)    Headache(784.0)    Hypertension    Neurological disorder    unnamed neurological disorder   Reflux    Seizures (HCC)    Tachycardia    TIA (transient ischemic attack) 07/15/2008    Past Surgical History:  Procedure Laterality Date   BIOPSY  08/19/2022   Procedure: BIOPSY;  Surgeon: Cindie Carlin POUR, DO;  [facility]: [facility];  Service: Endoscopy;;   CHOLECYSTECTOMY     COLONOSCOPY     2015 per patient. done in [place], [place].   COLONOSCOPY WITH PROPOFOL  N/A 08/19/2022   Procedure: COLONOSCOPY WITH  PROPOFOL ;  Surgeon: Cindie Carlin POUR, DO;  [facility]: [facility];  Service: Endoscopy;  Laterality: N/A;  10:15am, asa 3   ESOPHAGOGASTRODUODENOSCOPY N/A 10/14/2014   Procedure: ESOPHAGOGASTRODUODENOSCOPY (EGD);  Surgeon: Claudis RAYMOND Rivet, MD;  Location: [facility];  Service: Endoscopy;  Laterality: N/A;  210 - moved to 12:45 - Ann to notify pt   ESOPHAGOGASTRODUODENOSCOPY (EGD) WITH PROPOFOL  N/A 08/19/2022   Procedure: ESOPHAGOGASTRODUODENOSCOPY (EGD) WITH PROPOFOL ;  Surgeon: Cindie Carlin POUR, DO;  [facility];  Service: Endoscopy;  Laterality: N/A;   OVARIAN CYST DRAINAGE     POLYPECTOMY  08/19/2022   Procedure: POLYPECTOMY;  Surgeon: Cindie Carlin POUR, DO;  [facility]: [facility];  Service: Endoscopy;;   TUBAL LIGATION      Family History  Problem Relation Age of Onset   High Cholesterol Mother    Asthma Mother    High Cholesterol Father    Heart disease Father    Colon cancer Father 48   Colon cancer Sister    Breast cancer Sister    Cancer Maternal Uncle    Lung cancer Maternal Uncle    COPD Maternal Grandfather     Allergies as of 05/11/2024 - Review Complete 01/27/2024  Allergen Reaction Noted   Sulfa antibiotics Nausea And Vomiting and Other (See Comments) 02/20/2012   Penicillins Hives 02/20/2012    Social History   Socioeconomic History   Marital status: Married    Spouse name: [name]   Number of children: 3   Years of education: Not on file   Highest education level: Not on file  Occupational History   Occupation: day care  Tobacco Use   Smoking status: Never    Passive exposure: Never   Smokeless tobacco: Never  Vaping Use   Vaping status: Never Used  Substance and Sexual Activity   Alcohol use: No   Drug use: No   Sexual activity: Yes    Birth control/protection: Surgical  Other Topics Concern   Not on file  Social History Narrative   Patient lives at home with her husband [name]), three children. Patient works at at a day care.  Patient has three children. 71 years old.   Left handed.   Caffeine two cups of coffee daily   Social Drivers of Corporate Investment Banker Strain: Not on file  Food Insecurity: Not on file  Transportation Needs:  Not on file  Physical Activity: Not on file  Stress: Not on file  Social Connections: Unknown (11/23/2021)   Received from [facility]   Social Network    Social Network: Not on file    Review of Systems   Gen: Denies fever, chills, anorexia. Denies fatigue, weakness, weight loss.  CV: Denies chest pain, palpitations, syncope, peripheral edema, and claudication. Resp: Denies dyspnea at rest, cough, wheezing, coughing up blood, and pleurisy. GI: See HPI Derm: Denies rash, itching, dry skin Psych: Denies depression, anxiety, memory loss, confusion. No homicidal or suicidal ideation.  Heme: Denies bruising, bleeding, and enlarged lymph nodes.  Physical Exam   BP 114/75 (BP Location: Right Wrist, Patient Position: Sitting, Cuff Size: Normal)   Pulse 75   Temp (!) 97.5 F (36.4 C) (Temporal)   Ht 5' 6 (1.676 m)   Wt 192 lb 9.6 oz (87.4 kg)   LMP 08/24/2019   BMI 31.09 kg/m   General:   Alert and oriented. No distress noted. Pleasant and cooperative.  Head:  Normocephalic and atraumatic. Eyes:  Conjuctiva clear without scleral icterus. Mouth:  Oral mucosa pink and moist. Good dentition. No lesions. Lungs:  Clear to auscultation bilaterally. No wheezes, rales, or rhonchi. No distress.  Heart:  S1, S2 present without murmurs appreciated.  Abdomen:  +BS, soft, non-tender and non-distended. No rebound or guarding. No HSM or masses noted. Rectal: deferred Msk:  Symmetrical without gross deformities. Normal posture. Extremities:  Without edema. Neurologic:  Alert and  oriented x4 Psych:  Alert and cooperative. Normal mood and affect.  Assessment & Plan  Erin Murray is a 53 y.o. female presenting today with diarrhea and belching.    Chronic diarrhea with  unintentional weight loss, recurrent dehydration, and abdominal pain Chronic diarrhea for several months with 8-12 episodes per day without Kaopectate, associated with unintentional weight loss and recurrent dehydration requiring IV fluids. No significant abdominal pain, but mid-right and middle upper abdominal tenderness on exam. Negative for C. diff. Differential includes microscopic colitis, bile acid diarrhea, IBS-D, pancreatic insufficiency, and Alpha-Gal syndrome. Gluten-free diet trial provided partial relief. Kaopectate reduces bowel movements to once daily with normal stool consistency. - Order stool tests for inflammatory markers and pancreatic insufficiency as well as GI profile for other infection.  - Order blood tests for celiac disease and Alpha-Gal syndrome, check inflammatory markers such as CRP and electrolytes given prior hypokalemia.  - Continue Kaopectate for diarrhea management - dsicussed need for other alternatives in the future (dicyclomine , cholestyramine, Xifaxin) - Monitor for potential need for colonoscopy based on stool test results  GERD with nausea and vomiting GERD with nausea and occasional vomiting, occurring approximately twice a month. Symptoms include food regurgitation and upper abdominal discomfort. Omeprazole  previously used with some relief, currently taking over-the-counter to equal 40 mg daily.  Nausea vomiting could be secondary to possible diabetic gastroparesis as below. - Prescribe omeprazole  40 mg twice daily - Prescribe Zofran  for nausea management - Evaluate for possible gastroparesis.   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 Symptoms occur intermittently and are not associated with specific food types. Gastroparesis suspected due to diabetes and symptoms of delayed gastric  emptying. - Order gastric emptying study to assess for gastroparesis  Suspected celiac disease Suspected celiac disease due to partial improvement with gluten-free diet, though not complete resolution of diarrhea. No prior testing for celiac  disease noted in records. - Order blood test for celiac disease     Follow up   Follow up 4-6 weeks.   Charmaine Melia, MSN, FNP-BC, AGACNP-BC [facility]

## 2024-05-11 NOTE — Patient Instructions (Addendum)
 Please have blood work and stool studies completed at [facility].  We will call you with results once they have been received. Please allow 3-5 business days for review. Locations for [facility] in [place]:              [address], [place]   For now continue taking your Kaopectate as you wish to help with your diarrhea.  We would likely need to make some changes here in the near future pending the results of your lab work and stool studies.  Potential treatment options for your diarrhea include: Cholestyramine, scheduled Imodium, dicyclomine , or Xifaxan.  We will get a gastric emptying study to further evaluate your nausea and vomiting as well as the epigastric bloating/distention that you have with feeling food sitting in your stomach.  I have sent in Zofran  for you to use up to twice a day as needed for nausea and vomiting.  Continue taking your omeprazole  40 mg however I want you to increase this to twice daily, 30 minutes prior to breakfast and dinner.  Follow-up in 4-6 weeks.  It was a pleasure to see you today. I want to create trusting relationships with patients. If you receive a survey regarding your visit,  I greatly appreciate you taking time to fill this out on paper or through your MyChart. I value your feedback.  Charmaine Melia, MSN, FNP-BC, AGACNP-BC [facility]

## 2024-05-11 NOTE — Telephone Encounter (Signed)
 [employer] PA for [facility]:   Pending Authorization #: [id number] Effective: 05/11/2024

## 2024-05-13 ENCOUNTER — Ambulatory Visit: Payer: Self-pay | Admitting: Gastroenterology

## 2024-05-13 LAB — MISC LABCORP TEST (SEND OUT): Labcorp test code: 650003

## 2024-05-14 LAB — MISC LABCORP TEST (SEND OUT): Labcorp test code: 165142

## 2024-05-18 ENCOUNTER — Other Ambulatory Visit (HOSPITAL_COMMUNITY)
Admission: RE | Admit: 2024-05-18 | Discharge: 2024-05-18 | Disposition: A | Discharge status: Home / Self Care | Source: Ambulatory Visit | Attending: Gastroenterology | Admitting: Gastroenterology

## 2024-05-18 ENCOUNTER — Encounter (HOSPITAL_COMMUNITY)
Admission: RE | Admit: 2024-05-18 | Discharge: 2024-05-18 | Disposition: A | Discharge status: Home / Self Care | Source: Ambulatory Visit | Attending: Gastroenterology | Admitting: Gastroenterology

## 2024-05-18 ENCOUNTER — Encounter (HOSPITAL_COMMUNITY): Payer: Self-pay

## 2024-05-18 DIAGNOSIS — R112 Nausea with vomiting, unspecified: Secondary | ICD-10-CM | POA: Diagnosis present

## 2024-05-18 HISTORY — DX: Type 2 diabetes mellitus without complications: E11.9

## 2024-05-18 LAB — GASTROINTESTINAL PANEL BY PCR, STOOL (REPLACES STOOL CULTURE)

## 2024-05-18 MED ORDER — TECHNETIUM TC 99M SULFUR COLLOID
2.0000 | Freq: Once | INTRAVENOUS | Status: AC | PRN
  Administered 2024-05-18: 2 via ORAL

## 2024-05-19 ENCOUNTER — Ambulatory Visit: Payer: Self-pay | Admitting: Gastroenterology

## 2024-05-20 LAB — CALPROTECTIN, FECAL: Calprotectin, Fecal: 267 ug/g — ABNORMAL HIGH (ref 0–120)

## 2024-05-20 LAB — PANCREATIC ELASTASE, FECAL: Pancreatic Elastase-1, Stool: >800 ug Elast./g (ref >200)

## 2024-05-21 ENCOUNTER — Other Ambulatory Visit: Payer: Self-pay

## 2024-05-21 ENCOUNTER — Ambulatory Visit: Payer: Self-pay | Admitting: Gastroenterology

## 2024-05-21 DIAGNOSIS — R000 Tachycardia, unspecified: Secondary | ICD-10-CM

## 2024-05-24 ENCOUNTER — Other Ambulatory Visit: Payer: Self-pay | Admitting: Gastroenterology

## 2024-05-24 ENCOUNTER — Other Ambulatory Visit (HOSPITAL_COMMUNITY)

## 2024-05-24 DIAGNOSIS — K580 Irritable bowel syndrome with diarrhea: Secondary | ICD-10-CM

## 2024-05-24 MED ORDER — RIFAXIMIN 550 MG PO TABS: 550.0000 mg | ORAL_TABLET | Freq: Three times a day (TID) | ORAL | 0 refills | Status: AC

## 2024-06-07 ENCOUNTER — Encounter: Payer: Self-pay | Admitting: Gastroenterology

## 2024-06-07 ENCOUNTER — Telehealth: Payer: Self-pay | Admitting: Gastroenterology

## 2024-06-07 NOTE — Telephone Encounter (Signed)
 Denial letter for Xifaxin received given reports that she has not tried a TCA. She has tried TCAs in the past for her migraines and unable to tolerate the medication given side effects of dizziness, sleepiness, and excessive xerostomia (dry mouth).   Given other studies for evaluation of chronic diarrhea being negative she meets criteria for IBS-D and would benefit from course of Xifaxin therefore will send in appeal letter.   Charmaine Melia, MSN, APRN, FNP-BC, AGACNP-BC [facility]

## 2024-06-16 ENCOUNTER — Other Ambulatory Visit: Payer: Self-pay

## 2024-06-16 ENCOUNTER — Telehealth: Payer: Self-pay | Admitting: *Deleted

## 2024-06-16 DIAGNOSIS — R000 Tachycardia, unspecified: Secondary | ICD-10-CM

## 2024-06-16 NOTE — Telephone Encounter (Signed)
 I did another PA for pt's Xifaxan . Waiting on response.

## 2024-06-16 NOTE — Telephone Encounter (Signed)
 Noted! Thank you

## 2024-06-18 ENCOUNTER — Encounter: Payer: Self-pay | Admitting: *Deleted

## 2024-06-24 ENCOUNTER — Ambulatory Visit: Admitting: Gastroenterology

## 2024-06-28 ENCOUNTER — Encounter: Payer: Self-pay | Admitting: *Deleted

## 2024-07-14 ENCOUNTER — Other Ambulatory Visit: Payer: Self-pay

## 2024-07-14 DIAGNOSIS — R000 Tachycardia, unspecified: Secondary | ICD-10-CM

## 2024-07-23 ENCOUNTER — Other Ambulatory Visit: Payer: Self-pay

## 2024-07-23 DIAGNOSIS — E119 Type 2 diabetes mellitus without complications: Secondary | ICD-10-CM

## 2024-07-23 MED ORDER — SEMAGLUTIDE (1 MG/DOSE) 4 MG/3ML ~~LOC~~ SOPN: 1.0000 mg | PEN_INJECTOR | SUBCUTANEOUS | 2 refills | Status: AC

## 2024-07-26 ENCOUNTER — Other Ambulatory Visit: Payer: Self-pay
